# Patient Record
Sex: Female | Born: 1960 | Race: White | Hispanic: No | Marital: Single | Smoking: Former smoker
Health system: Southern US, Community
[De-identification: ages and names within clinical notes are randomized; demographics above are authoritative.]

## PROBLEM LIST (undated history)

## (undated) DIAGNOSIS — M1990 Unspecified osteoarthritis, unspecified site: Secondary | ICD-10-CM

## (undated) DIAGNOSIS — G4700 Insomnia, unspecified: Secondary | ICD-10-CM

## (undated) DIAGNOSIS — Z87898 Personal history of other specified conditions: Secondary | ICD-10-CM

## (undated) DIAGNOSIS — R42 Dizziness and giddiness: Principal | ICD-10-CM

## (undated) DIAGNOSIS — R112 Nausea with vomiting, unspecified: Secondary | ICD-10-CM

## (undated) DIAGNOSIS — M797 Fibromyalgia: Secondary | ICD-10-CM

## (undated) DIAGNOSIS — Z9889 Other specified postprocedural states: Secondary | ICD-10-CM

## (undated) DIAGNOSIS — R0989 Other specified symptoms and signs involving the circulatory and respiratory systems: Secondary | ICD-10-CM

## (undated) DIAGNOSIS — T7840XA Allergy, unspecified, initial encounter: Secondary | ICD-10-CM

## (undated) HISTORY — DX: Personal history of other specified conditions: Z87.898

## (undated) HISTORY — DX: Allergy, unspecified, initial encounter: T78.40XA

## (undated) HISTORY — DX: Insomnia, unspecified: G47.00

## (undated) HISTORY — DX: Dizziness and giddiness: R42

## (undated) HISTORY — DX: Unspecified osteoarthritis, unspecified site: M19.90

## (undated) HISTORY — DX: Fibromyalgia: M79.7

---

## 1988-01-10 HISTORY — PX: TMJ ARTHROPLASTY: SHX1066

## 1997-09-22 ENCOUNTER — Other Ambulatory Visit: Admission: RE | Admit: 1997-09-22 | Discharge: 1997-09-22 | Payer: Self-pay | Admitting: Obstetrics and Gynecology

## 1997-10-17 ENCOUNTER — Emergency Department (HOSPITAL_COMMUNITY): Admission: EM | Admit: 1997-10-17 | Discharge: 1997-10-17 | Payer: Self-pay | Admitting: Internal Medicine

## 1998-02-14 ENCOUNTER — Inpatient Hospital Stay (HOSPITAL_COMMUNITY): Admission: AD | Admit: 1998-02-14 | Discharge: 1998-02-14 | Payer: Self-pay | Admitting: *Deleted

## 1998-02-14 ENCOUNTER — Encounter: Payer: Self-pay | Admitting: *Deleted

## 1998-04-12 ENCOUNTER — Inpatient Hospital Stay (HOSPITAL_COMMUNITY): Admission: AD | Admit: 1998-04-12 | Discharge: 1998-04-15 | Payer: Self-pay | Admitting: Obstetrics and Gynecology

## 1998-04-13 ENCOUNTER — Encounter (HOSPITAL_COMMUNITY): Admission: RE | Admit: 1998-04-13 | Discharge: 1998-07-12 | Payer: Self-pay | Admitting: Obstetrics and Gynecology

## 1998-04-22 ENCOUNTER — Encounter: Payer: Self-pay | Admitting: Obstetrics and Gynecology

## 1998-04-22 ENCOUNTER — Ambulatory Visit (HOSPITAL_COMMUNITY): Admission: RE | Admit: 1998-04-22 | Discharge: 1998-04-22 | Payer: Self-pay | Admitting: Obstetrics and Gynecology

## 1998-05-06 ENCOUNTER — Other Ambulatory Visit: Admission: RE | Admit: 1998-05-06 | Discharge: 1998-05-06 | Payer: Self-pay | Admitting: Obstetrics and Gynecology

## 1998-11-24 ENCOUNTER — Encounter: Payer: Self-pay | Admitting: Internal Medicine

## 1998-11-24 ENCOUNTER — Ambulatory Visit (HOSPITAL_COMMUNITY): Admission: RE | Admit: 1998-11-24 | Discharge: 1998-11-24 | Payer: Self-pay | Admitting: Internal Medicine

## 1999-05-27 ENCOUNTER — Other Ambulatory Visit: Admission: RE | Admit: 1999-05-27 | Discharge: 1999-05-27 | Payer: Self-pay | Admitting: Obstetrics and Gynecology

## 2000-06-05 ENCOUNTER — Other Ambulatory Visit: Admission: RE | Admit: 2000-06-05 | Discharge: 2000-06-05 | Payer: Self-pay | Admitting: *Deleted

## 2001-06-14 ENCOUNTER — Other Ambulatory Visit: Admission: RE | Admit: 2001-06-14 | Discharge: 2001-06-14 | Payer: Self-pay | Admitting: Obstetrics and Gynecology

## 2001-09-03 ENCOUNTER — Encounter: Admission: RE | Admit: 2001-09-03 | Discharge: 2001-09-03 | Payer: Self-pay | Admitting: Internal Medicine

## 2001-09-03 ENCOUNTER — Encounter: Payer: Self-pay | Admitting: Internal Medicine

## 2001-12-10 ENCOUNTER — Ambulatory Visit (HOSPITAL_COMMUNITY): Admission: RE | Admit: 2001-12-10 | Discharge: 2001-12-10 | Payer: Self-pay | Admitting: *Deleted

## 2001-12-10 ENCOUNTER — Encounter: Payer: Self-pay | Admitting: Orthopedic Surgery

## 2002-12-10 ENCOUNTER — Other Ambulatory Visit: Admission: RE | Admit: 2002-12-10 | Discharge: 2002-12-10 | Payer: Self-pay | Admitting: Obstetrics and Gynecology

## 2003-05-12 ENCOUNTER — Other Ambulatory Visit: Admission: RE | Admit: 2003-05-12 | Discharge: 2003-05-12 | Payer: Self-pay | Admitting: Obstetrics and Gynecology

## 2004-06-20 ENCOUNTER — Other Ambulatory Visit: Admission: RE | Admit: 2004-06-20 | Discharge: 2004-06-20 | Payer: Self-pay | Admitting: Obstetrics and Gynecology

## 2007-08-07 ENCOUNTER — Encounter: Admission: RE | Admit: 2007-08-07 | Discharge: 2007-08-07 | Payer: Self-pay | Admitting: Neurosurgery

## 2010-01-30 ENCOUNTER — Encounter: Payer: Self-pay | Admitting: Family Medicine

## 2010-03-28 ENCOUNTER — Other Ambulatory Visit: Payer: Self-pay | Admitting: Orthopedic Surgery

## 2010-03-28 DIAGNOSIS — M542 Cervicalgia: Secondary | ICD-10-CM

## 2010-03-28 DIAGNOSIS — M25512 Pain in left shoulder: Secondary | ICD-10-CM

## 2010-04-02 ENCOUNTER — Ambulatory Visit
Admission: RE | Admit: 2010-04-02 | Discharge: 2010-04-02 | Disposition: A | Discharge status: Home / Self Care | Payer: BC Managed Care – PPO | Source: Ambulatory Visit | Attending: Orthopedic Surgery | Admitting: Orthopedic Surgery

## 2010-04-02 DIAGNOSIS — M542 Cervicalgia: Secondary | ICD-10-CM

## 2010-04-02 DIAGNOSIS — M25512 Pain in left shoulder: Secondary | ICD-10-CM

## 2010-04-28 ENCOUNTER — Encounter (HOSPITAL_BASED_OUTPATIENT_CLINIC_OR_DEPARTMENT_OTHER)
Admission: RE | Admit: 2010-04-28 | Discharge: 2010-04-28 | Disposition: A | Discharge status: Home / Self Care | Payer: BC Managed Care – PPO | Source: Ambulatory Visit | Attending: Orthopaedic Surgery | Admitting: Orthopaedic Surgery

## 2010-05-03 ENCOUNTER — Ambulatory Visit (HOSPITAL_BASED_OUTPATIENT_CLINIC_OR_DEPARTMENT_OTHER)
Admission: RE | Admit: 2010-05-03 | Discharge: 2010-05-04 | Disposition: A | Discharge status: Home / Self Care | Payer: BC Managed Care – PPO | Source: Ambulatory Visit | Attending: Orthopaedic Surgery | Admitting: Orthopaedic Surgery

## 2010-05-03 DIAGNOSIS — M25819 Other specified joint disorders, unspecified shoulder: Secondary | ICD-10-CM | POA: Insufficient documentation

## 2010-05-03 DIAGNOSIS — Z01812 Encounter for preprocedural laboratory examination: Secondary | ICD-10-CM | POA: Insufficient documentation

## 2010-05-03 DIAGNOSIS — M7530 Calcific tendinitis of unspecified shoulder: Secondary | ICD-10-CM | POA: Insufficient documentation

## 2010-05-03 HISTORY — PX: SHOULDER SURGERY: SHX246

## 2010-05-10 NOTE — Op Note (Signed)
Patricia Lloyd, Patricia Lloyd            ACCOUNT NO.:  [id number]  MEDICAL RECORD NO.:  [id number]            PATIENT TYPE:  LOCATION:                                 FACILITY:  PHYSICIAN:  Patricia Lloyd. Patricia Lloyd, M.D.     DATE OF BIRTH:  DATE OF PROCEDURE:  05/03/2010 DATE OF DISCHARGE:                              OPERATIVE REPORT   PREOPERATIVE DIAGNOSES: 1. Left shoulder impingement. 2. Left shoulder calcific tendonitis.  POSTOPERATIVE DIAGNOSES: 1. Left shoulder impingement. 2. Left shoulder calcific tendonitis.  PROCEDURES: 1. Left shoulder acromioplasty. 2. Left shoulder debridement. 3. Left shoulder arthroscopic rotator cuff repair.  ANESTHESIA:  General and block.  ATTENDING SURGEON:  Patricia Lloyd. Patricia Santos, MD  ASSISTANT:  Lindwood Qua, PA   INDICATIONS FOR PROCEDURE:  The patient is a 50 year old woman with about 10 years of left shoulder pain.  This has persisted despite multiple injections and bouts of physical therapy.  She has had ultrasound and MRI evaluation.  She has a calcific deposit in her rotator cuff and pain, which limits her ability to use her arm and rest.  At this point, she is offered arthroscopic intervention.  Informed operative consent was obtained after discussion of possible complications including reaction to anesthesia and infection.  SUMMARY OF FINDINGS AND PROCEDURE:  Under general anesthesia and a block, a left shoulder arthroscopy was performed.  The glenohumeral joint showed no degenerative changes and the biceps tendon and rotator cuff appeared benign from below.  In the subacromial space, she had a great deal of bursitis and an obvious calcific deposit in the rotator cuff at the supraspinatus attachment.  She had some mild impingement related to the shape of her acromion and an acromioplasty was done.  I then removed the large calcific deposit in the supraspinatus and this left her with a small rotator cuff tear, which I  repaired arthroscopically.  [name] assisted throughout and was invaluable to the completion of the case, in that, he helped pass instruments and make this possible in an arthroscopic fashion.  DESCRIPTION OF PROCEDURE:  The patient was taken to the operating suite where general anesthetic was applied without difficulty.  She was also given a block in the preanesthesia area.  She was positioned in beach- chair position and prepped and draped in normal sterile fashion.  After administration of IV Kefzol, an arthroscopy of the left shoulder was performed through total of three portals.  Findings were as noted above and procedure consisted of the acromioplasty done with a bur in the lateral position followed by transfer of the bur to the posterior position.  We then performed resection of the calcific deposit in the supraspinatus and did leave her with a small rotator cuff tear about a centimeter in size.  This was then reapproximated to a bleeding bed of bone with a reverse mattress suture of #2 FiberWire and a single PushLock anchor by [employer].  This seemed to give [place] a nice repair under no significant tension.  The shoulder was thoroughly irrigated followed by reapproximation of portals loosely with nylon.  Adaptic was applied followed by dry gauze and paper tape.  Estimated blood loss and intraoperative fluids can be obtained from anesthesia records.  DISPOSITION:  The patient was extubated in the operating room and taken to recovery room in stable addition.  Plans for her to stay overnight for pain control with probable discharge home in the morning.     Patricia Lloyd Patricia Lloyd, M.D.     PGD/MEDQ  D:  05/03/2010  T:  05/04/2010  Job:  [id number]  Electronically Signed by Marcene Corning M.D. on 05/10/2010 02:27:07 PM

## 2011-01-04 ENCOUNTER — Other Ambulatory Visit: Payer: Self-pay | Admitting: Internal Medicine

## 2011-01-04 ENCOUNTER — Ambulatory Visit
Admission: RE | Admit: 2011-01-04 | Discharge: 2011-01-04 | Disposition: A | Discharge status: Home / Self Care | Payer: BC Managed Care – PPO | Source: Ambulatory Visit | Attending: Internal Medicine | Admitting: Internal Medicine

## 2011-01-04 DIAGNOSIS — R059 Cough, unspecified: Secondary | ICD-10-CM

## 2011-01-04 DIAGNOSIS — R05 Cough: Secondary | ICD-10-CM

## 2011-11-09 LAB — HM MAMMOGRAPHY: HM Mammogram: NORMAL

## 2012-05-14 ENCOUNTER — Ambulatory Visit (INDEPENDENT_AMBULATORY_CARE_PROVIDER_SITE_OTHER): Payer: BC Managed Care – PPO | Admitting: *Deleted

## 2012-05-14 DIAGNOSIS — I781 Nevus, non-neoplastic: Secondary | ICD-10-CM

## 2012-05-14 NOTE — Progress Notes (Signed)
Tried to lase some tiny vessels where other vein centers have tried and failed. Used 3.0 stregth. Will see how they do and will follow her prn. She did appear to have a nice local reaction so hoping for good results.

## 2012-09-27 ENCOUNTER — Other Ambulatory Visit: Payer: Self-pay | Admitting: Internal Medicine

## 2012-09-27 DIAGNOSIS — G4700 Insomnia, unspecified: Secondary | ICD-10-CM

## 2012-09-27 MED ORDER — ZOLPIDEM TARTRATE 5 MG PO TABS: ORAL_TABLET | ORAL | Status: DC

## 2012-10-02 ENCOUNTER — Ambulatory Visit (INDEPENDENT_AMBULATORY_CARE_PROVIDER_SITE_OTHER): Payer: BC Managed Care – PPO | Admitting: Nurse Practitioner

## 2012-10-02 VITALS — BP 126/74 | HR 56 | Temp 98.1°F | Resp 14 | Wt 127.8 lb

## 2012-10-02 DIAGNOSIS — G4700 Insomnia, unspecified: Secondary | ICD-10-CM

## 2012-10-02 DIAGNOSIS — F411 Generalized anxiety disorder: Secondary | ICD-10-CM

## 2012-10-02 DIAGNOSIS — J329 Chronic sinusitis, unspecified: Secondary | ICD-10-CM

## 2012-10-02 MED ORDER — ESCITALOPRAM OXALATE 10 MG PO TABS: 10.0000 mg | ORAL_TABLET | Freq: Every day | ORAL | Status: DC

## 2012-10-02 MED ORDER — AMOXICILLIN 500 MG PO CAPS: 500.0000 mg | ORAL_CAPSULE | Freq: Three times a day (TID) | ORAL | Status: DC

## 2012-10-02 MED ORDER — ALPRAZOLAM 0.25 MG PO TABS: 0.2500 mg | ORAL_TABLET | Freq: Two times a day (BID) | ORAL | Status: DC | PRN

## 2012-10-02 NOTE — Patient Instructions (Addendum)
Amoxicillin faxed into pharmacy- to take three times daily for 1 week Also to take probiotic or eat yogurt daily while on antibiotic   Sinusitis Sinusitis is redness, soreness, and swelling (inflammation) of the paranasal sinuses. Paranasal sinuses are air pockets within the bones of your face (beneath the eyes, the middle of the forehead, or above the eyes). In healthy paranasal sinuses, mucus is able to drain out, and air is able to circulate through them by way of your nose. However, when your paranasal sinuses are inflamed, mucus and air can become trapped. This can allow bacteria and other germs to grow and cause infection. Sinusitis can develop quickly and last only a short time (acute) or continue over a long period (chronic). Sinusitis that lasts for more than 12 weeks is considered chronic.  CAUSES  Causes of sinusitis include:  Allergies.  Structural abnormalities, such as displacement of the cartilage that separates your nostrils (deviated septum), which can decrease the air flow through your nose and sinuses and affect sinus drainage.  Functional abnormalities, such as when the small hairs (cilia) that line your sinuses and help remove mucus do not work properly or are not present. SYMPTOMS  Symptoms of acute and chronic sinusitis are the same. The primary symptoms are pain and pressure around the affected sinuses. Other symptoms include:  Upper toothache.  Earache.  Headache.  Bad breath.  Decreased sense of smell and taste.  A cough, which worsens when you are lying flat.  Fatigue.  Fever.  Thick drainage from your nose, which often is green and may contain pus (purulent).  Swelling and warmth over the affected sinuses. DIAGNOSIS  Your caregiver will perform a physical exam. During the exam, your caregiver may:  Look in your nose for signs of abnormal growths in your nostrils (nasal polyps).  Tap over the affected sinus to check for signs of infection.  View  the inside of your sinuses (endoscopy) with a special imaging device with a light attached (endoscope), which is inserted into your sinuses. If your caregiver suspects that you have chronic sinusitis, one or more of the following tests may be recommended:  Allergy tests.  Nasal culture A sample of mucus is taken from your nose and sent to a lab and screened for bacteria.  Nasal cytology A sample of mucus is taken from your nose and examined by your caregiver to determine if your sinusitis is related to an allergy. TREATMENT  Most cases of acute sinusitis are related to a viral infection and will resolve on their own within 10 days. Sometimes medicines are prescribed to help relieve symptoms (pain medicine, decongestants, nasal steroid sprays, or saline sprays).  However, for sinusitis related to a bacterial infection, your caregiver will prescribe antibiotic medicines. These are medicines that will help kill the bacteria causing the infection.  Rarely, sinusitis is caused by a fungal infection. In theses cases, your caregiver will prescribe antifungal medicine. For some cases of chronic sinusitis, surgery is needed. Generally, these are cases in which sinusitis recurs more than 3 times per year, despite other treatments. HOME CARE INSTRUCTIONS   Drink plenty of water. Water helps thin the mucus so your sinuses can drain more easily.  Use a humidifier.  Inhale steam 3 to 4 times a day (for example, sit in the bathroom with the shower running).  Apply a warm, moist washcloth to your face 3 to 4 times a day, or as directed by your caregiver.  Use saline nasal sprays to help  moisten and clean your sinuses.  Take over-the-counter or prescription medicines for pain, discomfort, or fever only as directed by your caregiver. SEEK IMMEDIATE MEDICAL CARE IF:  You have increasing pain or severe headaches.  You have nausea, vomiting, or drowsiness.  You have swelling around your face.  You have  vision problems.  You have a stiff neck.  You have difficulty breathing. MAKE SURE YOU:   Understand these instructions.  Will watch your condition.  Will get help right away if you are not doing well or get worse. Document Released: 12/26/2004 Document Revised: 03/20/2011 Document Reviewed: 01/10/2011 [facility] Patient Information 2014 [place], [place].    Anxiety and Panic Attacks Your caregiver has informed you that you are having an anxiety or panic attack. There may be many forms of this. Most of the time these attacks come suddenly and without warning. They come at any time of day, including periods of sleep, and at any time of life. They may be strong and unexplained. Although panic attacks are very scary, they are physically harmless. Sometimes the cause of your anxiety is not known. Anxiety is a protective mechanism of the body in its fight or flight mechanism. Most of these perceived danger situations are actually nonphysical situations (such as anxiety over losing a job). CAUSES  The causes of an anxiety or panic attack are many. Panic attacks may occur in otherwise healthy people given a certain set of circumstances. There may be a genetic cause for panic attacks. Some medications may also have anxiety as a side effect. SYMPTOMS  Some of the most common feelings are:  Intense terror.  Dizziness, feeling faint.  Hot and cold flashes.  Fear of going crazy.  Feelings that nothing is real.  Sweating.  Shaking.  Chest pain or a fast heartbeat (palpitations).  Smothering, choking sensations.  Feelings of impending doom and that death is near.  Tingling of extremities, this may be from over-breathing.  Altered reality (derealization).  Being detached from yourself (depersonalization). Several symptoms can be present to make up anxiety or panic attacks. DIAGNOSIS  The evaluation by your caregiver will depend on the type of symptoms you are experiencing. The  diagnosis of anxiety or panic attack is made when no physical illness can be determined to be a cause of the symptoms. TREATMENT  Treatment to prevent anxiety and panic attacks may include:  Avoidance of circumstances that cause anxiety.  Reassurance and relaxation.  Regular exercise.  Relaxation therapies, such as yoga.  Psychotherapy with a psychiatrist or therapist.  Avoidance of caffeine, alcohol and illegal drugs.  Prescribed medication. SEEK IMMEDIATE MEDICAL CARE IF:   You experience panic attack symptoms that are different than your usual symptoms.  You have any worsening or concerning symptoms. Document Released: 12/26/2004 Document Revised: 03/20/2011 Document Reviewed: 04/29/2009 [facility] Patient Information 2014 [place], [place].

## 2012-10-02 NOTE — Progress Notes (Signed)
Patient ID: Patricia Lloyd, female   DOB: 29-Jul-1960, 52 y.o.   MRN: [id number]   No Known Allergies  Chief Complaint  Patient presents with  . Otalgia    Left side earache. Wants anxiety medication due to going through a sepration.    HPI: Patient is a 52 y.o. female seen in the office today for left ear Has been having increased anxiety; leaving husband of 25 years; has been given Ambien for sleep due to not getting any sleep; Ambien works some but has still been waking up in the early morning.  Left sided earache- for 1 week; also has nasal congestion and Pain and feels like she can not get her ear pop; side of throat tender, has cough and nasal drainage No fever or chills no shortness of breath  Review of Systems:  Review of Systems  Constitutional: Positive for malaise/fatigue (fatigue due to lack of sleep.). Negative for fever and chills.  HENT: Positive for ear pain and sore throat. Negative for ear discharge.   Respiratory: Positive for cough. Negative for sputum production and shortness of breath.   Cardiovascular: Negative for chest pain and palpitations.  Skin: Negative.   Psychiatric/Behavioral: Negative for suicidal ideas. The patient is nervous/anxious and has insomnia.     Medications: Patient's Medications  New Prescriptions   No medications on file  Previous Medications   ZOLPIDEM (AMBIEN) 5 MG TABLET    One nightly if needed for sleep  Modified Medications   No medications on file  Discontinued Medications   ESCITALOPRAM (LEXAPRO) 20 MG TABLET       METHYLPREDNISOLONE (MEDROL DOSPACK) 4 MG TABLET       OXYCODONE-ACETAMINOPHEN (PERCOCET) 10-325 MG PER TABLET       TRAMADOL (ULTRAM) 50 MG TABLET         Physical Exam:  Filed Vitals:   10/02/12 1313  BP: 126/74  Pulse: 56  Temp: 98.1 F (36.7 C)  TempSrc: Oral  Resp: 14  Weight: 127 lb 12.8 oz (57.97 kg)   Physical Exam  Constitutional: She is well-developed, well-nourished, and in no distress.  No distress.  HENT:  Head: Normocephalic and atraumatic.  Right Ear: Tympanic membrane, external ear and ear canal normal.  Left Ear: Tympanic membrane and ear canal normal. There is tenderness.  Nose: Nose normal.  Mouth/Throat: Oropharynx is clear and moist. No oropharyngeal exudate.  Neck: Normal range of motion. Neck supple. No thyromegaly present.  Cardiovascular: Normal rate, regular rhythm and normal heart sounds.   Pulmonary/Chest: Effort normal and breath sounds normal. No respiratory distress.  Lymphadenopathy:    She has no cervical adenopathy.  Skin: She is not diaphoretic.     Assessment/Plan 1. Sinusitis Acute sinusitis; will treated with amoxicillin for 7 days; pt to follow up if no improvement after treatment or symptoms get worse while on antibiotic or after completion of antibiotic To take with a probiotic or yogurt daily  - amoxicillin (AMOXIL) 500 MG capsule; Take 1 capsule (500 mg total) by mouth 3 (three) times daily.  Dispense: 21 capsule; Refill: 0  2. Generalized anxiety disorder Exacerbated due to separation; has been on lexapro before when her mother was sick; will start today; advised pt to call if she is having undesirable side effects. Will have her follow up in 4-6 weeks.  May also take xanax 0.25 mg twice daily as needed for worsening anxiety/anxiety attacks  - escitalopram (LEXAPRO) 10 MG tablet; Take 1 tablet (10 mg total) by mouth daily.  Dispense: 30 tablet; Refill: 1 - ALPRAZolam (XANAX) 0.25 MG tablet; Take 1 tablet (0.25 mg total) by mouth 2 (two) times daily as needed for anxiety.  Dispense: 20 tablet; Refill: 0  3. Insomnia Ambien only working part of the time. May try melatonin 3 mg at night before bed. once anxiety gets better controlled sleep will improve.  To cont Ambien as needed may use 1/2 tablet if she feels like she is taking too much with celexa

## 2012-10-14 ENCOUNTER — Telehealth: Payer: Self-pay | Admitting: *Deleted

## 2012-10-14 NOTE — Telephone Encounter (Signed)
Patient called and stated that her dizziness is no better and wants a referral to Dr. Annalee Genta. Please Advise.

## 2012-10-15 NOTE — Telephone Encounter (Signed)
Candelaria Celeste, NP called into the office to indicate that patient never complained of dizziness when seen on 10/02/2012. Message will be forwarded to Dr.Green for further review and recommendations   Dr.Green please advise on patient's request for referral to ENT (message printed and placed on ledge)

## 2012-10-16 ENCOUNTER — Other Ambulatory Visit: Payer: Self-pay | Admitting: *Deleted

## 2012-10-16 DIAGNOSIS — R42 Dizziness and giddiness: Secondary | ICD-10-CM

## 2012-10-16 NOTE — Telephone Encounter (Signed)
It  Is OK to refer her to Dr. Annalee Genta. I tried to call her 10/15/12, but did not receive a return call.

## 2012-10-16 NOTE — Telephone Encounter (Signed)
Spoke with Thayer Ohm at [facility] E[facility] # [phone] and I have to fax office notes before they will schedule an appointment. Faxed information to Fax # [phone]. They will call patient with an appointment. Called patient and informed her.

## 2012-10-30 ENCOUNTER — Ambulatory Visit: Payer: BC Managed Care – PPO | Admitting: Nurse Practitioner

## 2012-11-27 ENCOUNTER — Encounter: Payer: Self-pay | Admitting: Nurse Practitioner

## 2012-11-27 ENCOUNTER — Ambulatory Visit (INDEPENDENT_AMBULATORY_CARE_PROVIDER_SITE_OTHER): Payer: BC Managed Care – PPO | Admitting: Nurse Practitioner

## 2012-11-27 VITALS — BP 110/68 | HR 82 | Temp 95.6°F | Wt 128.2 lb

## 2012-11-27 DIAGNOSIS — R197 Diarrhea, unspecified: Secondary | ICD-10-CM

## 2012-11-27 DIAGNOSIS — G4700 Insomnia, unspecified: Secondary | ICD-10-CM

## 2012-11-27 DIAGNOSIS — R42 Dizziness and giddiness: Secondary | ICD-10-CM

## 2012-11-27 MED ORDER — MECLIZINE HCL 50 MG PO TABS: ORAL_TABLET | ORAL | Status: DC

## 2012-11-27 MED ORDER — ZOLPIDEM TARTRATE 5 MG PO TABS: ORAL_TABLET | ORAL | Status: DC

## 2012-11-27 NOTE — Progress Notes (Signed)
Patient ID: Patricia Lloyd, female   DOB: [birth year], 52 y.o.   MRN: [id number]   No Known Allergies  Chief Complaint  Patient presents with  . Medical Managment of Chronic Issues    4 week follow-up   . Unable to focus    patient is unable to focus on reading and everything looks blurry. Patient seen ENT   . Sleeping Problem    patient with ongoing concerns about staying asleep. Patient is under a lot of stress     HPI: Patient is a 51 y.o. female seen in the office today for follow up;  Reports worsening vertigo- since the first week in October; always dizzy and having to hold on to things Went to ENT and they did not find anything; had vertigo twice before in her life; ENT said it was not BPPV; ENT has her using saline into her nose which is not helping Rooms spinning worse with movement  Was started on lexapro at last visit and xanax - did not start lexapro and has only taken 3 xanax Is on hormone replacement ?that as the cause however she has been on this for over a year Every keeps telling her it is due to her not sleeping; she is very active during the day, uses mediatation  Stress is bad due to separation; husband is making it more difficult than it should be and anticipates this getting worse, tries to exercises but gets dizzy on the machines, has used stationary bike.    Blurred vision.  Review of Systems:  Review of Systems  Constitutional: Positive for malaise/fatigue. Negative for fever and chills.  HENT: Positive for congestion. Negative for ear pain, hearing loss, sore throat and tinnitus.   Eyes: Positive for blurred vision. Negative for double vision, pain and redness.  Respiratory: Positive for cough (with drainage with the weather change). Negative for shortness of breath.   Cardiovascular: Positive for leg swelling (worse). Negative for chest pain and palpitations.       Reports she can hear her heartbeat in her ears  Gastrointestinal: Positive for diarrhea (from  stress). Negative for heartburn, nausea and vomiting.  Genitourinary: Negative for dysuria, urgency and frequency.  Musculoskeletal: Negative for joint pain and myalgias.  Skin: Negative.   Neurological: Positive for dizziness. Negative for tingling, focal weakness, weakness and headaches.       Hands feel numb at times  Psychiatric/Behavioral: The patient is nervous/anxious and has insomnia.      History reviewed. No pertinent past medical history. History reviewed. No pertinent past surgical history. Social History:   reports that she has never smoked. She does not have any smokeless tobacco history on file. She reports that she drinks alcohol. She reports that she does not use illicit drugs.  History reviewed. No pertinent family history.  Medications: Patient's Medications  New Prescriptions   No medications on file  Previous Medications   ALPRAZOLAM (XANAX) 0.25 MG TABLET    Take 1 tablet (0.25 mg total) by mouth 2 (two) times daily as needed for anxiety.   AMBULATORY NON FORMULARY MEDICATION    Medication Name: HRT- compound formula, RX'ed by Dr.Holland   ESCITALOPRAM (LEXAPRO) 10 MG TABLET    Take 1 tablet (10 mg total) by mouth daily.   ZOLPIDEM (AMBIEN) 5 MG TABLET    One nightly if needed for sleep  Modified Medications   No medications on file  Discontinued Medications   AMOXICILLIN (AMOXIL) 500 MG CAPSULE    Take 1  capsule (500 mg total) by mouth 3 (three) times daily.     Physical Exam:  Filed Vitals:   11/27/12 0858  BP: 110/68  Pulse: 82  Temp: 95.6 F (35.3 C)  TempSrc: Oral  Weight: 128 lb 3.2 oz (58.151 kg)  SpO2: 99%    Physical Exam  Constitutional: She is oriented to person, place, and time and well-developed, well-nourished, and in no distress. No distress.  HENT:  Head: Normocephalic and atraumatic.  Right Ear: External ear normal.  Left Ear: External ear normal.  Nose: Nose normal.  Mouth/Throat: Oropharynx is clear and moist. No  oropharyngeal exudate.  Eyes: Conjunctivae and EOM are normal. Pupils are equal, round, and reactive to light.  Neck: Normal range of motion. Neck supple. No JVD present. No thyromegaly present.  Cardiovascular: Normal rate, regular rhythm, normal heart sounds and intact distal pulses.   Pulmonary/Chest: Effort normal and breath sounds normal. No respiratory distress. She has no wheezes.  Abdominal: Soft. Bowel sounds are normal. She exhibits no distension. There is no tenderness.  Musculoskeletal: Normal range of motion. She exhibits no edema and no tenderness.  Lymphadenopathy:    She has no cervical adenopathy.  Neurological: She is alert and oriented to person, place, and time. She has normal strength, normal reflexes and intact cranial nerves. She displays normal reflexes. A sensory deficit (to left upper fingers (decreased)) is present. No cranial nerve deficit. Gait normal. Gait normal.  Skin: Skin is warm and dry. She is not diaphoretic.  Psychiatric: Affect normal.   Assessment/Plan 1. Dizziness -worse; no findings at ENT has follow up -will get blood work today - CBC With differential/Platelet - TSH - Comprehensive metabolic panel - meclizine (ANTIVERT) 50 MG tablet; Take 1/2 - 1 tablet every 8 hours as needed for dizziness  Dispense: 90 tablet; Refill: 0   2. Diarrhea -pt reports she contributes this to stress; however it is ongoing -bland diet advanced as tolerated -increase water intake - Amylase - Lipase  3. Insomnia, unspecified -cont meditation, exercise and routine before bed - zolpidem (AMBIEN) 5 MG tablet; 1-2 nightly if needed for sleep  (however only to use 2 if this significantly helps sleep; due to extreme lack of sleep) pt agrees, she trys not to take medication unless unavoidable

## 2012-11-28 ENCOUNTER — Other Ambulatory Visit: Payer: Self-pay | Admitting: Nurse Practitioner

## 2012-11-28 DIAGNOSIS — R42 Dizziness and giddiness: Secondary | ICD-10-CM

## 2012-11-28 LAB — COMPREHENSIVE METABOLIC PANEL
AST: 21 IU/L (ref 0–40)
Albumin/Globulin Ratio: 1.6 (ref 1.1–2.5)
Albumin: 4.4 g/dL (ref 3.5–5.5)
BUN: 12 mg/dL (ref 6–24)
CO2: 26 mmol/L (ref 18–29)
Calcium: 9.2 mg/dL (ref 8.7–10.2)
Creatinine, Ser: 0.8 mg/dL (ref 0.57–1.00)
GFR calc non Af Amer: 84 mL/min/{1.73_m2} (ref >59)
Globulin, Total: 2.8 g/dL (ref 1.5–4.5)
Glucose: 82 mg/dL (ref 65–99)
Sodium: 143 mmol/L (ref 134–144)
Total Protein: 7.2 g/dL (ref 6.0–8.5)

## 2012-11-28 LAB — CBC WITH DIFFERENTIAL
Basos: 1 %
Eos: 3 %
HCT: 39.6 % (ref 34.0–46.6)
Hemoglobin: 13.4 g/dL (ref 11.1–15.9)
Lymphocytes Absolute: 1.9 10*3/uL (ref 0.7–3.1)
Lymphs: 31 %
MCV: 92 fL (ref 79–97)
Monocytes: 7 %
Neutrophils Absolute: 3.6 10*3/uL (ref 1.4–7.0)
RDW: 13.6 % (ref 12.3–15.4)
WBC: 6.2 10*3/uL (ref 3.4–10.8)

## 2012-11-28 LAB — TSH: TSH: 2.2 u[IU]/mL (ref 0.450–4.500)

## 2012-11-28 LAB — LIPASE: Lipase: 17 U/L (ref 0–59)

## 2012-12-03 ENCOUNTER — Encounter: Payer: Self-pay | Admitting: Internal Medicine

## 2012-12-03 ENCOUNTER — Ambulatory Visit
Admission: RE | Admit: 2012-12-03 | Discharge: 2012-12-03 | Disposition: A | Discharge status: Home / Self Care | Payer: BC Managed Care – PPO | Source: Ambulatory Visit | Attending: Nurse Practitioner | Admitting: Nurse Practitioner

## 2012-12-03 DIAGNOSIS — R42 Dizziness and giddiness: Secondary | ICD-10-CM

## 2012-12-03 MED ORDER — GADOBENATE DIMEGLUMINE 529 MG/ML IV SOLN
10.0000 mL | Freq: Once | INTRAVENOUS | Status: AC | PRN
  Administered 2012-12-03: 10 mL via INTRAVENOUS

## 2012-12-04 ENCOUNTER — Other Ambulatory Visit: Payer: Self-pay | Admitting: Nurse Practitioner

## 2012-12-04 DIAGNOSIS — R42 Dizziness and giddiness: Secondary | ICD-10-CM

## 2012-12-09 ENCOUNTER — Encounter: Payer: Self-pay | Admitting: Internal Medicine

## 2012-12-10 ENCOUNTER — Telehealth: Payer: Self-pay | Admitting: Vascular Surgery

## 2012-12-10 NOTE — Telephone Encounter (Signed)
Patricia Lloyd needs a bilat reflux study and JDL. She can't come 12/9. How soon could she get in? [phone] DOB: 04/09/1960    12/10/12: spoke with pt to schedule. Offered 01/13- pt unavailable- set appt for 01/28/13, dpm

## 2012-12-11 ENCOUNTER — Other Ambulatory Visit: Payer: Self-pay | Admitting: Vascular Surgery

## 2012-12-12 ENCOUNTER — Other Ambulatory Visit: Payer: Self-pay | Admitting: *Deleted

## 2012-12-12 DIAGNOSIS — I83893 Varicose veins of bilateral lower extremities with other complications: Secondary | ICD-10-CM

## 2012-12-25 ENCOUNTER — Ambulatory Visit (INDEPENDENT_AMBULATORY_CARE_PROVIDER_SITE_OTHER): Payer: BC Managed Care – PPO | Admitting: Neurology

## 2012-12-25 ENCOUNTER — Encounter: Payer: Self-pay | Admitting: Neurology

## 2012-12-25 ENCOUNTER — Encounter (INDEPENDENT_AMBULATORY_CARE_PROVIDER_SITE_OTHER): Payer: Self-pay

## 2012-12-25 VITALS — BP 116/80 | HR 66 | Ht 64.5 in | Wt 128.0 lb

## 2012-12-25 DIAGNOSIS — R42 Dizziness and giddiness: Secondary | ICD-10-CM

## 2012-12-25 MED ORDER — TOPIRAMATE 25 MG PO TABS: ORAL_TABLET | ORAL | Status: DC

## 2012-12-25 NOTE — Patient Instructions (Signed)

## 2012-12-25 NOTE — Progress Notes (Signed)
Reason for visit: Dizziness  Patricia Lloyd is a 52 y.o. female  History of present illness:  Patricia Lloyd is a 52 year old right-handed white female with a history of benign positional vertigo in the past, with the last episode occurring around 5 years ago. The patient had true vertigo at that time, and she responded to the Epley maneuvers. The patient began having dizziness suddenly while traveling in October 2014. The patient was getting out of cab, and she had sudden onset of a lightheaded sensation that has been persistent since that time. The patient has had dizziness every day, usually worse in the morning and in the evenings, better in the midday. The patient reports a lightheaded, floaty sensation, no true vertigo. The patient may feel somewhat nauseated, but she does not vomit. The patient has a clouding of sensorium, with difficulty with concentration. The patient has a pressure sensation over the left eye, and some scalp tenderness. The patient denies any prior history of migraine headache. The patient reports some occasional tingling sensations in the hands bilaterally since the dizziness started. The patient reports that she is under some stress, as she is going through a separation. The patient has alprazolam to take, but she has not taken this medication recently. The patient indicates that she has not had any change in her medication regimen recently. The patient has had episodes of near-syncope sensation, without loss of consciousness. The patient denies any problems controlling the bowels or the bladder, but she does have some mild gait instability. The patient denies any significant neck discomfort, and she denies any sinus drainage. MRI evaluation of the brain was done, and I have reviewed the report and the images on line. The patient has very minimal periventricular white matter changes. The report indicates that there is some pontine involvement as well, but I do not see this  on the study. The patient has been seen by an ENT physician, and she was not felt to have benign positional vertigo at this time. The patient is sent to this office for an evaluation. The patient denies any double vision or loss of vision, but she does have blurring of vision. This does not improve with covering one eye or the other.  Past Medical History  Diagnosis Date  . Insomnia   . H/O fatigue   . Fibromyalgia   . Dizziness and giddiness 12/25/2012    Past Surgical History  Procedure Laterality Date  . Cesarean section  1995  . Cesarean section  2000  . Shoulder surgery  05/03/2010    Left shoulder surgery, Dr.Doldorf    . Tmj arthroplasty  1990    Family History  Problem Relation Age of Onset  . Cancer Mother     Pancreatic     Social history:  reports that she has never smoked. She does not have any smokeless tobacco history on file. She reports that she drinks alcohol. She reports that she does not use illicit drugs.  Medications:  Current Outpatient Prescriptions on File Prior to Visit  Medication Sig Dispense Refill  . ALPRAZolam (XANAX) 0.25 MG tablet Take 1 tablet (0.25 mg total) by mouth 2 (two) times daily as needed for anxiety.  20 tablet  0  . AMBULATORY NON FORMULARY MEDICATION Medication Name: HRT- compound formula, RX'ed by Dr.Holland      . meclizine (ANTIVERT) 50 MG tablet Take 1/2 - 1 tablet every 8 hours as needed for dizziness  90 tablet  0  . zolpidem (  AMBIEN) 5 MG tablet One nightly if needed for sleep  30 tablet  1   No current facility-administered medications on file prior to visit.     No Known Allergies  ROS:  Out of a complete 14 system review of symptoms, the patient complains only of the following symptoms, and all other reviewed systems are negative.  Fatigue Swelling in the legs Dizziness Blurred vision Joint pain Allergies, runny nose Numbness in the hands Insomnia, decreased energy, change in appetite, racing thoughts  Blood  pressure 116/80, pulse 66, height 5' 4.5" (1.638 m), weight 128 lb (58.06 kg).  Physical Exam  General: The patient is alert and cooperative at the time of the examination.  Head: Pupils are equal, round, and reactive to light. Discs are flat bilaterally. Tympanic membranes are clear bilaterally. The patient has crepitus in the temporomandibular joint on the right.  Neck: The neck is supple, no carotid bruits are noted.  Respiratory: The respiratory examination is clear.  Cardiovascular: The cardiovascular examination reveals a regular rate and rhythm, no obvious murmurs or rubs are noted.  Skin: Extremities are without significant edema.  Neurologic Exam  Mental status: The patient is alert and oriented x 3 at the time of the examination.  Cranial nerves: Facial symmetry is present. There is good sensation of the face to pinprick and soft touch bilaterally. The strength of the facial muscles and the muscles to head turning and shoulder shrug are normal bilaterally. Speech is well enunciated, no aphasia or dysarthria is noted. Extraocular movements are full. Visual fields are full.  Motor: The motor testing reveals 5 over 5 strength of all 4 extremities. Good symmetric motor tone is noted throughout.  Sensory: Sensory testing is intact to pinprick, soft touch, vibration sensation, and position sense on all 4 extremities. No evidence of extinction is noted.  Coordination: Cerebellar testing reveals good finger-nose-finger and heel-to-shin bilaterally. The Nyan-Barrany procedure was negative.  Gait and station: Gait is normal. Tandem gait is normal. Romberg is negative. No drift is seen.  Reflexes: Deep tendon reflexes are symmetric and normal bilaterally. Toes are downgoing bilaterally.   Assessment/Plan:  One. Dizziness  2. History of benign positional vertigo  The patient has nonspecific lightheaded sensations that have been going on daily for a month. This could be a  manifestation of anxiety, muscle tension headache, or even migraine. The patient will be set up for a carotid Doppler study, and a brainstem auditory evoked response test. MRI evaluation of the brain is relatively normal. The patient will be placed on Topamax in low dose. She will followup in 3-4 months.  Marlan Palau MD 12/25/2012 8:16 PM  [facility] [address] [place], [place] [address]  Phone [phone] Fax [phone]

## 2012-12-27 ENCOUNTER — Other Ambulatory Visit: Payer: Self-pay | Admitting: Obstetrics and Gynecology

## 2012-12-27 ENCOUNTER — Encounter (HOSPITAL_COMMUNITY): Payer: Self-pay | Admitting: Pharmacist

## 2012-12-27 NOTE — H&P (Signed)
Patricia Lloyd  DICTATION # [id number] CSN# [id number]   Meriel Pica, MD 12/27/2012 9:13 AM

## 2012-12-30 ENCOUNTER — Encounter (HOSPITAL_COMMUNITY): Payer: Self-pay

## 2012-12-30 NOTE — H&P (Signed)
NAME:  Patricia Lloyd, Patricia Lloyd            ACCOUNT NO.:  [id number]  MEDICAL RECORD NO.:  [id number]  LOCATION:                                FACILITY:  [facility]  PHYSICIAN:  Myrel Rappleye M. Tavis Kring, M.D.DATE OF BIRTH:  08/15/1960  DATE OF ADMISSION:  01/01/2013 DATE OF DISCHARGE:                             HISTORY & PHYSICAL   CHIEF COMPLAINT:  Postmenopausal bleeding, endometrial polyp.  HISTORY OF PRESENT ILLNESS:  A 52-year-old postmenopausal patient G3, P2.  Her husband has had a vasectomy.  She has been on HRT doing fairly well until she began to experience some irregular bleeding on her compounded HRT program.  FHT was done in our office this past week, it demonstrated normal adnexa.  Uterus was otherwise normal on saline infusion, however, there was a well-defined polyp.  She presents now for D and C, hysteroscopy with TRUCLEAR.  This procedure including specific risks related to bleeding, infection, and other complications that may require additional surgery discussed with her, which she understands and accepts.  PAST MEDICAL HISTORY:  Allergies none.  CURRENT MEDICATIONS:  Estradiol, progesterone, CORDICARE DHEA-S testosterone supplements.  PAST SURGICAL HISTORY:  She has had 2 prior cesarean sections.  Shoulder surgery in 2013.  No other surgery.  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  FAMILY HISTORY:  Significant for arthritis, hypertension, pancreatic cancer in her mother.  SOCIAL HISTORY:  Denies drug or tobacco use.  She does drink 2 alcoholic drinks per day.  She is married.  Dr. Arthur Green is her medical doctor.  Last Pap in January 2014 was normal.  PHYSICAL EXAMINATION:  VITAL SIGNS:  Temp 98.2, blood pressure 120/78. HEENT:  Unremarkable. NECK:  Supple without masses. LUNGS:  Clear. CARDIOVASCULAR:  Regular rate and rhythm without murmurs, rubs, or gallops. BREASTS:  Without masses. ABDOMEN:  Soft, flat,  nontender.  Vulva, vagina, cervix normal.  Uterus mid position, normal size.  Adnexa negative. EXTREMITIES:  Unremarkable. NEUROLOGIC:  Unremarkable.  IMPRESSION:  Postmenopausal bleeding with endometrial polyp.  PLAN:  D and C, hysteroscopy with TRUCLEAR.  Procedure and risks discussed as above.     Lavoris Sparling M. Coutney Wildermuth, M.D.     RMH/MEDQ  D:  12/27/2012  T:  12/28/2012  Job:  [id number] 

## 2012-12-30 NOTE — H&P (Deleted)
Patricia Lloyd, Patricia Lloyd            ACCOUNT NO.:  [id number]  MEDICAL RECORD NO.:  [id number]  LOCATION:                                [facility]ILITY:  [facility]  PHYSICIAN:  Duke Salvia. Marcelle Overlie, M.D.DATE OF BIRTH:  1960-06-27  DATE OF ADMISSION:  01/01/2013 DATE OF DISCHARGE:                             HISTORY & PHYSICAL   CHIEF COMPLAINT:  Postmenopausal bleeding, endometrial polyp.  HISTORY OF PRESENT ILLNESS:  A 52 year old postmenopausal patient G3, P2.  Her husband has had a vasectomy.  She has been on HRT doing fairly well until she began to experience some irregular bleeding on her compounded HRT program.  FHT was done in our office this past week, it demonstrated normal adnexa.  Uterus was otherwise normal on saline infusion, however, there was a well-defined polyp.  She presents now for D and C, hysteroscopy with TRUCLEAR.  This procedure including specific risks related to bleeding, infection, and other complications that may require additional surgery discussed with her, which she understands and accepts.  PAST MEDICAL HISTORY:  Allergies none.  CURRENT MEDICATIONS:  Estradiol, progesterone, CORDICARE DHEA-S testosterone supplements.  PAST SURGICAL HISTORY:  She has had 2 prior cesarean sections.  Shoulder surgery in 2013.  No other surgery.  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  FAMILY HISTORY:  Significant for arthritis, hypertension, pancreatic cancer in her mother.  SOCIAL HISTORY:  Denies drug or tobacco use.  She does drink 2 alcoholic drinks per day.  She is married.  Dr. Murray Hodgkins is her medical doctor.  Last Pap in January 2014 was normal.  PHYSICAL EXAMINATION:  VITAL SIGNS:  Temp 98.2, blood pressure 120/78. HEENT:  Unremarkable. NECK:  Supple without masses. LUNGS:  Clear. CARDIOVASCULAR:  Regular rate and rhythm without murmurs, rubs, or gallops. BREASTS:  Without masses. ABDOMEN:  Soft, flat,  nontender.  Vulva, vagina, cervix normal.  Uterus mid position, normal size.  Adnexa negative. EXTREMITIES:  Unremarkable. NEUROLOGIC:  Unremarkable.  IMPRESSION:  Postmenopausal bleeding with endometrial polyp.  PLAN:  D and C, hysteroscopy with TRUCLEAR.  Procedure and risks discussed as above.     Rafe Mackowski M. Marcelle Overlie, M.D.     RMH/MEDQ  D:  12/27/2012  T:  12/28/2012  Job:  [id number]

## 2012-12-31 ENCOUNTER — Encounter (HOSPITAL_COMMUNITY): Payer: Self-pay

## 2012-12-31 MED ORDER — DEXTROSE 5 % IV SOLN
2.0000 g | INTRAVENOUS | Status: AC
  Administered 2013-01-01: 2 g via INTRAVENOUS
  Filled 2012-12-31: qty 2

## 2013-01-01 ENCOUNTER — Ambulatory Visit (HOSPITAL_COMMUNITY)
Admission: RE | Admit: 2013-01-01 | Discharge: 2013-01-01 | Disposition: A | Discharge status: Home / Self Care | Payer: BC Managed Care – PPO | Source: Ambulatory Visit | Attending: Obstetrics and Gynecology | Admitting: Obstetrics and Gynecology

## 2013-01-01 ENCOUNTER — Encounter (HOSPITAL_COMMUNITY)
Admission: RE | Disposition: A | Discharge status: Home / Self Care | Payer: Self-pay | Source: Ambulatory Visit | Attending: Obstetrics and Gynecology

## 2013-01-01 ENCOUNTER — Encounter (HOSPITAL_COMMUNITY): Payer: BC Managed Care – PPO | Admitting: Certified Registered"

## 2013-01-01 ENCOUNTER — Ambulatory Visit (HOSPITAL_COMMUNITY): Payer: BC Managed Care – PPO | Admitting: Certified Registered"

## 2013-01-01 DIAGNOSIS — N840 Polyp of corpus uteri: Secondary | ICD-10-CM | POA: Insufficient documentation

## 2013-01-01 DIAGNOSIS — N950 Postmenopausal bleeding: Secondary | ICD-10-CM | POA: Insufficient documentation

## 2013-01-01 HISTORY — DX: Other specified symptoms and signs involving the circulatory and respiratory systems: R09.89

## 2013-01-01 HISTORY — DX: Other specified postprocedural states: Z98.890

## 2013-01-01 HISTORY — DX: Nausea with vomiting, unspecified: R11.2

## 2013-01-01 HISTORY — PX: DILATATION & CURETTAGE/HYSTEROSCOPY WITH TRUECLEAR: SHX6353

## 2013-01-01 LAB — CBC
Hemoglobin: 13.5 g/dL (ref 12.0–15.0)
MCHC: 34.5 g/dL (ref 30.0–36.0)
Platelets: 208 10*3/uL (ref 150–400)
RBC: 4.38 MIL/uL (ref 3.87–5.11)
WBC: 6.3 10*3/uL (ref 4.0–10.5)

## 2013-01-01 SURGERY — DILATATION & CURETTAGE/HYSTEROSCOPY WITH TRUCLEAR
Anesthesia: General | Site: Cervix

## 2013-01-01 MED ORDER — METOCLOPRAMIDE HCL 5 MG/ML IJ SOLN: 10.0000 mg | Freq: Once | INTRAMUSCULAR | Status: DC | PRN

## 2013-01-01 MED ORDER — LIDOCAINE HCL (CARDIAC) 20 MG/ML IV SOLN
INTRAVENOUS | Status: AC
  Filled 2013-01-01: qty 5

## 2013-01-01 MED ORDER — SCOPOLAMINE 1 MG/3DAYS TD PT72
1.0000 | MEDICATED_PATCH | TRANSDERMAL | Status: DC
  Administered 2013-01-01: 1.5 mg via TRANSDERMAL

## 2013-01-01 MED ORDER — LIDOCAINE HCL (CARDIAC) 20 MG/ML IV SOLN
INTRAVENOUS | Status: DC | PRN
  Administered 2013-01-01: 50 mg via INTRAVENOUS

## 2013-01-01 MED ORDER — FENTANYL CITRATE 0.05 MG/ML IJ SOLN
INTRAMUSCULAR | Status: DC | PRN
  Administered 2013-01-01: 50 ug via INTRAVENOUS

## 2013-01-01 MED ORDER — DEXAMETHASONE SODIUM PHOSPHATE 10 MG/ML IJ SOLN
INTRAMUSCULAR | Status: AC
  Filled 2013-01-01: qty 1

## 2013-01-01 MED ORDER — ONDANSETRON HCL 4 MG/2ML IJ SOLN
INTRAMUSCULAR | Status: DC | PRN
  Administered 2013-01-01: 4 mg via INTRAVENOUS

## 2013-01-01 MED ORDER — HYDROCODONE-IBUPROFEN 7.5-200 MG PO TABS: 1.0000 | ORAL_TABLET | Freq: Three times a day (TID) | ORAL | Status: DC | PRN

## 2013-01-01 MED ORDER — PROPOFOL 10 MG/ML IV BOLUS
INTRAVENOUS | Status: DC | PRN
  Administered 2013-01-01: 150 mg via INTRAVENOUS

## 2013-01-01 MED ORDER — SCOPOLAMINE 1 MG/3DAYS TD PT72
MEDICATED_PATCH | TRANSDERMAL | Status: AC
  Administered 2013-01-01: 1.5 mg via TRANSDERMAL
  Filled 2013-01-01: qty 1

## 2013-01-01 MED ORDER — DEXAMETHASONE SODIUM PHOSPHATE 10 MG/ML IJ SOLN
INTRAMUSCULAR | Status: DC | PRN
  Administered 2013-01-01: 10 mg via INTRAVENOUS

## 2013-01-01 MED ORDER — FENTANYL CITRATE 0.05 MG/ML IJ SOLN
INTRAMUSCULAR | Status: AC
  Filled 2013-01-01: qty 2

## 2013-01-01 MED ORDER — MIDAZOLAM HCL 2 MG/2ML IJ SOLN
INTRAMUSCULAR | Status: AC
  Filled 2013-01-01: qty 2

## 2013-01-01 MED ORDER — SODIUM CHLORIDE 0.9 % IR SOLN
Status: DC | PRN
  Administered 2013-01-01: 1

## 2013-01-01 MED ORDER — PROPOFOL 10 MG/ML IV EMUL
INTRAVENOUS | Status: AC
  Filled 2013-01-01: qty 20

## 2013-01-01 MED ORDER — MIDAZOLAM HCL 5 MG/5ML IJ SOLN
INTRAMUSCULAR | Status: DC | PRN
  Administered 2013-01-01: 2 mg via INTRAVENOUS

## 2013-01-01 MED ORDER — FENTANYL CITRATE 0.05 MG/ML IJ SOLN: 25.0000 ug | INTRAMUSCULAR | Status: DC | PRN

## 2013-01-01 MED ORDER — LIDOCAINE HCL 1 % IJ SOLN
INTRAMUSCULAR | Status: DC | PRN
  Administered 2013-01-01: 10 mL

## 2013-01-01 MED ORDER — LACTATED RINGERS IV SOLN
INTRAVENOUS | Status: DC
  Administered 2013-01-01 (×2): via INTRAVENOUS

## 2013-01-01 MED ORDER — KETOROLAC TROMETHAMINE 30 MG/ML IJ SOLN
INTRAMUSCULAR | Status: DC | PRN
  Administered 2013-01-01: 30 mg via INTRAVENOUS

## 2013-01-01 MED ORDER — KETOROLAC TROMETHAMINE 30 MG/ML IJ SOLN
INTRAMUSCULAR | Status: AC
  Filled 2013-01-01: qty 1

## 2013-01-01 MED ORDER — ONDANSETRON HCL 4 MG/2ML IJ SOLN
INTRAMUSCULAR | Status: AC
  Filled 2013-01-01: qty 2

## 2013-01-01 MED ORDER — KETOROLAC TROMETHAMINE 30 MG/ML IJ SOLN: 15.0000 mg | Freq: Once | INTRAMUSCULAR | Status: DC | PRN

## 2013-01-01 MED ORDER — LIDOCAINE HCL 1 % IJ SOLN
INTRAMUSCULAR | Status: AC
  Filled 2013-01-01: qty 20

## 2013-01-01 SURGICAL SUPPLY — 20 items
BLADE INCISOR TRUC PLUS 2.9 (ABLATOR) IMPLANT
CANISTERS HI-FLOW 3000CC (CANNISTER) IMPLANT
CATH ROBINSON RED A/P 16FR (CATHETERS) ×2 IMPLANT
CLOTH BEACON ORANGE TIMEOUT ST (SAFETY) ×2 IMPLANT
CONTAINER PREFILL 10% NBF 60ML (FORM) ×4 IMPLANT
DRAPE HYSTEROSCOPY (DRAPE) ×2 IMPLANT
DRSG TELFA 3X8 NADH (GAUZE/BANDAGES/DRESSINGS) ×2 IMPLANT
GLOVE BIO SURGEON STRL SZ7 (GLOVE) ×4 IMPLANT
GOWN STRL REIN XL XLG (GOWN DISPOSABLE) ×4 IMPLANT
INCISOR TRUC PLUS BLADE 2.9 (ABLATOR) ×2
KIT HYSTEROSCOPY TRUCLEAR (ABLATOR) ×1 IMPLANT
MORCELLATOR RECIP TRUCLEAR 4.0 (ABLATOR) IMPLANT
NDL SPNL 22GX3.5 QUINCKE BK (NEEDLE) ×1 IMPLANT
NEEDLE SPNL 22GX3.5 QUINCKE BK (NEEDLE) ×2 IMPLANT
PACK VAGINAL MINOR WOMEN LF (CUSTOM PROCEDURE TRAY) ×2 IMPLANT
PAD DRESSING TELFA 3X8 NADH (GAUZE/BANDAGES/DRESSINGS) ×1 IMPLANT
PAD OB MATERNITY 4.3X12.25 (PERSONAL CARE ITEMS) ×2 IMPLANT
SYR CONTROL 10ML LL (SYRINGE) ×2 IMPLANT
TOWEL OR 17X24 6PK STRL BLUE (TOWEL DISPOSABLE) ×4 IMPLANT
WATER STERILE IRR 1000ML POUR (IV SOLUTION) ×2 IMPLANT

## 2013-01-01 NOTE — Anesthesia Postprocedure Evaluation (Signed)
  Anesthesia Post-op Note  Anesthesia Post Note  Patient: Patricia Lloyd  Procedure(s) Performed: Procedure(s) (LRB): DILATATION & CURETTAGE/HYSTEROSCOPY WITH TRUCLEAR (N/A)  Anesthesia type: General  Patient location: PACU  Post pain: Pain level controlled  Post assessment: Post-op Vital signs reviewed  Last Vitals:  Filed Vitals:   01/01/13 1033  BP:   Pulse:   Temp: 36.8 C  Resp: 16    Post vital signs: Reviewed  Level of consciousness: sedated  Complications: No apparent anesthesia complications

## 2013-01-01 NOTE — Preoperative (Signed)
Beta Blockers   Reason not to administer Beta Blockers:Not Applicable 

## 2013-01-01 NOTE — Transfer of Care (Signed)
Immediate Anesthesia Transfer of Care Note  Patient: Patricia Lloyd  Procedure(s) Performed: Procedure(s): DILATATION & CURETTAGE/HYSTEROSCOPY WITH TRUCLEAR (N/A)  Patient Location: PACU  Anesthesia Type:General  Level of Consciousness: awake, alert  and oriented  Airway & Oxygen Therapy: Patient Spontanous Breathing and Patient connected to nasal cannula oxygen  Post-op Assessment: Report given to PACU RN  Post vital signs: Reviewed  Complications: No apparent anesthesia complications

## 2013-01-01 NOTE — Anesthesia Preprocedure Evaluation (Addendum)
Anesthesia Evaluation  Patient identified by MRN, date of birth, ID band Patient awake    Reviewed: Allergy & Precautions, H&P , NPO status , Patient's Chart, lab work & pertinent test results, reviewed documented beta blocker date and time   History of Anesthesia Complications (+) PONV  Airway Mallampati: III TM Distance: >3 FB Neck ROM: full  Mouth opening: Limited Mouth Opening Comment: +TMJ Dental  (+) Teeth Intact   Pulmonary neg pulmonary ROS,  breath sounds clear to auscultation  Pulmonary exam normal       Cardiovascular Exercise Tolerance: Good negative cardio ROS  Rhythm:regular Rate:Normal     Neuro/Psych  Headaches (dizziness and headaches - on topamax), negative psych ROS   GI/Hepatic negative GI ROS, Neg liver ROS,   Endo/Other  negative endocrine ROS  Renal/GU negative Renal ROS  Female GU complaint     Musculoskeletal  (+) Fibromyalgia -  Abdominal   Peds  Hematology negative hematology ROS (+)   Anesthesia Other Findings   Reproductive/Obstetrics negative OB ROS                           Anesthesia Physical Anesthesia Plan  ASA: II  Anesthesia Plan: General LMA   Post-op Pain Management:    Induction:   Airway Management Planned:   Additional Equipment:   Intra-op Plan:   Post-operative Plan:   Informed Consent: I have reviewed the patients History and Physical, chart, labs and discussed the procedure including the risks, benefits and alternatives for the proposed anesthesia with the patient or authorized representative who has indicated his/her understanding and acceptance.   Dental Advisory Given  Plan Discussed with: CRNA and Surgeon  Anesthesia Plan Comments: (Pt would prefer to have MAC.  Does not want spinal.  Will discuss with Dr Marcelle Overlie.)       Anesthesia Quick Evaluation

## 2013-01-01 NOTE — Progress Notes (Signed)
The patient was re-examined with no change in status 

## 2013-01-01 NOTE — Op Note (Signed)
Preoperative diagnosis: Perimenopausal bleeding, endometrial polyp  Postoperative diagnosis: Same  Procedure: D&C hysteroscopy with resection of endometrial polyp with true clear  Surgeon: Marcelle Overlie  Anesthesia: Gen.  Specimens removed: Endometrial polyp, to pathology  EBL: Less than 10 cc  Procedure and findings:  The patient taken the operating room after an adequate level of general anesthesia was obtained pain with the patient's legs in stirrups the perineum and vagina prepped and draped in usual fashion, bladder was drained, EUA carried out uterus mid position normal size adnexa negative. Appropriate timeout taken at that point.  Cervix grasped with tenaculum paracervical block was then created by infiltrating at 3 and 9:00 submucosally 5-7 cc 1% Xylocaine at each site after negative aspiration. Uterus sounded to 8 cm progressively dilated to a 27 Pratt dilator. Continuous flow hysteroscope was inserted a large left posterior/fundal polyp was noted. The smaller morcellator was introduced and this was morcellated easily. Once this was completed the cavity looked completely normal. This was hemostatic she tolerated this well went to recovery room in good condition.  Dictated with [facility]  Cutberto Winfree M. Milana Obey.D.

## 2013-01-03 ENCOUNTER — Encounter (HOSPITAL_COMMUNITY): Payer: Self-pay | Admitting: Obstetrics and Gynecology

## 2013-01-15 ENCOUNTER — Other Ambulatory Visit: Payer: BC Managed Care – PPO

## 2013-01-27 ENCOUNTER — Encounter: Payer: Self-pay | Admitting: Vascular Surgery

## 2013-01-28 ENCOUNTER — Encounter (INDEPENDENT_AMBULATORY_CARE_PROVIDER_SITE_OTHER): Payer: Self-pay

## 2013-01-28 ENCOUNTER — Ambulatory Visit (HOSPITAL_COMMUNITY)
Admission: RE | Admit: 2013-01-28 | Discharge: 2013-01-28 | Disposition: A | Discharge status: Home / Self Care | Payer: BC Managed Care – PPO | Source: Ambulatory Visit | Attending: Vascular Surgery | Admitting: Vascular Surgery

## 2013-01-28 ENCOUNTER — Ambulatory Visit (INDEPENDENT_AMBULATORY_CARE_PROVIDER_SITE_OTHER): Payer: BC Managed Care – PPO | Admitting: Vascular Surgery

## 2013-01-28 ENCOUNTER — Encounter: Payer: Self-pay | Admitting: Vascular Surgery

## 2013-01-28 VITALS — BP 130/80 | HR 65 | Resp 16 | Ht 64.5 in | Wt 128.0 lb

## 2013-01-28 DIAGNOSIS — M79609 Pain in unspecified limb: Secondary | ICD-10-CM | POA: Insufficient documentation

## 2013-01-28 DIAGNOSIS — R609 Edema, unspecified: Secondary | ICD-10-CM | POA: Insufficient documentation

## 2013-01-28 DIAGNOSIS — R600 Localized edema: Secondary | ICD-10-CM | POA: Insufficient documentation

## 2013-01-28 DIAGNOSIS — I83893 Varicose veins of bilateral lower extremities with other complications: Secondary | ICD-10-CM

## 2013-01-28 NOTE — Progress Notes (Signed)
Subjective:     Patient ID: Patricia Lloyd, female   DOB: April 28, 1960, 53 y.o.   MRN: [id number]  HPI this 53 year old female is evaluated for pain and swelling right leg worse than left. She states over the past few months she has noted increased aching throbbing and burning discomfort in the right leg below the knee. She has no history of DVT, thrombophlebitis, stasis ulcers, or bleeding. 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 She does not elevate her legs. He has been evaluated twice in vein centers over the past few years with one recommendation of laser ablation and the other elastic compression  Past Medical History  Diagnosis Date  . Insomnia   . H/O fatigue   . Fibromyalgia   . Dizziness and giddiness 12/25/2012  . PONV (postoperative nausea and vomiting)     severe  . Poor circulation     wears support stockings    History  Substance Use Topics  . Smoking status: Never Smoker   . Smokeless tobacco: Not on file  . Alcohol Use: Yes     Comment: 2 GLASSES WINE DAILY     Family History  Problem Relation Age of Onset  . Cancer Mother     Pancreatic     No Known Allergies  Current outpatient prescriptions:ALPRAZolam (XANAX) 0.25 MG tablet, Take 1 tablet (0.25 mg total) by mouth 2 (two) times daily as needed for anxiety., Disp: 20 tablet, Rfl: 0;  AMBULATORY NON FORMULARY MEDICATION, Medication Name: HRT- compound formula, RX'ed by Dr.Holland, Disp: , Rfl: ;  fluticasone (FLONASE) 50 MCG/ACT nasal spray, Place 50 sprays into both nostrils as needed. , Disp: , Rfl:  ibuprofen (ADVIL,MOTRIN) 200 MG tablet, Take 400 mg by mouth every 6 (six) hours as needed for headache., Disp: , Rfl: ;  HYDROcodone-ibuprofen (VICOPROFEN) 7.5-200 MG per tablet, Take 1 tablet by mouth every 8 (eight) hours as needed for moderate pain., Disp: 30 tablet, Rfl: 0;  Polyethyl Glycol-Propyl Glycol (SYSTANE OP), Apply 1 drop to eye 2 (two) times daily  as needed (For dry  eyes)., Disp: , Rfl:  topiramate (TOPAMAX) 25 MG tablet, One tablet at night for one week, then take two tablets at night, Disp: 60 tablet, Rfl: 3  BP 130/80  Pulse 65  Resp 16  Ht 5' 4.5" (1.638 m)  Wt 128 lb (58.06 kg)  BMI 21.64 kg/m2  Body mass index is 21.64 kg/(m^2).           Review of Systems denies chest pain, dyspnea on exertion, PND, orthopnea, hemoptysis, claudication. All systems negative and complete review of systems     Objective:   Physical Exam BP 130/80  Pulse 65  Resp 16  Ht 5' 4.5" (1.638 m)  Wt 128 lb (58.06 kg)  BMI 21.64 kg/m2  Gen.-alert and oriented x3 in no apparent distress HEENT normal for age Lungs no rhonchi or wheezing Cardiovascular regular rhythm no murmurs carotid pulses 3+ palpable no bruits audible Abdomen soft nontender no palpable masses Musculoskeletal free of  major deformities Skin clear -no rashes Neurologic normal Lower extremities 3+ femoral and dorsalis pedis pulses palpable bilaterally with no edema  Today I ordered a bilateral venous duplex exam which I reviewed and interpreted. Right leg has some deep venous reflux. There is some spotty areas of superficial reflux in the right great saphenous system which is not consistent and the caliber of the vein is small. Left leg is free  of reflux in the deep and superficial systems.       Assessment:     Pain and swelling right lower extremity-likely due to mild reflux and deep vein system on the right-no varicosities    Plan:     #1 short-leg elastic compression stockings 20-30 mm gradient #2 elevate legs 2 inches during night #3 return to see me on when necessary basis there is no indication for any laser ablation or other venous procedures on this lady

## 2013-01-29 ENCOUNTER — Ambulatory Visit (INDEPENDENT_AMBULATORY_CARE_PROVIDER_SITE_OTHER): Payer: BC Managed Care – PPO

## 2013-01-29 DIAGNOSIS — R42 Dizziness and giddiness: Secondary | ICD-10-CM

## 2013-01-30 ENCOUNTER — Telehealth: Payer: Self-pay | Admitting: Neurology

## 2013-01-30 NOTE — Telephone Encounter (Signed)
I called patient. The patient began having headaches on Topamax. The Topamax did help the dizziness sensations. The patient off medication, she does not want to try other medication such as Zonegran or Diamox. The carotid Doppler study has been done, results are pending. The patient will call me if she does want to try another medication.

## 2013-02-05 ENCOUNTER — Telehealth: Payer: Self-pay | Admitting: Neurology

## 2013-02-05 NOTE — Telephone Encounter (Signed)
I called patient. The carotid Doppler study was unremarkable. The patient will call me if she desires to go on another medication for the dizziness.

## 2013-06-20 ENCOUNTER — Telehealth: Payer: Self-pay | Admitting: *Deleted

## 2013-06-20 NOTE — Telephone Encounter (Signed)
Dr. Anne HahnWillis would like to know if patient would be willing to see Aundra MilletMegan instead of him. Left message for patient to call office back if she would be willing to switch.

## 2013-06-23 ENCOUNTER — Ambulatory Visit: Payer: BC Managed Care – PPO | Admitting: Neurology

## 2013-07-09 LAB — HM PAP SMEAR: HM PAP: NORMAL

## 2013-08-14 ENCOUNTER — Encounter: Payer: Self-pay | Admitting: Podiatry

## 2013-08-14 ENCOUNTER — Ambulatory Visit (INDEPENDENT_AMBULATORY_CARE_PROVIDER_SITE_OTHER): Payer: BC Managed Care – PPO | Admitting: Podiatry

## 2013-08-14 ENCOUNTER — Ambulatory Visit (INDEPENDENT_AMBULATORY_CARE_PROVIDER_SITE_OTHER): Payer: BC Managed Care – PPO

## 2013-08-14 VITALS — BP 136/89 | HR 58 | Resp 15 | Ht 64.5 in | Wt 128.0 lb

## 2013-08-14 DIAGNOSIS — M21619 Bunion of unspecified foot: Secondary | ICD-10-CM

## 2013-08-14 DIAGNOSIS — M21611 Bunion of right foot: Secondary | ICD-10-CM

## 2013-08-14 DIAGNOSIS — M779 Enthesopathy, unspecified: Secondary | ICD-10-CM

## 2013-08-14 DIAGNOSIS — M2010 Hallux valgus (acquired), unspecified foot: Secondary | ICD-10-CM

## 2013-08-14 MED ORDER — TRIAMCINOLONE ACETONIDE 10 MG/ML IJ SUSP
10.0000 mg | Freq: Once | INTRAMUSCULAR | Status: AC
  Administered 2013-08-14: 10 mg

## 2013-08-14 NOTE — Progress Notes (Signed)
Subjective:     Patient ID: Patricia Lloyd, female   DOB: 05/16/1960, 53 y.o.   MRN: [id number]  Foot Pain   patient presents stating I'm having a lot of pain around my big toe joint of my right foot for 4 months. Hard to wear certain types shoes and it's inflamed and tender when pressed   Review of Systems  All other systems reviewed and are negative.      Objective:   Physical Exam  Nursing note and vitals reviewed. Constitutional: She is oriented to person, place, and time.  Cardiovascular: Intact distal pulses.   Musculoskeletal: Normal range of motion.  Neurological: She is oriented to person, place, and time.  Skin: Skin is warm.   neurovascular status intact with muscle strength adequate and range of motion subtalar midtarsal joint within normal limits. 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 I carefully injected around the first MPJ 3 mg Kenalog 5 mg Xylocaine Marcaine mixture right and advised that when symptomatic I want to see her back and that ultimately this may require surgical intervention with Austin-type osteotomy procedure

## 2013-08-14 NOTE — Progress Notes (Signed)
   Subjective:    Patient ID: Patricia Lloyd, female    DOB: 06/24/1960, 53 y.o.   MRN: [id number]  HPI Comments: Pt states she has had pain and decreased ROM for 4 months, and was evaluated by orthopedic doctor and was informed the area had arthritis and she needed surgery.  Pt states she is not ready for surgery and feels there must be other alternative treatments.  Foot Pain      Review of Systems  Psychiatric/Behavioral:       Increase stress currently.  All other systems reviewed and are negative.      Objective:   Physical Exam        Assessment & Plan:

## 2013-10-09 LAB — HM MAMMOGRAPHY: HM Mammogram: NORMAL

## 2014-01-15 ENCOUNTER — Other Ambulatory Visit: Payer: Self-pay | Admitting: Gastroenterology

## 2014-01-15 DIAGNOSIS — R109 Unspecified abdominal pain: Secondary | ICD-10-CM

## 2014-01-23 ENCOUNTER — Other Ambulatory Visit: Payer: Self-pay | Admitting: Gastroenterology

## 2014-01-23 ENCOUNTER — Ambulatory Visit
Admission: RE | Admit: 2014-01-23 | Discharge: 2014-01-23 | Disposition: A | Discharge status: Home / Self Care | Payer: Self-pay | Source: Ambulatory Visit | Attending: Gastroenterology | Admitting: Gastroenterology

## 2014-01-23 DIAGNOSIS — R109 Unspecified abdominal pain: Secondary | ICD-10-CM

## 2014-04-29 ENCOUNTER — Ambulatory Visit (HOSPITAL_COMMUNITY)
Admission: RE | Admit: 2014-04-29 | Discharge: 2014-04-29 | Disposition: A | Discharge status: Home / Self Care | Payer: BLUE CROSS/BLUE SHIELD | Source: Ambulatory Visit | Attending: Vascular Surgery | Admitting: Vascular Surgery

## 2014-04-29 ENCOUNTER — Telehealth: Payer: Self-pay | Admitting: *Deleted

## 2014-04-29 ENCOUNTER — Ambulatory Visit (INDEPENDENT_AMBULATORY_CARE_PROVIDER_SITE_OTHER): Payer: BLUE CROSS/BLUE SHIELD | Admitting: Vascular Surgery

## 2014-04-29 ENCOUNTER — Encounter: Payer: Self-pay | Admitting: Vascular Surgery

## 2014-04-29 ENCOUNTER — Other Ambulatory Visit: Payer: Self-pay | Admitting: *Deleted

## 2014-04-29 VITALS — BP 125/85 | HR 59 | Ht 64.5 in | Wt 136.3 lb

## 2014-04-29 DIAGNOSIS — M79604 Pain in right leg: Secondary | ICD-10-CM | POA: Insufficient documentation

## 2014-04-29 DIAGNOSIS — M7989 Other specified soft tissue disorders: Secondary | ICD-10-CM

## 2014-04-29 DIAGNOSIS — I83891 Varicose veins of right lower extremities with other complications: Secondary | ICD-10-CM | POA: Diagnosis not present

## 2014-04-29 NOTE — Telephone Encounter (Signed)
Pt is having significant swelling from the knee down on her right leg which involves foot, ankle and calf. Also having worsening pain. Has been going on several days. [facility] Has deep relux and some spots of reflux in the GSV but vein diameter was small. She has worn thigh highs over the past 2 1/2 years and wearing them more consistantly over the past 3-4 months. Has also been elevating and taking Ibuprofen. She describes and area near her ankle that sounds like thrombophlebitis off and on. We will perform a study today to r/o DVT and she will see Dr. Edilia Boickson after.

## 2014-04-29 NOTE — Progress Notes (Signed)
Vascular and Vein Specialist of [facility]  Patient name: Patricia Lloyd MRN: [id number] DOB: May 31, 1960 Sex: female  REASON FOR VISIT: right lower extremity swelling.  HPI: Patricia Lloyd is a 54 y.o. female who was seen by Dr. Hart Rochester on 01/28/2013 with pain and swelling of the right lower extremity more so than the left lower extremity. The patient had a venous duplex scan at that time which showed that the right leg had some deep vein reflux. There were some spotty areas of superficial reflux in the right greater saphenous vein and the vein was small. The left leg did not have any reflux in the deep or superficial system. He recommended knee-high compression stockings with a gradient of 20-30 mmHg and leg elevation. She was to come back as needed.   Since she was seen last, she states that her symptoms have gotten worse. She describes significant aching, bursting pressure in her right leg which gets worse during the day and is associated with right leg swelling. The symptoms are aggravated by activity. Her symptoms did not sound like claudication as this was a more bursting pressure sensation. She does have some back issues but does not have significant back pain currently. She has had no previous abdominal surgery except for 2 previous C-sections and has had no inguinal surgery or radiation therapy to suggest secondary lymphedema.  She denies any previous history of DVT or phlebitis.  Past Medical History  Diagnosis Date  . Insomnia   . H/O fatigue   . Fibromyalgia   . Dizziness and giddiness 12/25/2012  . PONV (postoperative nausea and vomiting)     severe  . Poor circulation     wears support stockings   Family History  Problem Relation Age of Onset  . Cancer Mother     Pancreatic    SOCIAL HISTORY: History  Substance Use Topics  . Smoking status: Never Smoker   . Smokeless tobacco: Not on file  . Alcohol Use: 0.0 oz/week    0 Standard drinks or equivalent per week       Comment: 2 GLASSES WINE DAILY    No Known Allergies Current Outpatient Prescriptions  Medication Sig Dispense Refill  . ALPRAZolam (XANAX) 0.25 MG tablet Take 1 tablet (0.25 mg total) by mouth 2 (two) times daily as needed for anxiety. 20 tablet 0  . B Complex Vitamins (B COMPLEX PO) Take by mouth.    Marland Kitchen BIOTIN FORTE PO Take by mouth.    . ESTERIFIED ESTROGENS PO Take by mouth.    . fluticasone (FLONASE) 50 MCG/ACT nasal spray Place 50 sprays into both nostrils as needed.     Marland Kitchen ibuprofen (ADVIL,MOTRIN) 200 MG tablet Take 400 mg by mouth every 6 (six) hours as needed for headache.    . magnesium gluconate (MAGONATE) 500 MG tablet Take 500 mg by mouth 2 (two) times daily.    . Multiple Vitamins-Minerals (MULTI COMPLETE PO) Take by mouth.    Marland Kitchen OVER THE COUNTER MEDICATION Cellulegs    . Polyethyl Glycol-Propyl Glycol (SYSTANE OP) Apply 1 drop to eye 2 (two) times daily as needed (For dry  eyes).    . Progesterone Micronized (PROGESTERONE PO) Take by mouth.     No current facility-administered medications for this visit.   REVIEW OF SYSTEMS: Patricia.Lloyd ] denotes positive finding; [  ] denotes negative finding  CARDIOVASCULAR:   chest pain    chest pressure    palpitations    orthopnea     dyspnea on exertion   [ ]  claudication   [ ]  rest pain   [ ]  DVT   [ ]  phlebitis PULMONARY:   [ ]  productive cough   [ ]  asthma   [ ]  wheezing NEUROLOGIC:   [ ]  weakness  Patricia.Lloyd[X ] paresthesias  [ ]  aphasia  [ ]  amaurosis  [ ]  dizziness HEMATOLOGIC:   [ ]  bleeding problems   [ ]  clotting disorders MUSCULOSKELETAL:  [ ]  joint pain   [ ]  joint swelling Patricia.Lloyd[X ] leg swelling GASTROINTESTINAL: [ ]   blood in stool  [ ]   hematemesis GENITOURINARY:  [ ]   dysuria  [ ]   hematuria PSYCHIATRIC:  [ ]  history of major depression INTEGUMENTARY:  [ ]  rashes  [ ]  ulcers CONSTITUTIONAL:  [ ]  fever   [ ]  chills  PHYSICAL EXAM: Filed Vitals:   04/29/14 1229  BP: 125/85  Pulse: 59  Height: 5' 4.5" (1.638 m)  Weight:  136 lb 4.8 oz (61.825 kg)  SpO2: 99%   GENERAL: The patient is a well-nourished female, in no acute distress. The vital signs are documented above. CARDIOVASCULAR: There is a regular rate and rhythm. I do not detect carotid bruits. She has palpable dorsalis pedis and posterior tibial pulses bilaterally. She has mild right lower extremity swelling. PULMONARY: There is good air exchange bilaterally without wheezing or rales. ABDOMEN: Soft and non-tender with normal pitched bowel sounds.  MUSCULOSKELETAL: There are no major deformities or cyanosis. NEUROLOGIC: No focal weakness or paresthesias are detected. SKIN: There are no ulcers or rashes noted. She has no significant truncal varicosities or telangiectasias. PSYCHIATRIC: The patient has a normal affect.  DATA:  I have independently interpreted her venous duplex scan today which shows no reflux in the deep or superficial veins and no evidence of DVT or superficial thrombophlebitis. The saphenous vein diameters range from 0.24 cm-0.46 cm at the saphenofemoral junction.  MEDICAL ISSUES:  CHRONIC VENOUS INSUFFICIENCY: this is a puzzling situation in that certainly her symptoms sound like symptoms related to venous hypertension. She describes aching, bursting pressure in her legs associated with standing and aggravated by activity suggesting venous claudication. However, her duplex does not show significant deep or superficial reflux. He has no evidence of arterial insufficiency and has normal pulses and no claudication. Likewise she has no evidence on exam of significant lymphedema.  For this reason, I have recommended a CT of the abdomen and pelvis with venous phase to look for evidence of venous outflow obstruction or narrowing within the iliac vein. If she had narrowing in the vein sheet considered for venoplasty. If she had an obstructing mass in August and this would have to be worked up. In the meantime, we have discussed the treatment of chronic  venous insufficiency. I've emphasized the importance of elevating her legs daily in the proper position. I've also written her prescription for compression stockings as her stockings are old. I'm encouraged her to avoid prolonged sitting and standing and to exercise as tolerated. I also encouraged her to consider water aerobics which is also helpful for patients with venous disease. She is agreeable to proceed with CT of the abdomen and pelvis with venous phase and we will make further recommendations pending these results.   Eldean Klatt S [facility] [place] Beeper: [phone]

## 2014-05-01 ENCOUNTER — Other Ambulatory Visit: Payer: BLUE CROSS/BLUE SHIELD

## 2014-05-20 ENCOUNTER — Inpatient Hospital Stay: Admission: RE | Admit: 2014-05-20 | Payer: BLUE CROSS/BLUE SHIELD | Source: Ambulatory Visit

## 2014-05-25 ENCOUNTER — Other Ambulatory Visit: Payer: Self-pay

## 2014-05-25 DIAGNOSIS — Z0000 Encounter for general adult medical examination without abnormal findings: Secondary | ICD-10-CM

## 2014-05-29 ENCOUNTER — Other Ambulatory Visit: Payer: BLUE CROSS/BLUE SHIELD

## 2014-05-29 DIAGNOSIS — Z0000 Encounter for general adult medical examination without abnormal findings: Secondary | ICD-10-CM

## 2014-05-30 LAB — LIPID PANEL
CHOLESTEROL TOTAL: 182 mg/dL (ref 100–199)
Chol/HDL Ratio: 2.3 ratio units (ref 0.0–4.4)
HDL: 79 mg/dL (ref >39)
LDL CALC: 93 mg/dL (ref 0–99)
Triglycerides: 51 mg/dL (ref 0–149)
VLDL Cholesterol Cal: 10 mg/dL (ref 5–40)

## 2014-05-30 LAB — COMPREHENSIVE METABOLIC PANEL
ALBUMIN: 4 g/dL (ref 3.5–5.5)
ALK PHOS: 39 IU/L (ref 39–117)
ALT: 16 IU/L (ref 0–32)
AST: 22 IU/L (ref 0–40)
Albumin/Globulin Ratio: 1.4 (ref 1.1–2.5)
BUN/Creatinine Ratio: 17 (ref 9–23)
BUN: 13 mg/dL (ref 6–24)
Bilirubin Total: 0.4 mg/dL (ref 0.0–1.2)
CO2: 22 mmol/L (ref 18–29)
Calcium: 8.9 mg/dL (ref 8.7–10.2)
Chloride: 102 mmol/L (ref 97–108)
Creatinine, Ser: 0.78 mg/dL (ref 0.57–1.00)
GFR calc non Af Amer: 85 mL/min/{1.73_m2} (ref >59)
GFR, EST AFRICAN AMERICAN: 98 mL/min/{1.73_m2} (ref >59)
GLUCOSE: 83 mg/dL (ref 65–99)
Globulin, Total: 2.8 g/dL (ref 1.5–4.5)
POTASSIUM: 4.3 mmol/L (ref 3.5–5.2)
SODIUM: 138 mmol/L (ref 134–144)
TOTAL PROTEIN: 6.8 g/dL (ref 6.0–8.5)

## 2014-05-30 LAB — CBC WITH DIFFERENTIAL/PLATELET
BASOS: 1 %
Basophils Absolute: 0 10*3/uL (ref 0.0–0.2)
EOS (ABSOLUTE): 0.1 10*3/uL (ref 0.0–0.4)
EOS: 2 %
HEMOGLOBIN: 12.2 g/dL (ref 11.1–15.9)
Hematocrit: 37.3 % (ref 34.0–46.6)
Immature Grans (Abs): 0 10*3/uL (ref 0.0–0.1)
Immature Granulocytes: 0 %
Lymphocytes Absolute: 2 10*3/uL (ref 0.7–3.1)
Lymphs: 28 %
MCH: 29.6 pg (ref 26.6–33.0)
MCHC: 32.7 g/dL (ref 31.5–35.7)
MCV: 91 fL (ref 79–97)
Monocytes Absolute: 0.5 10*3/uL (ref 0.1–0.9)
Monocytes: 7 %
Neutrophils Absolute: 4.4 10*3/uL (ref 1.4–7.0)
Neutrophils: 62 %
Platelets: 222 10*3/uL (ref 150–379)
RBC: 4.12 x10E6/uL (ref 3.77–5.28)
RDW: 13.4 % (ref 12.3–15.4)
WBC: 7.1 10*3/uL (ref 3.4–10.8)

## 2014-05-30 LAB — TSH: TSH: 2.28 u[IU]/mL (ref 0.450–4.500)

## 2014-06-03 ENCOUNTER — Encounter: Payer: BLUE CROSS/BLUE SHIELD | Admitting: Internal Medicine

## 2014-06-03 ENCOUNTER — Ambulatory Visit: Payer: BLUE CROSS/BLUE SHIELD | Admitting: Sports Medicine

## 2014-06-10 ENCOUNTER — Encounter: Payer: Self-pay | Admitting: Internal Medicine

## 2014-06-10 ENCOUNTER — Ambulatory Visit (INDEPENDENT_AMBULATORY_CARE_PROVIDER_SITE_OTHER): Payer: BLUE CROSS/BLUE SHIELD | Admitting: Internal Medicine

## 2014-06-10 VITALS — BP 122/64 | HR 66 | Temp 98.2°F | Resp 12 | Ht 65.0 in | Wt 137.6 lb

## 2014-06-10 DIAGNOSIS — F411 Generalized anxiety disorder: Secondary | ICD-10-CM | POA: Diagnosis not present

## 2014-06-10 DIAGNOSIS — Z23 Encounter for immunization: Secondary | ICD-10-CM | POA: Diagnosis not present

## 2014-06-10 DIAGNOSIS — M797 Fibromyalgia: Secondary | ICD-10-CM

## 2014-06-10 MED ORDER — ALPRAZOLAM 0.25 MG PO TABS: 0.2500 mg | ORAL_TABLET | Freq: Two times a day (BID) | ORAL | Status: DC | PRN

## 2014-06-10 MED ORDER — DULOXETINE HCL 30 MG PO CPEP: ORAL_CAPSULE | ORAL | Status: DC
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
## 2014-06-24 ENCOUNTER — Ambulatory Visit (INDEPENDENT_AMBULATORY_CARE_PROVIDER_SITE_OTHER): Payer: BLUE CROSS/BLUE SHIELD | Admitting: Sports Medicine

## 2014-06-24 ENCOUNTER — Encounter: Payer: Self-pay | Admitting: Sports Medicine

## 2014-06-24 VITALS — BP 79/60 | HR 82 | Ht 64.0 in | Wt 132.0 lb

## 2014-06-24 DIAGNOSIS — M7582 Other shoulder lesions, left shoulder: Secondary | ICD-10-CM | POA: Insufficient documentation

## 2014-06-24 DIAGNOSIS — S86811A Strain of other muscle(s) and tendon(s) at lower leg level, right leg, initial encounter: Secondary | ICD-10-CM | POA: Insufficient documentation

## 2014-06-24 MED ORDER — NITROGLYCERIN 0.2 MG/HR TD PT24: MEDICATED_PATCH | TRANSDERMAL | Status: DC

## 2014-06-24 NOTE — Assessment & Plan Note (Signed)
-  You have a thinning and atrophy of your supraspinatus rotator cuff tendon. No frank tear is seen. -Start Nitroglycerin protocol for the left shoulder and right anterior tibialis (shin) strain. -Add the following rehab exercises to try to strengthen your left supraspinatus tendon:  1. Discontinue band exercises  2. Start low weight (3-5 lb) exercises including...   A. Upright rows with weights, 8-10 times x 3 sets once daily   B. Spokes on a wheel exercises, not raising past 90 degrees, 10-15 times x 3 sets once daily   C. Modified wall push-ups, 8-10 times x 3 sets once daily. -Plan on follow-up in 6 weeks or sooner if needed.
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
## 2014-06-24 NOTE — Patient Instructions (Signed)
-  You have a thinning and atrophy of your supraspinatus rotator cuff tendon. No frank tear is seen. -Start Nitroglycerin protocol for the left shoulder and right anterior tibialis (shin) strain. -Wear the right calf sleeve when active for prolonged times, but otherwise don't need to wear it necessarily. -Add the following rehab exercises to try to strengthen your left supraspinatus tendon:  1. Discontinue band exercises  2. Start low weight (3-5 lb) exercises including...   A. Upright rows with weights, 8-10 times x 3 sets once daily   B. Spokes on a wheel exercises, not raising past 90 degrees, 10-15 times x 3 sets once daily   C. Modified wall push-ups, 8-10 times x 3 sets once daily. -Plan on follow-up in 6 weeks or sooner if needed.

## 2014-07-17 ENCOUNTER — Telehealth: Payer: Self-pay | Admitting: *Deleted

## 2014-07-17 NOTE — Telephone Encounter (Signed)
Since pt stated she had only worn the calf sleeve once and it caused tightness and she never wore it again, i told her she could bring it back and exchange for a bigger size. She could not come by today due to going out of town but I suggested she try an ACE wrap over the weekend and if that did not work she could swing by Monday for the Bodyhelix size upgrade.

## 2014-07-29 ENCOUNTER — Ambulatory Visit: Payer: BLUE CROSS/BLUE SHIELD | Admitting: Sports Medicine

## 2014-07-30 ENCOUNTER — Ambulatory Visit (INDEPENDENT_AMBULATORY_CARE_PROVIDER_SITE_OTHER): Payer: BLUE CROSS/BLUE SHIELD | Admitting: Sports Medicine

## 2014-07-30 ENCOUNTER — Encounter: Payer: Self-pay | Admitting: Sports Medicine

## 2014-07-30 VITALS — BP 134/85 | Ht 64.0 in | Wt 132.0 lb

## 2014-07-30 DIAGNOSIS — S86811D Strain of other muscle(s) and tendon(s) at lower leg level, right leg, subsequent encounter: Secondary | ICD-10-CM

## 2014-07-30 DIAGNOSIS — M7582 Other shoulder lesions, left shoulder: Secondary | ICD-10-CM

## 2014-07-30 MED ORDER — AMITRIPTYLINE HCL 25 MG PO TABS: 25.0000 mg | ORAL_TABLET | Freq: Every day | ORAL | Status: DC

## 2014-07-30 NOTE — Assessment & Plan Note (Addendum)
-   continue with shoulder exercises (upright rows, spokes on a wheel, modified wall push ups), as shoulder ROM and strength seem to be improving. - 25 mg amitryptiline at nighttime to improve pain in shoulder and improve sleep quality She had difficulty with barbotage at ortho office/ still has calcific change  May need to try again if still painful - follow up in 6 weeks

## 2014-07-30 NOTE — Assessment & Plan Note (Addendum)
Tear in anterior tibialis muscle appears much improved from original U/S. - Continue to wear body helix compression sleeve with activity. - Continue wearing NTG protocol. - Follow up in 6 weeks.

## 2014-07-30 NOTE — Progress Notes (Signed)
Subjective:     Patient ID: Patricia Lloyd, female   DOB: [birth year], 54 y.o.   MRN: [id number]  HPI Patient is a 54 yo right hand dominant female presenting for follow up of left shoulder pain and right anterior shin pain.  She continues to have left shoulder pain that has shown no improvement since last visit. She has been doing the shoulder exercises every other day. She is unable to tolerate the nitroglycerin patches on her shoulder due to headaches, nausea, dizziness. She describes a constant throb in her shoulder. It is very sensitive to touch and she wakes up every time she rolls over on her left shoulder. She localizes the pain to her posterior shoulder that radiates down to her lateral arm. This RC was repaired in 2012.  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  Unable to sleep well for past couple of years // shoulders will waken her  Patient's right anterior tibialis has improved. She is able to use the nitroglycerin patches on her shin without getting headaches (unlike her shoulder) and has been wearing them every other day. The body helix she got at her last appointment her was too tight, but she got a larger size last week and has been wearing it with activity since then. She still notices swelling at times, but the pain has improved significantly.    Review of Systems Per HPI.    Objective:   Physical Exam  Gen: NAD BP 134/85 mmHg  Ht  (1.626 m)  Wt 132 lb (59.875 kg)  BMI 22.65 kg/m2 MSK:  Left shoulder: No erythema, swelling noted. Atrophy noted over posterior left shoulder compared to right. Pain to palpation over acromioclavicular joint and over posterior shoulder. Full active ROM; pain with flexion and internal rotation. Positive impingement with Hawkins. Positive empty can test.   Right lower extremity:  No swelling, erythema noted over anterior leg. Mild pain to palpation over anterior tibialis muscle belly. No calf tenderness or tenderness over the  anterior tibia. Good strength with dorsiflexion, plantarflexion, eversion, inversion.   Ultrasound: Left shoulder: Calcium deposits in infraspinatus and supraspinatus.  Both MM are smaller than expected. Right Anterior tibialis muscle has no hypoechoic changes; appears much improved from prior U/S.       Assessment:     See problem focused A/P    Plan:     See problem focused A/P

## 2014-08-20 ENCOUNTER — Telehealth: Payer: Self-pay | Admitting: *Deleted

## 2014-08-20 NOTE — Telephone Encounter (Signed)
Pt called saying her hand and fingers have been numb and tingling more often now.  Said she would wait til her appt on the 24th, but wanted to update [place] that its giving her more problems

## 2014-08-31 LAB — HM MAMMOGRAPHY

## 2014-09-02 ENCOUNTER — Encounter: Payer: Self-pay | Admitting: Sports Medicine

## 2014-09-02 ENCOUNTER — Ambulatory Visit (INDEPENDENT_AMBULATORY_CARE_PROVIDER_SITE_OTHER): Payer: BLUE CROSS/BLUE SHIELD | Admitting: Sports Medicine

## 2014-09-02 VITALS — BP 125/84 | HR 67 | Ht 64.0 in | Wt 132.0 lb

## 2014-09-02 DIAGNOSIS — M79602 Pain in left arm: Secondary | ICD-10-CM

## 2014-09-02 DIAGNOSIS — M5412 Radiculopathy, cervical region: Secondary | ICD-10-CM | POA: Diagnosis not present

## 2014-09-02 MED ORDER — TRAMADOL HCL 50 MG PO TABS: 50.0000 mg | ORAL_TABLET | Freq: Four times a day (QID) | ORAL | Status: DC | PRN

## 2014-09-02 MED ORDER — GABAPENTIN 300 MG PO CAPS: 300.0000 mg | ORAL_CAPSULE | Freq: Every day | ORAL | Status: DC

## 2014-09-02 NOTE — Assessment & Plan Note (Signed)
Consistent with cervical radiculopathy. No motor function loss. Will treat with gabapentin  at night and titrate up to  tid. Will also start tramadol for pain. Discussed neck exercises to help alleviate some nerve irritation. Will follow up in 4 weeks. If pain improved, will consider starting physical therapy.

## 2014-09-02 NOTE — Progress Notes (Signed)
    Subjective:  Patricia Lloyd is a 54 y.o. female who presents to the [facility] today with a chief complaint of left arm pain.   HPI: Left Arm Pain Patient presents with left arm pain that has been present for the last 3 weeks. Of note, patient has a history of bilateral rotator cuff injuries with repairs. Patient also reports a history of bulged discs in her neck about 5 years ago that improved with physical therapy. 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 Also describes numbness that radiates into her fingers. Patient has been doing neck exercised at home which have not significantly helped. Patient has tried ibuprofen which has helped some.   Work in dress shop requires lifting overhead and that may have triggered sxs   Objective:  Physical Exam: BP 125/84 mmHg  Pulse 67  Ht  (1.626 m)  Wt 132 lb (59.875 kg)  BMI 22.65 kg/m2  Gen: NAD, resting comfortably - pain with neck movement though Lungs: NWOB MSK: - Left upper extremity: Strength 5/5 in all directions in shoulder, elbow flexion, and lumbricals. Decreased sensation along ulnar aspect of forearm radiating into hand. Diminished triceps reflex. Brisk biceps reflex.  -Right upper extremity: Strength 5/5 throughout. Normal sensation. Normal triceps and biceps reflex. -Neck: No deformities. FROM. Pain with flexion. Positive left sided Spurling. Negative right sided Spurling.  Skin: warm, dry Neuro: grossly normal, moves all extremities Psych: Normal affect and thought content  Imaging: MRI 2012:  Mild bulging at C5-6 and C4-5. Mild Facet arthropathy at C7-T1   Assessment/Plan:  Left arm pain Consistent with cervical radiculopathy. No motor function loss. Will treat with gabapentin  at night and titrate up to  tid. Will also start tramadol for  pain. Discussed neck exercises to help alleviate some nerve irritation. Will follow up in 4 weeks. If pain improved, will consider starting physical therapy.    Katina Degree. Jimmey Ralph, MD [facility] Resident PGY-2 09/02/2014 12:31 PM   Agree with plan.  Will need to go ahead with repeat MRI if she does nor respond to Tx plan we have started.  Sterling Big, MD

## 2014-09-02 NOTE — Patient Instructions (Signed)
Your arm pain is coming from your neck. We will start two new medications today.  Please take gabapentin  at night for 5 days. If this goes well, please start taking  at night and  in the morning for 5 days. If this goes well, please take  three times a day until you follow up.  Please take tramadol up to 3 times a day as needed for your pain.   Please follow up in 4 weeks.

## 2014-09-03 DIAGNOSIS — M5412 Radiculopathy, cervical region: Secondary | ICD-10-CM | POA: Insufficient documentation

## 2014-09-03 NOTE — Assessment & Plan Note (Signed)
Gabapentin Tramadol Relative rest  Easy home exercise until pain is less and then progress to PT

## 2014-09-07 ENCOUNTER — Encounter: Payer: Self-pay | Admitting: *Deleted

## 2014-09-08 ENCOUNTER — Other Ambulatory Visit: Payer: Self-pay | Admitting: *Deleted

## 2014-09-08 ENCOUNTER — Telehealth: Payer: Self-pay | Admitting: *Deleted

## 2014-09-08 DIAGNOSIS — M5412 Radiculopathy, cervical region: Secondary | ICD-10-CM

## 2014-09-08 NOTE — Telephone Encounter (Signed)
Spoke with patient about concerns and Dr. Darrick Penna recommends that we setup a cervical spine xray and then depending on results we may need to get an MRI. Let pt know to go in for the xray and also to continue with the tramadol and gabapentin.

## 2014-09-08 NOTE — Telephone Encounter (Signed)
-----   Message from Patricia Lloyd sent at 09/07/2014  8:54 AM EDT ----- Regarding: phone message Contact: [phone] Pt called stating her left arm/leg and face is numb. She believes it is from the medication that Dr. Darrick Penna put her on last week. Please call her after 10am.

## 2014-09-11 ENCOUNTER — Ambulatory Visit
Admission: RE | Admit: 2014-09-11 | Discharge: 2014-09-11 | Disposition: A | Discharge status: Home / Self Care | Payer: BLUE CROSS/BLUE SHIELD | Source: Ambulatory Visit | Attending: Sports Medicine | Admitting: Sports Medicine

## 2014-09-11 DIAGNOSIS — M5412 Radiculopathy, cervical region: Secondary | ICD-10-CM

## 2014-09-17 ENCOUNTER — Other Ambulatory Visit: Payer: Self-pay

## 2014-09-18 LAB — CYTOLOGY - PAP

## 2014-09-19 ENCOUNTER — Other Ambulatory Visit: Payer: Self-pay | Admitting: Sports Medicine

## 2014-09-21 ENCOUNTER — Other Ambulatory Visit: Payer: Self-pay | Admitting: *Deleted

## 2014-09-21 MED ORDER — GABAPENTIN 300 MG PO CAPS: 300.0000 mg | ORAL_CAPSULE | Freq: Three times a day (TID) | ORAL | Status: DC

## 2014-09-24 ENCOUNTER — Encounter: Payer: Self-pay | Admitting: Sports Medicine

## 2014-09-24 ENCOUNTER — Ambulatory Visit (INDEPENDENT_AMBULATORY_CARE_PROVIDER_SITE_OTHER): Payer: BLUE CROSS/BLUE SHIELD | Admitting: Sports Medicine

## 2014-09-24 VITALS — BP 114/79 | Ht 64.0 in | Wt 132.0 lb

## 2014-09-24 DIAGNOSIS — M5412 Radiculopathy, cervical region: Secondary | ICD-10-CM

## 2014-09-24 MED ORDER — DIAZEPAM 5 MG PO TABS: 5.0000 mg | ORAL_TABLET | Freq: Every evening | ORAL | Status: DC | PRN

## 2014-09-24 NOTE — Assessment & Plan Note (Signed)
This is key problem Not likely to completely resolve unless she can get more rest Take more gabapentin when possible Try to build steadily to 600 tid  Add diazepam at night to see if she can get sleep and MM relaxation  Reck 4 wks

## 2014-09-24 NOTE — Progress Notes (Signed)
Patient ID: Patricia Lloyd, female   DOB: 1960-03-27, 54 y.o.   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

## 2014-09-29 ENCOUNTER — Encounter: Payer: Self-pay | Admitting: Internal Medicine

## 2014-09-29 ENCOUNTER — Ambulatory Visit: Payer: BLUE CROSS/BLUE SHIELD | Admitting: Internal Medicine

## 2014-10-20 ENCOUNTER — Ambulatory Visit: Payer: BLUE CROSS/BLUE SHIELD | Admitting: Sports Medicine

## 2014-11-04 ENCOUNTER — Ambulatory Visit (INDEPENDENT_AMBULATORY_CARE_PROVIDER_SITE_OTHER): Payer: BLUE CROSS/BLUE SHIELD | Admitting: Sports Medicine

## 2014-11-04 ENCOUNTER — Encounter: Payer: Self-pay | Admitting: Sports Medicine

## 2014-11-04 VITALS — BP 115/80 | HR 64 | Ht 64.0 in | Wt 132.0 lb

## 2014-11-04 DIAGNOSIS — M7582 Other shoulder lesions, left shoulder: Secondary | ICD-10-CM

## 2014-11-04 DIAGNOSIS — M5412 Radiculopathy, cervical region: Secondary | ICD-10-CM | POA: Diagnosis not present

## 2014-11-04 NOTE — Progress Notes (Signed)
Patient ID: Patricia Lloyd, female   DOB: 04/10/1960, 54 y.o.   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 Yisroel RammingalDorf in the past Since my last visit her shoulder started hurting and she could not elevate She saw Dr. Yisroel RammingalDorf and had an injection that is helped her regain normal motion  The numbness and burning pain down her left arm does improve with gabapentin However she has been very irregular in taking this because of drowsiness The overall numbness is improved since now only involves fingers 4 and 5 She still has some hypersensitivity around the left axilla  Review of systems Poor sleep pattern, periodic night pain in the shoulder particularly positional, no weakness in the left hand, neck pain if she sits or lies in the wrong position  Physical examination No acute distress BP 115/80 mmHg  Pulse 64  Ht 5\' 4"  (1.626 m)  Wt 132 lb (59.875 kg)  BMI 22.65 kg/m2  Full range of motion of the neck and better posture Strength testing C5-T1 is normal Some sensory loss along the left fourth and fifth fingers and the medial arm  Shoulder range of motion is full Excellent strength on rotator cuff testing Negative signs of impingement

## 2014-11-04 NOTE — Assessment & Plan Note (Signed)
She still has significant calcium deposits in the left supraspinatous and some in the infraspinatus  Keep her range of motion good  If this gets worse we can try injection or even go ahead with barbotage

## 2014-11-04 NOTE — Assessment & Plan Note (Signed)
This is gradually improving  If I can get her to consistently use gabapentin at night and to start vitamin B6 I believe she will see faster healing  Be careful about neck position  Recheck 3 months

## 2014-11-04 NOTE — Patient Instructions (Signed)
Use Vitamin B 6 100 mgm daily  Stay on gabapentin 300 at night  Keep easy neck motion  Keep easy shoulder motion  Good posture for neck  Careful for lifting overhead  I want to see you in February

## 2014-12-17 ENCOUNTER — Other Ambulatory Visit: Payer: Self-pay | Admitting: Sports Medicine

## 2015-04-20 ENCOUNTER — Telehealth: Payer: Self-pay | Admitting: *Deleted

## 2015-04-20 NOTE — Telephone Encounter (Signed)
Patient called requesting that a antibiotic be called in to the pharmacy for a sinus infection. I informed her that it was against office policy to send in a antibiotic without being seen by a doctor. She stated that she would have to go to a n doc in a box because she could not be seen by someone in the office before she left on her trip. I informed [facility] but she stated that she had to  leave for a flight that morning and could not make any of those appointments.

## 2015-04-21 ENCOUNTER — Encounter: Payer: Self-pay | Admitting: Internal Medicine

## 2015-04-21 ENCOUNTER — Ambulatory Visit (INDEPENDENT_AMBULATORY_CARE_PROVIDER_SITE_OTHER): Payer: BLUE CROSS/BLUE SHIELD | Admitting: Internal Medicine

## 2015-04-21 ENCOUNTER — Ambulatory Visit: Payer: BLUE CROSS/BLUE SHIELD | Admitting: Internal Medicine

## 2015-04-21 VITALS — BP 122/76 | HR 74 | Temp 98.3°F | Ht 64.0 in | Wt 140.0 lb

## 2015-04-21 DIAGNOSIS — J0100 Acute maxillary sinusitis, unspecified: Secondary | ICD-10-CM

## 2015-04-21 DIAGNOSIS — J019 Acute sinusitis, unspecified: Secondary | ICD-10-CM | POA: Insufficient documentation

## 2015-04-21 MED ORDER — AMOXICILLIN 875 MG PO TABS
ORAL_TABLET | ORAL | Status: DC
Start: 2015-04-21 — End: 2015-09-23

## 2015-04-21 MED ORDER — CETIRIZINE-PSEUDOEPHEDRINE ER 5-120 MG PO TB12: ORAL_TABLET | ORAL | Status: AC

## 2015-04-21 NOTE — Progress Notes (Signed)
Patient ID: Patricia Lloyd, female   DOB: 06/13/1960, 55 y.o.   MRN: [id number]    Facility  [facility]    Place of Service:   OFFICE    No Known Allergies  Chief Complaint  Patient presents with  . Acute Visit    right side of face tender, swelling. (R) ear and teeth hurts for 3 days    HPI:  Acute maxillary sinusitis, recurrence not specified -  Onset about 4 days ago. Has been using warm compresses and taking Mucinex without relief. Pain is increased and facial swelling has increased to the point that she feels stiff in her cheeks and on her right upper lip. Denies loss of hearing or tinnitus. Has had no sense of pain in the gums or teeth. No past history of sinusitis. Denies fever.    Medications: Patient's Medications  New Prescriptions   No medications on file  Previous Medications   ALPRAZOLAM (XANAX) 0.25 MG TABLET    Take 1 tablet (0.25 mg total) by mouth 2 (two) times daily as needed for anxiety.   B COMPLEX VITAMINS (B COMPLEX PO)    Take by mouth.   BIOTIN FORTE PO    Take by mouth.   ESTERIFIED ESTROGENS PO    Take by mouth.   IBUPROFEN (ADVIL,MOTRIN) 200 MG TABLET    Take 400 mg by mouth every 6 (six) hours as needed for headache.   MAGNESIUM GLUCONATE (MAGONATE) 500 MG TABLET    Take 500 mg by mouth 2 (two) times daily.   MULTIPLE VITAMINS-MINERALS (MULTI COMPLETE PO)    Take by mouth.   OVER THE COUNTER MEDICATION    Cellulegs   POLYETHYL GLYCOL-PROPYL GLYCOL (SYSTANE OP)    Apply 1 drop to eye 2 (two) times daily as needed (For dry  eyes).   PROGESTERONE MICRONIZED (PROGESTERONE PO)    Take by mouth.  Modified Medications   No medications on file  Discontinued Medications   AMITRIPTYLINE (ELAVIL) 25 MG TABLET    Take 1 tablet (25 mg total) by mouth at bedtime.   DIAZEPAM (VALIUM) 5 MG TABLET    Take 1 tablet (5 mg total) by mouth at bedtime as needed for anxiety.   DULOXETINE (CYMBALTA) 30 MG CAPSULE    One daly to pains from fibromyalgia   GABAPENTIN  (NEURONTIN) 300 MG CAPSULE    Take 1 capsule (300 mg total) by mouth 3 (three) times daily.   NITROGLYCERIN (NITRODUR - DOSED IN MG/24 HR) 0.2 MG/HR PATCH    UNWRAP AND APPLY 1/4 PATCH TO TO THE AFFECTED AREA ONCE DAILY.   TRAMADOL (ULTRAM) 50 MG TABLET    Take 1 tablet (50 mg total) by mouth every 6 (six) hours as needed.    Review of Systems  Constitutional: Negative for fever and chills.  HENT: Positive for congestion, facial swelling (Right maxillary sinus area) and sinus pressure. Negative for dental problem, ear discharge, ear pain, hearing loss, mouth sores, postnasal drip, rhinorrhea, sore throat, tinnitus, trouble swallowing and voice change.   Eyes: Negative for pain and redness.  Respiratory: Negative for cough and shortness of breath.   Cardiovascular: Positive for leg swelling (worse). Negative for chest pain and palpitations.       Reports she can hear her heartbeat in her ears  Gastrointestinal: Positive for diarrhea (from stress). Negative for nausea and vomiting.  Genitourinary: Negative for dysuria, urgency and frequency.  Musculoskeletal: Negative for myalgias.  Skin: Negative.   Neurological: Positive for dizziness.  Negative for weakness and headaches.       Hands feel numb at times  Psychiatric/Behavioral: The patient is nervous/anxious.     Filed Vitals:   04/21/15 1146  BP: 122/76  Pulse: 74  Temp: 98.3 F (36.8 C)  TempSrc: Oral  Height: '5\' 4"'  (1.626 m)  Weight: 140 lb (63.504 kg)  SpO2: 96%   Body mass index is 24.02 kg/(m^2). Filed Weights   04/21/15 1146  Weight: 140 lb (63.504 kg)     Physical Exam  Constitutional: She is oriented to person, place, and time. She appears well-developed and well-nourished. No distress.  HENT:  Right Ear: External ear normal.  Left Ear: External ear normal.  Nose: Nose normal.  Mouth/Throat: Oropharynx is clear and moist. No oropharyngeal exudate.  Pain and swelling in the right maxillary sinus area. Examination  of the mouth is unremarkable. No erythema surrounding teeth and gums.  Eyes: Conjunctivae and EOM are normal. Pupils are equal, round, and reactive to light. No scleral icterus.  Neck: No JVD present. No tracheal deviation present. No thyromegaly present.  Cardiovascular: Normal rate, regular rhythm, normal heart sounds and intact distal pulses.  Exam reveals no gallop and no friction rub.   No murmur heard. Pulmonary/Chest: Effort normal. No respiratory distress. She has no wheezes. She has no rales. She exhibits no tenderness.  Abdominal: She exhibits no distension and no mass. There is no tenderness.  Musculoskeletal: Normal range of motion. She exhibits edema (mild and more on the right side). She exhibits no tenderness.  Lymphadenopathy:    She has no cervical adenopathy.  Neurological: She is alert and oriented to person, place, and time. No cranial nerve deficit. Coordination normal.  Skin: No rash noted. She is not diaphoretic. No erythema. No pallor.  Psychiatric: She has a normal mood and affect. Her behavior is normal. Judgment and thought content normal.    Labs reviewed: Lab Summary Latest Ref Rng 05/29/2014  Hemoglobin 11.1 - 15.9 g/dL 12.2  Hematocrit 34.0 - 46.6 % 37.3  White count 3.4 - 10.8 x10E3/uL 7.1  Platelet count 150 - 379 x10E3/uL 222  Sodium 134 - 144 mmol/L 138  Potassium 3.5 - 5.2 mmol/L 4.3  Calcium 8.7 - 10.2 mg/dL 8.9  Phosphorus - (None)  Creatinine 0.57 - 1.00 mg/dL 0.78  AST 0 - 40 IU/L 22  Alk Phos 39 - 117 IU/L 39  Bilirubin 0.0 - 1.2 mg/dL 0.4  Glucose 65 - 99 mg/dL 83  Cholesterol - (None)  HDL cholesterol >39 mg/dL 79  Triglycerides 0 - 149 mg/dL 51  LDL Direct - (None)  LDL Calc 0 - 99 mg/dL 93  Total protein - (None)  Albumin 3.5 - 5.5 g/dL 4.0   Lab Results  Component Value Date   TSH 2.280 05/29/2014   TSH 2.200 11/27/2012   Lab Results  Component Value Date   BUN 13 05/29/2014   BUN 12 11/27/2012   No results found for:  HGBA1C  Assessment/Plan  1. Acute maxillary sinusitis, recurrence not specified - amoxicillin (AMOXIL) 875 MG tablet; One twice daily for infection  Dispense: 20 tablet; Refill: 1 - cetirizine-pseudoephedrine (ZYRTEC-D) 5-120 MG tablet; One twice daily for congestion  Dispense: 20 tablet; Refill: 1

## 2015-04-22 ENCOUNTER — Ambulatory Visit: Payer: BLUE CROSS/BLUE SHIELD | Admitting: Nurse Practitioner

## 2015-04-22 ENCOUNTER — Other Ambulatory Visit: Payer: Self-pay | Admitting: Otolaryngology

## 2015-04-22 DIAGNOSIS — R220 Localized swelling, mass and lump, head: Secondary | ICD-10-CM

## 2015-04-22 DIAGNOSIS — K047 Periapical abscess without sinus: Secondary | ICD-10-CM

## 2015-04-23 ENCOUNTER — Ambulatory Visit
Admission: RE | Admit: 2015-04-23 | Discharge: 2015-04-23 | Disposition: A | Discharge status: Home / Self Care | Payer: BLUE CROSS/BLUE SHIELD | Source: Ambulatory Visit | Attending: Otolaryngology | Admitting: Otolaryngology

## 2015-04-23 DIAGNOSIS — R220 Localized swelling, mass and lump, head: Secondary | ICD-10-CM

## 2015-04-23 DIAGNOSIS — K047 Periapical abscess without sinus: Secondary | ICD-10-CM

## 2015-09-02 ENCOUNTER — Other Ambulatory Visit: Payer: Self-pay | Admitting: Internal Medicine

## 2015-09-02 DIAGNOSIS — M797 Fibromyalgia: Secondary | ICD-10-CM

## 2015-09-02 NOTE — Progress Notes (Signed)
Patient called stating that she needed an urgent [facility] she states that her hips are hurting so bad that she can barely get up out of a chair or in and out of a car. She is sleeping just on her back because the pain from the hips wakes her if she lays on her side. It seems to be getting worse according to the patient.

## 2015-09-06 ENCOUNTER — Telehealth: Payer: Self-pay | Admitting: Neurology

## 2015-09-06 ENCOUNTER — Telehealth: Payer: Self-pay | Admitting: Internal Medicine

## 2015-09-06 ENCOUNTER — Other Ambulatory Visit: Payer: Self-pay | Admitting: Internal Medicine

## 2015-09-06 DIAGNOSIS — M797 Fibromyalgia: Secondary | ICD-10-CM

## 2015-09-06 DIAGNOSIS — R42 Dizziness and giddiness: Secondary | ICD-10-CM

## 2015-09-06 NOTE — Telephone Encounter (Signed)
Patient called to schedule appointment with Dr. Anne HahnWillis for numbness and back pain in hips and back, has been told this is neurological. Patient last seen 12/25/12, No Show appointment with Karin GoldenLorraine 12/30/12. Patient advised will need new referral from PCP for this issue, once referral is received, appointment can be scheduled.

## 2015-09-06 NOTE — Telephone Encounter (Signed)
Order was placed. She has also asked for rheumatology referral.

## 2015-09-07 ENCOUNTER — Telehealth: Payer: Self-pay | Admitting: *Deleted

## 2015-09-07 DIAGNOSIS — M25552 Pain in left hip: Principal | ICD-10-CM

## 2015-09-07 DIAGNOSIS — M25551 Pain in right hip: Secondary | ICD-10-CM

## 2015-09-07 NOTE — Telephone Encounter (Signed)
Order placed and patient given the number to Dr. Oliva Bustardohmeier's office to call if she has not heard from them in a few days

## 2015-09-09 ENCOUNTER — Telehealth: Payer: Self-pay | Admitting: Internal Medicine

## 2015-09-09 NOTE — Telephone Encounter (Signed)
FYI, Received a fax back from [facility] stating they do not treat Fibromyalgia - Called patient to update her on the information I received. Patient was very upset stating it was ludicrous that no one will see her, She stated no one knows that she has fibromyalgia and that isn't the reason why she can't get out of a chair. I offered to let her speak to someone in clinical to try to further advise her but she stated it wouldn't do any good and she didn't know what else to do. She said maybe she will go to the ER? I again offered to let her speak to clinical but she declined and stated she would let Dr. Chilton SiGreen know and let him know how un happy she is. She thanked me for my efforts and hung up.

## 2015-09-20 NOTE — Telephone Encounter (Signed)
I placed a call to patient on Friday 09/17/15. She sent me a text message that she was too busy to talk. Did not receive a call back. My intent in placing the call was to see if there was anything else I could try to do for her.

## 2015-09-23 ENCOUNTER — Ambulatory Visit (INDEPENDENT_AMBULATORY_CARE_PROVIDER_SITE_OTHER): Payer: BLUE CROSS/BLUE SHIELD | Admitting: Neurology

## 2015-09-23 ENCOUNTER — Encounter: Payer: Self-pay | Admitting: Neurology

## 2015-09-23 VITALS — BP 124/70 | HR 90 | Ht 64.0 in | Wt 140.7 lb

## 2015-09-23 DIAGNOSIS — M797 Fibromyalgia: Secondary | ICD-10-CM

## 2015-09-23 NOTE — Patient Instructions (Signed)
   We will get blood work today and get EMG and NCV evaluation of the legs.

## 2015-09-23 NOTE — Progress Notes (Addendum)
Reason for visit: Arthralgias  Referring physician: Dr. Gustavus Messing Patricia Lloyd is a 55 y.o. female  History of present illness:  Patricia Lloyd is a 55 year old right-handed white female with a history of fibromyalgia was diagnosed by Dr. Corliss Skains 15 years ago. The patient has had neuromuscular discomfort for quite a number of years, but she indicates that she is having a new type of discomfort at this time over the last 6 months. The patient began having right hip pain and underwent an injection through her orthopedic surgeon which did seem to help some. The patient has developed left-sided hip pain more recently, she has some discomfort that goes down the left leg to the ankle area. She may have some numbness in the lateral aspects of the feet and toes. The patient reports discomfort in the back, she has pain in the wrists and elbows, and in the shoulders bilaterally. She has had bilateral shoulder surgery previously. The patient reports that she feels stiff all over, she is unable to stay in a seated position for extended periods of time. The patient denies any definite weakness of the extremities or troubles controlling the bowels, but she does have some stress incontinence of the bladder at times. She denies any balance issues. She comes to this office for an evaluation. The patient has had chronic insomnia for several decades.  Past Medical History:  Diagnosis Date  . Dizziness and giddiness 12/25/2012  . Fibromyalgia   . H/O fatigue   . Insomnia   . PONV (postoperative nausea and vomiting)    severe  . Poor circulation    wears support stockings    Past Surgical History:  Procedure Laterality Date  . CESAREAN SECTION  1995  . CESAREAN SECTION  2000  . DILATATION & CURETTAGE/HYSTEROSCOPY WITH TRUECLEAR N/A 01/01/2013   Procedure: DILATATION & CURETTAGE/HYSTEROSCOPY WITH TRUCLEAR;  Surgeon: Meriel Pica, MD;  Location: [facility];  Service: Gynecology;  Laterality: N/A;  .  SHOULDER SURGERY  05/03/2010   Left shoulder surgery, Dr.Doldorf    . TMJ ARTHROPLASTY  1990    Family History  Problem Relation Age of Onset  . Cancer Mother     Pancreatic   . Cancer Maternal Aunt     Social history:  reports that she has quit smoking. She has never used smokeless tobacco. She reports that she drinks alcohol. She reports that she does not use drugs.  Medications:  Prior to Admission medications   Medication Sig Start Date End Date Taking? Authorizing Provider  ALPRAZolam (XANAX) 0.25 MG tablet Take 1 tablet (0.25 mg total) by mouth 2 (two) times daily as needed for anxiety. 06/10/14  Yes Kimber Relic, MD  B Complex Vitamins (B COMPLEX PO) Take by mouth.   Yes Historical Provider, MD  BIOTIN FORTE PO Take by mouth.   Yes Historical Provider, MD  cetirizine-pseudoephedrine (ZYRTEC-D) 5-120 MG tablet One twice daily for congestion 04/21/15  Yes Kimber Relic, MD  ESTERIFIED ESTROGENS PO Take by mouth.   Yes Historical Provider, MD  magnesium gluconate (MAGONATE) 500 MG tablet Take 500 mg by mouth 2 (two) times daily.   Yes Historical Provider, MD  Multiple Vitamins-Minerals (MULTI COMPLETE PO) Take by mouth.   Yes Historical Provider, MD  naproxen sodium (ANAPROX) 220 MG tablet Take 220 mg by mouth 2 (two) times daily as needed.   Yes Historical Provider, MD  OVER THE COUNTER MEDICATION Cellulegs   Yes Historical Provider, MD  Progesterone Micronized (  PROGESTERONE PO) Take by mouth.   Yes Historical Provider, MD     No Known Allergies  ROS:  Out of a complete 14 system review of symptoms, the patient complains only of the following symptoms, and all other reviewed systems are negative.  Weight gain Swelling in the legs Easy bruising, lymph node swelling Joint pain, achy muscles Skin sensitivity Numbness, weakness Depression, anxiety, not enough sleep, decreased energy, disinterest in activities Insomnia, sleepiness  Blood pressure 124/70, pulse 90, height  5\' 4"  (1.626 m), weight 140 lb 11.8 oz (63.8 kg).  Physical Exam  General: The patient is alert and cooperative at the time of the examination.  Eyes: Pupils are equal, round, and reactive to light. Discs are flat bilaterally.  Neck: The neck is supple, no carotid bruits are noted.  Respiratory: The respiratory examination is clear.  Cardiovascular: The cardiovascular examination reveals a regular rate and rhythm, no obvious murmurs or rubs are noted.  Neuromuscular: The patient has good range of movement the cervical and lumbar spines.  Skin: Extremities are without significant edema.  Neurologic Exam  Mental status: The patient is alert and oriented x 3 at the time of the examination. The patient has apparent normal recent and remote memory, with an apparently normal attention span and concentration ability.  Cranial nerves: Facial symmetry is present. There is a decrease in pinprick sensation on the left face as compared to the right, the patient splits the midline with vibration sensation on the forehead, decreased on the left. The strength of the facial muscles and the muscles to head turning and shoulder shrug are normal bilaterally. Speech is well enunciated, no aphasia or dysarthria is noted. Extraocular movements are full. Visual fields are full. The tongue is midline, and the patient has symmetric elevation of the soft palate. No obvious hearing deficits are noted.  Motor: The motor testing reveals 5 over 5 strength of all 4 extremities. Good symmetric motor tone is noted throughout.  Sensory: Sensory testing is notable for a decrease in pinprick sensation on the left arm and leg as compared to the right, some decrease in vibration sensation is noted on the left foot compared to the right, symmetric in arms. Position sense is intact on all 4 extremities. No evidence of extinction is noted.  Coordination: Cerebellar testing reveals good finger-nose-finger and heel-to-shin  bilaterally.  Gait and station: Gait is normal. Tandem gait is normal. Romberg is negative. No drift is seen.  Reflexes: Deep tendon reflexes are symmetric and normal bilaterally, with exception that the ankle jerk reflexes are decreased bilaterally. Toes are downgoing bilaterally.   Assessment/Plan:  1. History of fibromyalgia  2. Diffuse arthralgias, left hip pain and left leg pain  3. Nonorganic neurologic examination  The patient has a left sided sensory deficit, splits the midline of the forehead with vibration sensation suggesting a nonorganic sensory examination. The patient is reporting diffuse arthralgias. We will check blood work today. The patient will undergo nerve conduction studies on both legs, EMG on the left leg. We may consider MRI of the lumbar spine at some point. I have discussed placing the patient on a medication for fibromyalgia, she does not wish to start any other drugs. She will follow-up for the EMG evaluation.  Patricia Lloyd. Keith Aijalon Kirtz MD 09/23/2015 9:51 AM  [facility] [address] [place], [place] [id number]  Phone [phone] Fax [phone]

## 2015-09-24 LAB — ANA W/REFLEX: ANA: NEGATIVE

## 2015-09-24 LAB — ANGIOTENSIN CONVERTING ENZYME: ANGIO CONVERT ENZYME: 80 U/L (ref 14–82)

## 2015-09-24 LAB — CK: Total CK: 86 U/L (ref 24–173)

## 2015-09-24 LAB — SEDIMENTATION RATE: Sed Rate: 5 mm/hr (ref 0–40)

## 2015-09-24 LAB — RHEUMATOID FACTOR: Rhuematoid fact SerPl-aCnc: <10 IU/mL (ref 0.0–13.9)

## 2015-09-24 LAB — B. BURGDORFI ANTIBODIES

## 2015-09-27 ENCOUNTER — Telehealth: Payer: Self-pay

## 2015-09-27 NOTE — Telephone Encounter (Signed)
-----   Message from York Spanielharles K Willis, MD sent at 09/25/2015  9:28 AM EDT -----  The blood work results are unremarkable. Please call the patient.  ----- Message ----- From: Nell RangeInterface, [facility] Lab Results In Sent: 09/24/2015   7:43 AM To: York Spanielharles K Willis, MD

## 2015-09-27 NOTE — Telephone Encounter (Signed)
Called pt w/ unremarkable lab results. May call back w/ additional questions/concerns. 

## 2015-10-12 ENCOUNTER — Ambulatory Visit (INDEPENDENT_AMBULATORY_CARE_PROVIDER_SITE_OTHER): Payer: Self-pay | Admitting: Neurology

## 2015-10-12 ENCOUNTER — Ambulatory Visit (INDEPENDENT_AMBULATORY_CARE_PROVIDER_SITE_OTHER): Payer: BLUE CROSS/BLUE SHIELD | Admitting: Neurology

## 2015-10-12 ENCOUNTER — Encounter: Payer: Self-pay | Admitting: Neurology

## 2015-10-12 DIAGNOSIS — M797 Fibromyalgia: Secondary | ICD-10-CM | POA: Diagnosis not present

## 2015-10-12 DIAGNOSIS — M79605 Pain in left leg: Secondary | ICD-10-CM

## 2015-10-12 MED ORDER — GABAPENTIN 100 MG PO CAPS: 100.0000 mg | ORAL_CAPSULE | Freq: Three times a day (TID) | ORAL | 1 refills | Status: DC

## 2015-10-12 NOTE — Progress Notes (Signed)
Please refer to EMG and nerve conduction study procedure note. 

## 2015-10-12 NOTE — Procedures (Signed)
     HISTORY:  Patricia SievertMaribeth Denes is a 55 year old patient with the history of diffuse neuromuscular discomfort, and a history of left hip and left leg discomfort. The patient is being evaluated for these symptoms.  NERVE CONDUCTION STUDIES:  Nerve conduction studies were performed on both lower extremities. The distal motor latencies and motor amplitudes for the peroneal and posterior tibial nerves were within normal limits. The nerve conduction velocities for these nerves were also normal. The H reflex latencies were normal. The sensory latencies for the peroneal nerves were within normal limits.   EMG STUDIES:  EMG study was performed on the left lower extremity:  The tibialis anterior muscle reveals 2 to 4K motor units with full recruitment. No fibrillations or positive waves were seen. The peroneus tertius muscle reveals 2 to 4K motor units with full recruitment. No fibrillations or positive waves were seen. The medial gastrocnemius muscle reveals 1 to 3K motor units with full recruitment. No fibrillations or positive waves were seen. The vastus lateralis muscle reveals 2 to 4K motor units with full recruitment. No fibrillations or positive waves were seen. The iliopsoas muscle reveals 2 to 4K motor units with full recruitment. No fibrillations or positive waves were seen. The biceps femoris muscle (long head) reveals 2 to 4K motor units with full recruitment. No fibrillations or positive waves were seen. The lumbosacral paraspinal muscles were tested at 3 levels, and revealed no abnormalities of insertional activity at all 3 levels tested. There was good relaxation.   IMPRESSION:  Nerve conduction studies done on both lower extremities were within normal limits. No evidence of a peripheral neuropathy was seen. EMG evaluation of the left lower extremity was unremarkable, without evidence of an overlying lumbosacral radiculopathy.  Marlan Palau. Keith Lean Fayson MD 10/12/2015 1:50 PM  [facility] [address] [place], [place] [id number]  Phone [phone] Fax [phone]

## 2015-10-12 NOTE — Progress Notes (Signed)
EMG and nerve conduction study evaluation done today was unremarkable. The patient continues to have ongoing discomfort, we will try low-dose gabapentin for the discomfort. The patient likely has fibromyalgia.  She will follow-up in 4 or 5 months.

## 2016-02-08 DIAGNOSIS — F4323 Adjustment disorder with mixed anxiety and depressed mood: Secondary | ICD-10-CM | POA: Diagnosis not present

## 2016-02-09 DIAGNOSIS — Z23 Encounter for immunization: Secondary | ICD-10-CM | POA: Diagnosis not present

## 2016-02-09 DIAGNOSIS — Z0001 Encounter for general adult medical examination with abnormal findings: Secondary | ICD-10-CM | POA: Diagnosis not present

## 2016-02-09 DIAGNOSIS — M2550 Pain in unspecified joint: Secondary | ICD-10-CM | POA: Diagnosis not present

## 2016-02-09 DIAGNOSIS — Z803 Family history of malignant neoplasm of breast: Secondary | ICD-10-CM | POA: Diagnosis not present

## 2016-02-09 DIAGNOSIS — T3110 Burns involving 10-19% of body surface with 0% to 9% third degree burns: Secondary | ICD-10-CM | POA: Diagnosis not present

## 2016-02-14 DIAGNOSIS — F4323 Adjustment disorder with mixed anxiety and depressed mood: Secondary | ICD-10-CM | POA: Diagnosis not present

## 2016-02-15 ENCOUNTER — Telehealth: Payer: Self-pay | Admitting: Genetic Counselor

## 2016-02-15 ENCOUNTER — Encounter: Payer: Self-pay | Admitting: Genetic Counselor

## 2016-02-15 NOTE — Telephone Encounter (Signed)
Appt has been scheduld for the pt to see a genetic counselor on 3/5 @10 :15am. Pt aware to arrive 30 minutes early. Demographics verified. Letter mailed.

## 2016-02-16 ENCOUNTER — Ambulatory Visit: Payer: BLUE CROSS/BLUE SHIELD | Admitting: Nurse Practitioner

## 2016-02-22 DIAGNOSIS — F5104 Psychophysiologic insomnia: Secondary | ICD-10-CM | POA: Diagnosis not present

## 2016-02-22 DIAGNOSIS — F419 Anxiety disorder, unspecified: Secondary | ICD-10-CM | POA: Diagnosis not present

## 2016-02-22 DIAGNOSIS — T300 Burn of unspecified body region, unspecified degree: Secondary | ICD-10-CM | POA: Diagnosis not present

## 2016-02-22 DIAGNOSIS — Z803 Family history of malignant neoplasm of breast: Secondary | ICD-10-CM | POA: Diagnosis not present

## 2016-03-02 DIAGNOSIS — L237 Allergic contact dermatitis due to plants, except food: Secondary | ICD-10-CM | POA: Diagnosis not present

## 2016-03-10 ENCOUNTER — Other Ambulatory Visit: Payer: Self-pay | Admitting: Medical Oncology

## 2016-03-13 ENCOUNTER — Other Ambulatory Visit: Payer: BLUE CROSS/BLUE SHIELD

## 2016-03-13 ENCOUNTER — Telehealth: Payer: Self-pay | Admitting: Genetic Counselor

## 2016-03-13 NOTE — Telephone Encounter (Signed)
Pt has been rescheduled to see Maylon CosKaren Powell to 4/18 at 2pm. She has agreed to the appt date and time.

## 2016-04-05 DIAGNOSIS — N390 Urinary tract infection, site not specified: Secondary | ICD-10-CM | POA: Diagnosis not present

## 2016-04-05 DIAGNOSIS — F4323 Adjustment disorder with mixed anxiety and depressed mood: Secondary | ICD-10-CM | POA: Diagnosis not present

## 2016-04-26 ENCOUNTER — Encounter: Payer: BLUE CROSS/BLUE SHIELD | Admitting: Genetic Counselor

## 2016-04-26 ENCOUNTER — Other Ambulatory Visit: Payer: BLUE CROSS/BLUE SHIELD

## 2016-05-03 ENCOUNTER — Ambulatory Visit: Payer: BLUE CROSS/BLUE SHIELD | Admitting: Neurology

## 2016-05-08 DIAGNOSIS — F4323 Adjustment disorder with mixed anxiety and depressed mood: Secondary | ICD-10-CM | POA: Diagnosis not present

## 2016-05-18 DIAGNOSIS — F4323 Adjustment disorder with mixed anxiety and depressed mood: Secondary | ICD-10-CM | POA: Diagnosis not present

## 2016-05-25 DIAGNOSIS — N950 Postmenopausal bleeding: Secondary | ICD-10-CM | POA: Diagnosis not present

## 2016-05-30 DIAGNOSIS — F4323 Adjustment disorder with mixed anxiety and depressed mood: Secondary | ICD-10-CM | POA: Diagnosis not present

## 2016-06-07 DIAGNOSIS — Z800 Family history of malignant neoplasm of digestive organs: Secondary | ICD-10-CM | POA: Diagnosis not present

## 2016-06-07 DIAGNOSIS — Z1211 Encounter for screening for malignant neoplasm of colon: Secondary | ICD-10-CM | POA: Diagnosis not present

## 2016-06-07 DIAGNOSIS — K5730 Diverticulosis of large intestine without perforation or abscess without bleeding: Secondary | ICD-10-CM | POA: Diagnosis not present

## 2016-06-14 ENCOUNTER — Ambulatory Visit (INDEPENDENT_AMBULATORY_CARE_PROVIDER_SITE_OTHER): Payer: BLUE CROSS/BLUE SHIELD | Admitting: Neurology

## 2016-06-14 ENCOUNTER — Encounter: Payer: Self-pay | Admitting: Neurology

## 2016-06-14 VITALS — BP 122/84 | HR 71 | Ht 64.0 in | Wt 144.0 lb

## 2016-06-14 DIAGNOSIS — M797 Fibromyalgia: Secondary | ICD-10-CM

## 2016-06-14 NOTE — Progress Notes (Signed)
Reason for visit: Fibromyalgia  Patricia LocksMaribeth G Lloyd is an 56 y.o. female  History of present illness:  [name] is a 56 year old right-handed white female with a history of fibromyalgia. The patient is followed by Dr. Nickola MajorHawkes, and she has seen Dr. Corliss Skainseveshwar in the past for this. The patient was seen previously for some problems with right hip pain and some left hip pain with discomfort down the left leg to the ankle. The patient has some numbness in the lateral aspects of the feet and toes bilaterally. She has undergone EMG and nerve conduction study that was completely unremarkable. She eventually underwent MRI of the lumbosacral spine on 12/16/2015 which showed evidence of significant facet joint arthritis at the L4-5 level. This study was reviewed on line. The spinal canal is patent and there is no evidence of neuroforaminal stenosis at any level. No evidence of nerve root impingement was seen. The patient was placed on Mobic and this has helped her back pain significantly. The patient has been able to get back and exercise, she is undergoing Pilates. She returns for an evaluation. She still has some intermittent numbness of the left hand at times.  Past Medical History:  Diagnosis Date  . Dizziness and giddiness 12/25/2012  . Fibromyalgia   . H/O fatigue   . Insomnia   . PONV (postoperative nausea and vomiting)    severe  . Poor circulation    wears support stockings    Past Surgical History:  Procedure Laterality Date  . CESAREAN SECTION  1995  . CESAREAN SECTION  2000  . DILATATION & CURETTAGE/HYSTEROSCOPY WITH TRUECLEAR N/A 01/01/2013   Procedure: DILATATION & CURETTAGE/HYSTEROSCOPY WITH TRUCLEAR;  Surgeon: Meriel Picaichard M Holland, MD;  Location: [facility];  Service: Gynecology;  Laterality: N/A;  . SHOULDER SURGERY  05/03/2010   Left shoulder surgery, Dr.Doldorf    . TMJ ARTHROPLASTY  1990    Family History  Problem Relation Age of Onset  . Cancer Mother        Pancreatic   .  Cancer Maternal Aunt     Social history:  reports that she has quit smoking. She has never used smokeless tobacco. She reports that she drinks alcohol. She reports that she does not use drugs.   No Known Allergies  Medications:  Prior to Admission medications   Medication Sig Start Date End Date Taking? Authorizing Provider  B Complex Vitamins (B COMPLEX PO) Take by mouth.   Yes [provider]  BIOTIN FORTE PO Take by mouth.   Yes [provider]  cetirizine-pseudoephedrine (ZYRTEC-D) 5-120 MG tablet One twice daily for congestion 04/21/15  Yes Kimber RelicGreen, Arthur G, MD  ESTERIFIED ESTROGENS PO Take by mouth.   Yes [provider]  magnesium gluconate (MAGONATE) 500 MG tablet Take 500 mg by mouth 2 (two) times daily.   Yes [provider]  meloxicam (MOBIC) 15 MG tablet Take 15 mg by mouth daily. 06/14/16  Yes [provider]  Multiple Vitamins-Minerals (MULTI COMPLETE PO) Take by mouth.   Yes [provider]  naproxen sodium (ANAPROX) 220 MG tablet Take 220 mg by mouth 2 (two) times daily as needed.   Yes [provider]  OVER THE COUNTER MEDICATION Cellulegs   Yes [provider]  Progesterone Micronized (PROGESTERONE PO) Take by mouth.   Yes [provider]  ALPRAZolam (XANAX) 0.25 MG tablet Take 1 tablet (0.25 mg total) by mouth 2 (two) times daily as needed for anxiety. Patient not taking:  Reported on 06/14/2016 06/10/14   Kimber Relic, MD    ROS:  Out of a complete 14 system review of symptoms, the patient complains only of the following symptoms, and all other reviewed systems are negative.  Fatigue, weight gain Ear pain Eye redness Leg swelling Daytime sleepiness Joint pain, back pain, neck pain Bruising easily Numbness  Blood pressure 122/84, pulse 71, height 5\' 4"  (1.626 m), weight 144 lb (65.3 kg).  Physical Exam  General: The patient is alert and cooperative at the time of the  examination.  Neuromuscular: Range of movement of the lumbosacral spine is full.  Skin: No significant peripheral edema is noted.   Neurologic Exam  Mental status: The patient is alert and oriented x 3 at the time of the examination. The patient has apparent normal recent and remote memory, with an apparently normal attention span and concentration ability.   Cranial nerves: Facial symmetry is present. Speech is normal, no aphasia or dysarthria is noted. Extraocular movements are full. Visual fields are full.  Motor: The patient has good strength in all 4 extremities.  Sensory examination: Soft touch sensation is symmetric on the face, arms, and legs.  Coordination: The patient has good finger-nose-finger and heel-to-shin bilaterally.  Gait and station: The patient has a normal gait. Tandem gait is normal. Romberg is negative. No drift is seen.  Reflexes: Deep tendon reflexes are symmetric.   Assessment/Plan:  1. Fibromyalgia  2. Low back pain  The patient does have facet joint arthritis at L4-5 level, she has responded well to Mobic therapy. There is no evidence of nerve root impingement. At this point, the patient will follow-up through this office on an as-needed basis. If the back pain worsens, facet joint injections may be considered.   Marlan Palau MD 06/14/2016 9:18 AM  [facility] [address] [place], [place] [address]  Phone [phone] Fax [phone]

## 2016-06-20 DIAGNOSIS — F4323 Adjustment disorder with mixed anxiety and depressed mood: Secondary | ICD-10-CM | POA: Diagnosis not present

## 2016-07-05 DIAGNOSIS — F4323 Adjustment disorder with mixed anxiety and depressed mood: Secondary | ICD-10-CM | POA: Diagnosis not present

## 2016-07-18 DIAGNOSIS — N390 Urinary tract infection, site not specified: Secondary | ICD-10-CM | POA: Diagnosis not present

## 2016-07-26 DIAGNOSIS — N390 Urinary tract infection, site not specified: Secondary | ICD-10-CM | POA: Diagnosis not present

## 2016-07-26 DIAGNOSIS — R319 Hematuria, unspecified: Secondary | ICD-10-CM | POA: Diagnosis not present

## 2016-08-09 DIAGNOSIS — N8182 Incompetence or weakening of pubocervical tissue: Secondary | ICD-10-CM | POA: Diagnosis not present

## 2016-08-09 DIAGNOSIS — N393 Stress incontinence (female) (male): Secondary | ICD-10-CM | POA: Diagnosis not present

## 2016-08-15 DIAGNOSIS — F4323 Adjustment disorder with mixed anxiety and depressed mood: Secondary | ICD-10-CM | POA: Diagnosis not present

## 2016-08-24 DIAGNOSIS — F4323 Adjustment disorder with mixed anxiety and depressed mood: Secondary | ICD-10-CM | POA: Diagnosis not present

## 2016-10-16 DIAGNOSIS — F4323 Adjustment disorder with mixed anxiety and depressed mood: Secondary | ICD-10-CM | POA: Diagnosis not present

## 2016-10-18 DIAGNOSIS — Z23 Encounter for immunization: Secondary | ICD-10-CM | POA: Diagnosis not present

## 2016-10-25 DIAGNOSIS — F4323 Adjustment disorder with mixed anxiety and depressed mood: Secondary | ICD-10-CM | POA: Diagnosis not present

## 2016-11-07 ENCOUNTER — Ambulatory Visit (INDEPENDENT_AMBULATORY_CARE_PROVIDER_SITE_OTHER): Payer: BLUE CROSS/BLUE SHIELD | Admitting: Sports Medicine

## 2016-11-07 VITALS — BP 120/84 | Ht 64.5 in | Wt 143.0 lb

## 2016-11-07 DIAGNOSIS — G8929 Other chronic pain: Secondary | ICD-10-CM | POA: Diagnosis not present

## 2016-11-07 DIAGNOSIS — M25511 Pain in right shoulder: Secondary | ICD-10-CM

## 2016-11-07 DIAGNOSIS — M79604 Pain in right leg: Secondary | ICD-10-CM

## 2016-11-07 MED ORDER — AMITRIPTYLINE HCL 25 MG PO TABS: 25.0000 mg | ORAL_TABLET | Freq: Every day | ORAL | 1 refills | Status: DC

## 2016-11-07 NOTE — Progress Notes (Signed)
Chief complaint: Follow-up of left shoulder pain and right lower leg pain 2 years  History of present illness: Deshonda is 56 year old female who presents to the sports medicine office today with chief complaint of continued left shoulder pain in right lower leg pain.  This is been a chronic problem for her for at least 2 years now.  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  Additionally, she does have known history of anterior tibialis muscle strain. Last visit here in the office was approximately 2 years ago.  She reports that she is still having pain in her left shoulder, reports pain specifically in the anterior aspect of her left shoulder near the glenohumeral joint as well as laterally over the deltoid muscle.  She reports any specific activities involving overhead motion, shoulder extension, shoulder abduction, and external range of motion are aggravating factors. She is right-hand dominant. She does not report of any interval injury or trauma. She does not report of any numbness, tingling, or burning paresthesias in her upper extremities.today, she rates the pain as a 6/10.  She reports over the last few months symptoms have progressively worsened.  She does report pain waking her up at nighttime.  In addition, she reports to having right lower leg pain in the region of the anterior tibialis muscle group.  She reports feeling of a tightness sensation.  She reports pain is most bothersome at nighttime.  She reports of a pulsating sensation, also reports of a tightness sensation.  She does not report of any numbness or tingling but reports over the last few weeks she has started to get some of the symptoms specifically affecting the first and second toe. She does not report of any weakness.  She does not report of any interval injury or trauma.  She has been using a body helix sleeve.  Additionally, she reports that she is using a B  multivitamin daily. Similarly to the shoulder she describes the pain as a 6/10 today.  She does not report of anydiffuse lower extremity swelling,does not report of any calf erythema Warmth.  No personal family history of DVT, no  Recent surgeries, prolonged immobilization, or long distance traveling.  Review of systems:  As stated above  Interval past medical history, surgical history, family history, and social history obtained and unchanged. She does have history of fibromyalgia, no history of diabetes.  She has had bilateral shoulder arthroscopy, nno surgery involving her lower extremities, she does not report of any current tobacco use, she does not reported family history of type 2 diabetes  Physical exam: Vital signs are reviewed and are documented in the chart Gen.: Alert, oriented, appears stated age, in no apparent distress HEENT: Moist oral mucosa Respiratory: Normal respirations, able to speak in full sentences Cardiac: Regular rate, distal pulses 2+ Integumentary: No rashes on visible skin:  Neurologic: She does have slight weakness with rotator cuff strength testing, Specifically with the supraspinatus and infraspinatus muscle groups, otherwise strength 5/5, sensation 2+ In bilateral upper extremities and lower extremities Psych: Normal affect, mood is described as good Musculoskeletal: Inspection of her left shoulder reveals no obvious deformity or muscle atrophy, no warmth, erythema, ecchymosis, or effusion, she is slightly tender to deep palpation over the anterior shoulder at the glenohumeral joint, no tenderness over the clavicle, AC joint, acromion, scapular spine, trapezius, deltoid muscles, no tenderness over the cervical spine or paracervical spinous processes, she does have limitations in shoulder range of  motion with abduction and forward flexion, only able to go to 40 deg, external rotation decreased to 30 deg, internal range of motion to 45 deg, rotator cuff impingement  testing positive with Neer's, Hawkins, and cross arm, Speed, Yergason,and Spurling negative, O'Brien positive; inspection of her right lower leg reveals no obvious deformity or muscle atrophy, no warmth, erythema, ecchymosis, or effusion, she is tender to palpation over the lateral aspect of the midportion of the right anterior tibialis muscle, no tenderness over the lateral aspect of the tibia, no tenderness to palpation over the proximal tibia or fibular head no no tenderness to palpation over the posterior aspect of the calf muscles, strength and sensation intact in bilateral lower extremities  Assessment and plan: 1. 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 Discussed patient with the option of cortisone injectioninto the left shoulder, but she declines on this today -Discussed in the future, barbotage could be an option -Discussed cryotherapy  Right lower leg pain -Given no mechanism of injury or any type of specific incidence in, have low suspicion that this is secondary to muscle strain or tear -Given the nerve symptoms that she is having, question whether this is some sort of chronic exertional compartment syndrome -Will start patient on amitriptyline 25 mg nightly, discussed side effects of drowsiness and dry mouth, Also discussed that this can help out with her left shoulder pain -Discussed to ensure she is taking vitamin B6 100 mg daily  Will have patient follow-up in 4 weeks or sooner as needed.    Haynes Kernshristopher Lake, M.D. Primary Care Sports Medicine Fellow [facility]

## 2016-11-07 NOTE — Patient Instructions (Signed)
Take Vitamin B6 100mg  daily. Ice 5 mins on left shoulder every couple of hours. Follow up in 4 weeks.

## 2016-11-23 DIAGNOSIS — F4323 Adjustment disorder with mixed anxiety and depressed mood: Secondary | ICD-10-CM | POA: Diagnosis not present

## 2016-12-12 ENCOUNTER — Ambulatory Visit: Payer: BLUE CROSS/BLUE SHIELD | Admitting: Sports Medicine

## 2016-12-12 ENCOUNTER — Encounter: Payer: Self-pay | Admitting: Sports Medicine

## 2016-12-12 VITALS — BP 120/70 | Ht 64.5 in | Wt 138.0 lb

## 2016-12-12 DIAGNOSIS — M25512 Pain in left shoulder: Secondary | ICD-10-CM | POA: Diagnosis not present

## 2016-12-12 DIAGNOSIS — M79661 Pain in right lower leg: Secondary | ICD-10-CM

## 2016-12-12 DIAGNOSIS — G8929 Other chronic pain: Secondary | ICD-10-CM

## 2016-12-12 NOTE — Progress Notes (Signed)
Chief complaint: Follow-up of chronic left shoulder pain and right lower pain  History of present illness: Patricia Lloyd is 56 year old female who presents to the sports medicine office today for follow-up of left shoulder pain and right lower leg pain. Last office visit here was about 5 weeks ago back on 11/07/16. At that time, she reported her shoulder pain was in the anterior portion near the glenohumeral joint as well as laterally over the deltoid muscle. She reports that symptoms have improved over the last 5 weeks. She reports that she has been doing Pilates, most recently as of last night. She reports still having some pain in the anterior shoulder, specifically with movement such as abduction and external rotation. She is also concerned today about an area over the lateral aspect of the left deltoid that she thinks could be a small cyst. She reports that it is tender to palpation. She reports a certain times of the day is more noticeable. She does not report of any numbness, tingling, or burning paresthesias. She does not report of any obvious weakness in her left arm.  Fortunately, she reports today that her right lower leg pain has markedly improved. She is currently on amitriptyline 25 mg nightly as well as vitamin B6 100 mg daily. She reports that she is not having the pain that she once had. She reports on occasion she'll notice a small twinge in the area of the anterior tibialis muscle group. She does not report feeling a tightness that was there a few weeks ago. She does continue to use the body Helix sleeve  Review of systems:  As stated above  Interval past medical history, surgical history, family history, and social history obtained and unchanged.  Physical exam: Vital signs are reviewed and are documented in the chart Gen.: Alert, oriented, appears stated age, in no apparent distress HEENT: Moist oral mucosa Respiratory: Normal respirations, able to speak in full sentences Cardiac:  Regular rate, distal pulses 2+ Integumentary: No rashes on visible skin:  Neurologic: Strength 5/5, sensation 2+ Psych: Normal affect, mood is described as good Musculoskeletal: Inspection of her left shoulder reveals no obvious deformity or muscle atrophy, no warmth, erythema, ecchymosis, or effusion, she is still slightly tender to deep palpation over the anterior shoulder at the glenohumeral joint, no tenderness over the clavicle, AC joint, acromion, scapular spine, trapezius, no tenderness over the cervical spine or paracervical spinous processes, she does have some tenderness over the lateral deltoid midway down where I can palpate a small, nonfluctuant nodule; her ROM has improved today, is able to go to 160 degrees of forward flexion, external rotation to 45 deg, internal rotation to 60 deg, rotator cuff impingement testing is still positive today with Neer's, Hawkins, and cross arm, Speed, Yergason,and Spurling negative, O'Brien positive; inspection of her right lower leg reveals no obvious deformity or muscle atrophy, no warmth, erythema, ecchymosis, or effusion, no tenderness to palpation over the lateral aspect of the midportion of the right anterior tibialis muscle, no tenderness over the lateral aspect of the tibia, no tenderness to palpation over the proximal tibia or fibular head no no tenderness to palpation over the posterior aspect of the calf muscles, strength and sensation intact in bilateral lower extremities  Limited musculoskeletal ultrasound was performed of her left deltoid muscle today, she does have a very small, superficial fibroma on the lateral aspect of her left deltoid.  Assessment and plan: 1. 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  Left deltoid small fibroma 3. Chronic right lower leg pain, question chronic exertional compartment syndrome, symptoms improving  Plan: In regards to the left shoulder pain, her  symptoms have remarkably improved. Discussed to continue with Pilates and range of motion exercises. Reassured her that the nodule in left deltoid is from a small fibroma. Discussed recommendation to leave it alone, as she risks scar formation if it is excised. She does agree with this. In regards to the chronic right lower leg pain, it seems the amitriptyline has helped out with her symptoms. Question whether she does have neuropathic source of pain, including from chronic exertional compartment syndrome. We'll have her continue with amitriptyline 25 mg nightly as well as vitamin B6 100 mg daily. Discussed potentially leaving her on this for 12 months total. We will have her follow-up in 3 months or sooner as needed.   Haynes Kernshristopher Jaliah Foody, M.D. Primary Care Sports Medicine Fellow [facility]

## 2016-12-13 DIAGNOSIS — F4323 Adjustment disorder with mixed anxiety and depressed mood: Secondary | ICD-10-CM | POA: Diagnosis not present

## 2016-12-22 DIAGNOSIS — F4323 Adjustment disorder with mixed anxiety and depressed mood: Secondary | ICD-10-CM | POA: Diagnosis not present

## 2017-01-03 DIAGNOSIS — Z1231 Encounter for screening mammogram for malignant neoplasm of breast: Secondary | ICD-10-CM | POA: Diagnosis not present

## 2017-01-03 DIAGNOSIS — Z803 Family history of malignant neoplasm of breast: Secondary | ICD-10-CM | POA: Diagnosis not present

## 2017-01-24 DIAGNOSIS — F4323 Adjustment disorder with mixed anxiety and depressed mood: Secondary | ICD-10-CM | POA: Diagnosis not present

## 2017-01-24 DIAGNOSIS — Z800 Family history of malignant neoplasm of digestive organs: Secondary | ICD-10-CM | POA: Diagnosis not present

## 2017-01-24 DIAGNOSIS — Z808 Family history of malignant neoplasm of other organs or systems: Secondary | ICD-10-CM | POA: Diagnosis not present

## 2017-01-24 DIAGNOSIS — Z803 Family history of malignant neoplasm of breast: Secondary | ICD-10-CM | POA: Diagnosis not present

## 2017-01-24 DIAGNOSIS — Z801 Family history of malignant neoplasm of trachea, bronchus and lung: Secondary | ICD-10-CM | POA: Diagnosis not present

## 2017-01-24 DIAGNOSIS — Z01419 Encounter for gynecological examination (general) (routine) without abnormal findings: Secondary | ICD-10-CM | POA: Diagnosis not present

## 2017-01-24 DIAGNOSIS — Z6825 Body mass index (BMI) 25.0-25.9, adult: Secondary | ICD-10-CM | POA: Diagnosis not present

## 2017-02-05 DIAGNOSIS — M7661 Achilles tendinitis, right leg: Secondary | ICD-10-CM | POA: Diagnosis not present

## 2017-02-05 DIAGNOSIS — G5761 Lesion of plantar nerve, right lower limb: Secondary | ICD-10-CM | POA: Diagnosis not present

## 2017-02-05 DIAGNOSIS — M2041 Other hammer toe(s) (acquired), right foot: Secondary | ICD-10-CM | POA: Diagnosis not present

## 2017-02-05 DIAGNOSIS — M21611 Bunion of right foot: Secondary | ICD-10-CM | POA: Diagnosis not present

## 2017-02-05 DIAGNOSIS — R238 Other skin changes: Secondary | ICD-10-CM | POA: Diagnosis not present

## 2017-02-06 DIAGNOSIS — F4323 Adjustment disorder with mixed anxiety and depressed mood: Secondary | ICD-10-CM | POA: Diagnosis not present

## 2017-02-22 DIAGNOSIS — F4323 Adjustment disorder with mixed anxiety and depressed mood: Secondary | ICD-10-CM | POA: Diagnosis not present

## 2017-03-12 DIAGNOSIS — F4323 Adjustment disorder with mixed anxiety and depressed mood: Secondary | ICD-10-CM | POA: Diagnosis not present

## 2017-03-21 DIAGNOSIS — Z809 Family history of malignant neoplasm, unspecified: Secondary | ICD-10-CM | POA: Diagnosis not present

## 2017-03-22 DIAGNOSIS — F4323 Adjustment disorder with mixed anxiety and depressed mood: Secondary | ICD-10-CM | POA: Diagnosis not present

## 2017-03-26 ENCOUNTER — Other Ambulatory Visit: Payer: Self-pay | Admitting: Obstetrics and Gynecology

## 2017-03-26 DIAGNOSIS — Z803 Family history of malignant neoplasm of breast: Secondary | ICD-10-CM

## 2017-03-29 DIAGNOSIS — F4323 Adjustment disorder with mixed anxiety and depressed mood: Secondary | ICD-10-CM | POA: Diagnosis not present

## 2017-04-18 ENCOUNTER — Other Ambulatory Visit: Payer: BLUE CROSS/BLUE SHIELD

## 2017-04-18 DIAGNOSIS — F4323 Adjustment disorder with mixed anxiety and depressed mood: Secondary | ICD-10-CM | POA: Diagnosis not present

## 2017-04-23 DIAGNOSIS — M79672 Pain in left foot: Secondary | ICD-10-CM | POA: Diagnosis not present

## 2017-04-23 DIAGNOSIS — M2011 Hallux valgus (acquired), right foot: Secondary | ICD-10-CM | POA: Diagnosis not present

## 2017-04-23 DIAGNOSIS — M79671 Pain in right foot: Secondary | ICD-10-CM | POA: Diagnosis not present

## 2017-04-26 DIAGNOSIS — F4323 Adjustment disorder with mixed anxiety and depressed mood: Secondary | ICD-10-CM | POA: Diagnosis not present

## 2017-05-01 DIAGNOSIS — F4323 Adjustment disorder with mixed anxiety and depressed mood: Secondary | ICD-10-CM | POA: Diagnosis not present

## 2017-05-02 DIAGNOSIS — M79661 Pain in right lower leg: Secondary | ICD-10-CM | POA: Diagnosis not present

## 2017-05-02 DIAGNOSIS — Z01818 Encounter for other preprocedural examination: Secondary | ICD-10-CM | POA: Diagnosis not present

## 2017-05-02 DIAGNOSIS — F5104 Psychophysiologic insomnia: Secondary | ICD-10-CM | POA: Diagnosis not present

## 2017-05-02 DIAGNOSIS — F419 Anxiety disorder, unspecified: Secondary | ICD-10-CM | POA: Diagnosis not present

## 2017-05-07 DIAGNOSIS — M2041 Other hammer toe(s) (acquired), right foot: Secondary | ICD-10-CM | POA: Diagnosis not present

## 2017-05-07 DIAGNOSIS — M79671 Pain in right foot: Secondary | ICD-10-CM | POA: Diagnosis not present

## 2017-05-07 DIAGNOSIS — M79672 Pain in left foot: Secondary | ICD-10-CM | POA: Diagnosis not present

## 2017-05-07 DIAGNOSIS — M21612 Bunion of left foot: Secondary | ICD-10-CM | POA: Diagnosis not present

## 2017-05-11 DIAGNOSIS — H1045 Other chronic allergic conjunctivitis: Secondary | ICD-10-CM | POA: Diagnosis not present

## 2017-06-05 DIAGNOSIS — M1990 Unspecified osteoarthritis, unspecified site: Secondary | ICD-10-CM | POA: Diagnosis not present

## 2017-06-05 DIAGNOSIS — M2041 Other hammer toe(s) (acquired), right foot: Secondary | ICD-10-CM | POA: Diagnosis not present

## 2017-06-05 DIAGNOSIS — M2011 Hallux valgus (acquired), right foot: Secondary | ICD-10-CM | POA: Diagnosis not present

## 2017-06-08 DIAGNOSIS — M2011 Hallux valgus (acquired), right foot: Secondary | ICD-10-CM | POA: Diagnosis not present

## 2017-07-02 DIAGNOSIS — F4323 Adjustment disorder with mixed anxiety and depressed mood: Secondary | ICD-10-CM | POA: Diagnosis not present

## 2017-07-13 DIAGNOSIS — M2011 Hallux valgus (acquired), right foot: Secondary | ICD-10-CM | POA: Diagnosis not present

## 2017-07-16 DIAGNOSIS — F4323 Adjustment disorder with mixed anxiety and depressed mood: Secondary | ICD-10-CM | POA: Diagnosis not present

## 2017-09-05 DIAGNOSIS — Z85828 Personal history of other malignant neoplasm of skin: Secondary | ICD-10-CM | POA: Diagnosis not present

## 2017-09-05 DIAGNOSIS — L821 Other seborrheic keratosis: Secondary | ICD-10-CM | POA: Diagnosis not present

## 2017-09-24 DIAGNOSIS — M2011 Hallux valgus (acquired), right foot: Secondary | ICD-10-CM | POA: Diagnosis not present

## 2017-10-10 DIAGNOSIS — F4323 Adjustment disorder with mixed anxiety and depressed mood: Secondary | ICD-10-CM | POA: Diagnosis not present

## 2017-10-10 DIAGNOSIS — R0789 Other chest pain: Secondary | ICD-10-CM | POA: Diagnosis not present

## 2017-10-10 DIAGNOSIS — Z23 Encounter for immunization: Secondary | ICD-10-CM | POA: Diagnosis not present

## 2017-10-10 DIAGNOSIS — M1990 Unspecified osteoarthritis, unspecified site: Secondary | ICD-10-CM | POA: Diagnosis not present

## 2017-10-24 DIAGNOSIS — F4323 Adjustment disorder with mixed anxiety and depressed mood: Secondary | ICD-10-CM | POA: Diagnosis not present

## 2017-10-29 DIAGNOSIS — F4323 Adjustment disorder with mixed anxiety and depressed mood: Secondary | ICD-10-CM | POA: Diagnosis not present

## 2017-11-13 DIAGNOSIS — M2550 Pain in unspecified joint: Secondary | ICD-10-CM | POA: Diagnosis not present

## 2017-11-21 DIAGNOSIS — F4323 Adjustment disorder with mixed anxiety and depressed mood: Secondary | ICD-10-CM | POA: Diagnosis not present

## 2017-11-26 DIAGNOSIS — F4323 Adjustment disorder with mixed anxiety and depressed mood: Secondary | ICD-10-CM | POA: Diagnosis not present

## 2017-12-13 DIAGNOSIS — F4323 Adjustment disorder with mixed anxiety and depressed mood: Secondary | ICD-10-CM | POA: Diagnosis not present

## 2017-12-21 DIAGNOSIS — M722 Plantar fascial fibromatosis: Secondary | ICD-10-CM | POA: Diagnosis not present

## 2017-12-21 DIAGNOSIS — M2011 Hallux valgus (acquired), right foot: Secondary | ICD-10-CM | POA: Diagnosis not present

## 2017-12-24 DIAGNOSIS — F4323 Adjustment disorder with mixed anxiety and depressed mood: Secondary | ICD-10-CM | POA: Diagnosis not present

## 2018-01-08 DIAGNOSIS — Z1231 Encounter for screening mammogram for malignant neoplasm of breast: Secondary | ICD-10-CM | POA: Diagnosis not present

## 2018-01-08 DIAGNOSIS — Z803 Family history of malignant neoplasm of breast: Secondary | ICD-10-CM | POA: Diagnosis not present

## 2018-01-10 DIAGNOSIS — F4323 Adjustment disorder with mixed anxiety and depressed mood: Secondary | ICD-10-CM | POA: Diagnosis not present

## 2018-01-17 DIAGNOSIS — F4323 Adjustment disorder with mixed anxiety and depressed mood: Secondary | ICD-10-CM | POA: Diagnosis not present

## 2018-02-06 DIAGNOSIS — M150 Primary generalized (osteo)arthritis: Secondary | ICD-10-CM | POA: Diagnosis not present

## 2018-02-06 DIAGNOSIS — M5410 Radiculopathy, site unspecified: Secondary | ICD-10-CM | POA: Diagnosis not present

## 2018-02-06 DIAGNOSIS — M545 Low back pain: Secondary | ICD-10-CM | POA: Diagnosis not present

## 2018-02-06 DIAGNOSIS — M2550 Pain in unspecified joint: Secondary | ICD-10-CM | POA: Diagnosis not present

## 2018-03-19 DIAGNOSIS — Z13820 Encounter for screening for osteoporosis: Secondary | ICD-10-CM | POA: Diagnosis not present

## 2018-03-19 DIAGNOSIS — Z6824 Body mass index (BMI) 24.0-24.9, adult: Secondary | ICD-10-CM | POA: Diagnosis not present

## 2018-03-19 DIAGNOSIS — Z01419 Encounter for gynecological examination (general) (routine) without abnormal findings: Secondary | ICD-10-CM | POA: Diagnosis not present

## 2018-04-30 DIAGNOSIS — R509 Fever, unspecified: Secondary | ICD-10-CM | POA: Diagnosis not present

## 2018-04-30 DIAGNOSIS — R05 Cough: Secondary | ICD-10-CM | POA: Diagnosis not present

## 2018-05-02 DIAGNOSIS — F4323 Adjustment disorder with mixed anxiety and depressed mood: Secondary | ICD-10-CM | POA: Diagnosis not present

## 2018-06-10 DIAGNOSIS — F4323 Adjustment disorder with mixed anxiety and depressed mood: Secondary | ICD-10-CM | POA: Diagnosis not present

## 2018-06-18 DIAGNOSIS — F4323 Adjustment disorder with mixed anxiety and depressed mood: Secondary | ICD-10-CM | POA: Diagnosis not present

## 2018-06-26 DIAGNOSIS — F4323 Adjustment disorder with mixed anxiety and depressed mood: Secondary | ICD-10-CM | POA: Diagnosis not present

## 2018-07-02 ENCOUNTER — Encounter: Payer: Self-pay | Admitting: Family Medicine

## 2018-07-02 ENCOUNTER — Ambulatory Visit: Payer: Self-pay

## 2018-07-02 ENCOUNTER — Ambulatory Visit (INDEPENDENT_AMBULATORY_CARE_PROVIDER_SITE_OTHER): Payer: BC Managed Care – PPO | Admitting: Family Medicine

## 2018-07-02 ENCOUNTER — Other Ambulatory Visit: Payer: Self-pay

## 2018-07-02 VITALS — BP 102/70 | HR 74 | Ht 64.5 in | Wt 145.0 lb

## 2018-07-02 DIAGNOSIS — M79671 Pain in right foot: Secondary | ICD-10-CM

## 2018-07-02 DIAGNOSIS — M216X1 Other acquired deformities of right foot: Secondary | ICD-10-CM

## 2018-07-02 DIAGNOSIS — M7671 Peroneal tendinitis, right leg: Secondary | ICD-10-CM

## 2018-07-02 DIAGNOSIS — G5761 Lesion of plantar nerve, right lower limb: Secondary | ICD-10-CM | POA: Diagnosis not present

## 2018-07-02 NOTE — Assessment & Plan Note (Signed)
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  We made adjustments to metatarsal pad in her orthotics, and discussed home exercises, icing regimen, discussed and patient is having a reactive peroneal tendinitis is also likely contributing.  Topical anti-inflammatories given.  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  Patient is on the amitriptyline with no significant benefit and so we will can try to treat this mechanically before any injections.  Follow-up again in 4 to 6 weeks

## 2018-07-02 NOTE — Assessment & Plan Note (Signed)
Hopefully when we lift patient's foot we can avoid the mechanical irritation.  Worsening symptoms consider injection

## 2018-07-02 NOTE — Patient Instructions (Signed)
Exercises 3x a week Body helix X fit ankle [email or web address] Pennsaid 2x daily Do no lace last eye of shoes See me again4 weeks

## 2018-07-02 NOTE — Progress Notes (Signed)
[facility] [place],  [address] Phone: [phone] Subjective:   Patricia Lloyd, am serving as a scribe for Dr. Hulan Saas.  I'm seeing this patient by the request  of:    CC: Right foot pain  [id number]  Patricia Lloyd is a 58 y.o. female coming in with complaint of right foot pain. Patient had bunionectomy one year ago as well as spur removal by a renowned Psychologist, sport and exercise in [place]. 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 Patient wears a stiff flat soled shoe that is most relieving of her pain. Pain between the 2nd and 3rd met as well as pain in the arch. Did have an injection late last year. Only able to walk one mile before she has pain. Limited mobility. Does use Mobic. Uses it biweekly and switches to Tylenol for the opposing week.    Past Medical History:  Diagnosis Date   Dizziness and giddiness 12/25/2012   Fibromyalgia    H/O fatigue    Insomnia    PONV (postoperative nausea and vomiting)    severe   Poor circulation    wears support stockings   Past Surgical History:  Procedure Laterality Date   CESAREAN SECTION  1995   CESAREAN SECTION  2000   DILATATION & CURETTAGE/HYSTEROSCOPY WITH TRUECLEAR N/A 01/01/2013   Procedure: DILATATION & CURETTAGE/HYSTEROSCOPY WITH TRUCLEAR;  Surgeon: Margarette Asal, MD;  Location: [facility];  Service: Gynecology;  Laterality: N/A;   SHOULDER SURGERY  05/03/2010   Left shoulder surgery, Dr.Doldorf     TMJ ARTHROPLASTY  1990   Social History   Socioeconomic History   Marital status: Single    Spouse name: Not on file   Number of children: 2   Years of education: COLLEGE   Highest education level: Not on file  Occupational History   Occupation: Surveyor, minerals: [employer]   Financial resource strain: Not on file   Food insecurity    Worry: Not on file    Inability: Not on file   Transportation needs    Medical: Not on  file    Non-medical: Not on file  Tobacco Use   Smoking status: Former Smoker   Smokeless tobacco: Never Used   Tobacco comment: Social smoker quit at age 55  Substance and Sexual Activity   Alcohol use: Yes    Alcohol/week: 0.0 standard drinks    Comment: 2 GLASSES WINE DAILY    Drug use: Lloyd   Sexual activity: Not on file  Lifestyle   Physical activity    Days per week: Not on file    Minutes per session: Not on file   Stress: Not on file  Relationships   Social connections    Talks on phone: Not on file    Gets together: Not on file    Attends religious service: Not on file    Active member of club or organization: Not on file    Attends meetings of clubs or organizations: Not on file    Relationship status: Not on file  Other Topics Concern   Not on file  Social History Narrative   Lives at home w/ her fiance and daughter   Right-handed   Caffeine: 1 cup of coffe per day   Lloyd Known Allergies Family History  Problem Relation Age of Onset   Cancer Mother  Pancreatic    Cancer Maternal Aunt     Current Outpatient Medications (Endocrine & Metabolic):    ESTERIFIED ESTROGENS PO, Take by mouth.   Progesterone Micronized (PROGESTERONE PO), Take by mouth.   Current Outpatient Medications (Respiratory):    cetirizine-pseudoephedrine (ZYRTEC-D) 5-120 MG tablet, One twice daily for congestion  Current Outpatient Medications (Analgesics):    meloxicam (MOBIC) 15 MG tablet, Take 15 mg by mouth daily.   naproxen sodium (ANAPROX) 220 MG tablet, Take 220 mg by mouth 2 (two) times daily as needed.   Current Outpatient Medications (Other):    ALPRAZolam (XANAX) 0.25 MG tablet, Take 1 tablet (0.25 mg total) by mouth 2 (two) times daily as needed for anxiety.   amitriptyline (ELAVIL) 25 MG tablet, Take 1 tablet (25 mg total) by mouth at bedtime.   B Complex Vitamins (B COMPLEX PO), Take by mouth.   BIOTIN FORTE PO, Take by mouth.   magnesium  gluconate (MAGONATE) 500 MG tablet, Take 500 mg by mouth 2 (two) times daily.   Multiple Vitamins-Minerals (MULTI COMPLETE PO), Take by mouth.   OVER THE COUNTER MEDICATION, Cellulegs    Past medical history, social, surgical and family history all reviewed in electronic medical record.  Lloyd pertanent information unless stated regarding to the chief complaint.   Review of Systems:  Lloyd headache, visual changes, nausea, vomiting, diarrhea, constipation, dizziness, abdominal pain, skin rash, fevers, chills, night sweats, weight loss, swollen lymph nodes, body aches, joint swelling,, chest pain, shortness of breath, mood changes.  Positive muscle aches  Objective  Blood pressure 102/70, pulse 74, height 5' 4.5" (1.638 m), weight 145 lb (65.8 kg), SpO2 98 %.    General: Lloyd apparent distress alert and oriented x3 mood and affect normal, dressed appropriately.  HEENT: Pupils equal, extraocular movements intact  Respiratory: Patient's speak in full sentences and does not appear short of breath  Cardiovascular: Lloyd lower extremity edema, non tender, Lloyd erythema  Skin: Warm dry intact with Lloyd signs of infection or rash on extremities or on axial skeleton.  Abdomen: Soft nontender  Neuro: Cranial nerves II through XII are intact, neurovascularly intact in all extremities with 2+ DTRs and 2+ pulses.  Lymph: Lloyd lymphadenopathy of posterior or anterior cervical chain or axillae bilaterally.  Gait mild antalgic MSK:  Non tender with full range of motion and good stability and symmetric strength and tone of shoulders, elbows, wrist, hip, knee and ankles bilaterally.  Right foot exam shows the patient does have postoperative changes of the first MTP.  He does have some mild hallux limitus but does still have good range of motion.  Patient does have fusion of the second toe.  Patient does have severe breakdown of the transverse arch.  Positive squeeze test.  Tender between the third and fourth metatarsal  heads.  Full range of motion of the ankle.  Limited musculoskeletal ultrasound was performed and interpreted by Lyndal Pulley  Ultrasound of patient's foot does not show any type of cortical defect.  Difficult to assess patient's postsurgical changes of the first toe.  Patient noted does have a neuroma between the third and fourth metatarsal heads noted mild hypoechoic changes surrounding the area.  Lloyd cortical defect of any of the bones.  [id number]; 15 additional minutes spent for Therapeutic exercises as stated in above notes.  This included exercises focusing on stretching, strengthening, with significant focus on eccentric aspects.   Long term goals include an improvement in range of motion, strength, endurance as well  as avoiding reinjury. Patient's frequency would include in 1-2 times a day, 3-5 times a week for a duration of 6-12 weeks.  Exercises for the foot include:  Stretches to help lengthen the lower leg and plantar fascia areas Theraband exercises for the lower leg and ankle to help strengthen the surrounding area- dorsiflexion, plantarflexion, inversion, eversion Massage rolling on the plantar surface of the foot with a frozen bottle, tennis ball or golf ball Towel or marble pick-ups to strengthen the plantar surface of the foot Weight bearing exercises to increase balance and overall stability  Proper technique shown and discussed handout in great detail with ATC.  All questions were discussed and answered.     Impression and Recommendations:     This case required medical decision making of moderate complexity. The above documentation has been reviewed and is accurate and complete Lyndal Pulley, DO       Note: This dictation was prepared with Dragon dictation along with smaller phrase technology. Any transcriptional errors that result from this process are unintentional.

## 2018-07-11 DIAGNOSIS — F4323 Adjustment disorder with mixed anxiety and depressed mood: Secondary | ICD-10-CM | POA: Diagnosis not present

## 2018-07-18 DIAGNOSIS — F4323 Adjustment disorder with mixed anxiety and depressed mood: Secondary | ICD-10-CM | POA: Diagnosis not present

## 2018-08-01 ENCOUNTER — Other Ambulatory Visit: Payer: Self-pay

## 2018-08-01 ENCOUNTER — Ambulatory Visit: Payer: BC Managed Care – PPO | Admitting: Family Medicine

## 2018-08-01 ENCOUNTER — Encounter: Payer: Self-pay | Admitting: Family Medicine

## 2018-08-01 VITALS — BP 120/80 | HR 73 | Ht 64.5 in | Wt 146.0 lb

## 2018-08-01 DIAGNOSIS — M7671 Peroneal tendinitis, right leg: Secondary | ICD-10-CM | POA: Diagnosis not present

## 2018-08-01 DIAGNOSIS — M216X1 Other acquired deformities of right foot: Secondary | ICD-10-CM

## 2018-08-01 DIAGNOSIS — G5761 Lesion of plantar nerve, right lower limb: Secondary | ICD-10-CM

## 2018-08-01 NOTE — Assessment & Plan Note (Signed)
Patient has had surgical intervention already on the toe.  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  Patient wanted to avoid any type of steroid injection today.  We will set up for custom orthotics.  Encourage patient continue to wear good shoes.  Continue the compression on the ankle as possible.  Otherwise we will consider injection of the neuroma, peroneal tendon, or the peroneal gastroc impingement.  Follow-up again in 4 weeks

## 2018-08-01 NOTE — Progress Notes (Signed)
[facility] [place], [place] [address] Phone: [phone] Subjective:     Patricia Lloyd, LAT, ATC, am serving as scribe for Dr. Hulan Saas.  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  We made adjustments to metatarsal pad in her orthotics, and discussed home exercises, icing regimen, discussed and patient is having a reactive peroneal tendinitis is also likely contributing.  Topical anti-inflammatories given.  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  Patient is on the amitriptyline with no significant benefit and so we will can try to treat this mechanically before any injections.  Follow-up again in 4 to 6 weeks   08/01/18: Patricia Lloyd is a 58 y.o. female coming in with complaint of R foot pain.  Pt was last seen on 07/02/18.  She reports slight improvement in her symptoms..  She states that it's better but not great.  Pt states that she did purchase the Body Helix brace and states that her R ankle feels worse w/ the brace.  She's not sure if the change in her orthotics has helped.  She states that she continues to alternate Mobic and Tylenol.  Pt states that the Pennsaid did not help and is not using.  She has been doing her HEP.      Past Medical History:  Diagnosis Date  . Dizziness and giddiness 12/25/2012  . Fibromyalgia   . H/O fatigue   . Insomnia   . PONV (postoperative nausea and vomiting)    severe  . Poor circulation    wears support stockings   Past Surgical History:  Procedure Laterality Date  . CESAREAN SECTION  1995  . CESAREAN SECTION  2000  . DILATATION & CURETTAGE/HYSTEROSCOPY WITH TRUECLEAR N/A 01/01/2013   Procedure: DILATATION & CURETTAGE/HYSTEROSCOPY WITH TRUCLEAR;  Surgeon: Margarette Asal, MD;  Location: [facility];  Service: Gynecology;  Laterality: N/A;  . SHOULDER  SURGERY  05/03/2010   Left shoulder surgery, Dr.Doldorf    . TMJ ARTHROPLASTY  1990   Social History   Socioeconomic History  . Marital status: Single    Spouse name: Not on file  . Number of children: 2  . Years of education: COLLEGE  . Highest education level: Not on file  Occupational History  . Occupation: Surveyor, minerals: [employer]  . Financial resource strain: Not on file  . Food insecurity    Worry: Not on file    Inability: Not on file  . Transportation needs    Medical: Not on file    Non-medical: Not on file  Tobacco Use  . Smoking status: Former Research scientist (life sciences)  . Smokeless tobacco: Never Used  . Tobacco comment: Social smoker quit at age 28  Substance and Sexual Activity  . Alcohol use: Yes    Alcohol/week: 0.0 standard drinks    Comment: 2 GLASSES WINE DAILY   . Drug use: No  . Sexual activity: Not on file  Lifestyle  . Physical activity    Days per week: Not on file    Minutes per session: Not on file  . Stress: Not on file  Relationships  . Social Herbalist on phone: Not on file    Gets together: Not on file    Attends religious service: Not on file  Active member of club or organization: Not on file    Attends meetings of clubs or organizations: Not on file    Relationship status: Not on file  Other Topics Concern  . Not on file  Social History Narrative   Lives at home w/ her fiance and daughter   Right-handed   Caffeine: 1 cup of coffe per day   No Known Allergies Family History  Problem Relation Age of Onset  . Cancer Mother        Pancreatic   . Cancer Maternal Aunt     Current Outpatient Medications (Endocrine & Metabolic):  Marland Kitchen.  ESTERIFIED ESTROGENS PO, Take by mouth. .  Progesterone Micronized (PROGESTERONE PO), Take by mouth.   Current Outpatient Medications (Respiratory):  .  cetirizine-pseudoephedrine (ZYRTEC-D) 5-120 MG tablet, One twice daily for congestion  Current Outpatient Medications  (Analgesics):  .  meloxicam (MOBIC) 15 MG tablet, Take 15 mg by mouth daily. .  naproxen sodium (ANAPROX) 220 MG tablet, Take 220 mg by mouth 2 (two) times daily as needed.   Current Outpatient Medications (Other):  Marland Kitchen.  ALPRAZolam (XANAX) 0.25 MG tablet, Take 1 tablet (0.25 mg total) by mouth 2 (two) times daily as needed for anxiety. Marland Kitchen.  amitriptyline (ELAVIL) 25 MG tablet, Take 1 tablet (25 mg total) by mouth at bedtime. .  B Complex Vitamins (B COMPLEX PO), Take by mouth. Marland Kitchen.  BIOTIN FORTE PO, Take by mouth. .  magnesium gluconate (MAGONATE) 500 MG tablet, Take 500 mg by mouth 2 (two) times daily. .  Multiple Vitamins-Minerals (MULTI COMPLETE PO), Take by mouth. Marland Kitchen.  OVER THE COUNTER MEDICATION, Cellulegs    Past medical history, social, surgical and family history all reviewed in electronic medical record.  No pertanent information unless stated regarding to the chief complaint.   Review of Systems:  No headache, visual changes, nausea, vomiting, diarrhea, constipation, dizziness, abdominal pain, skin rash, fevers, chills, night sweats, weight loss, swollen lymph nodes, body aches, joint swelling,chest pain, shortness of breath, mood changes.  Positive muscle aches  Objective  Blood pressure 120/80, pulse 73, height 5' 4.5" (1.638 m), weight 146 lb (66.2 kg), SpO2 97 %.   General: No apparent distress alert and oriented x3 mood and affect normal, dressed appropriately.  HEENT: Pupils equal, extraocular movements intact  Respiratory: Patient's speak in full sentences and does not appear short of breath  Cardiovascular: No lower extremity edema, non tender, no erythema  Skin: Warm dry intact with no signs of infection or rash on extremities or on axial skeleton.  Abdomen: Soft nontender  Neuro: Cranial nerves II through XII are intact, neurovascularly intact in all extremities with 2+ DTRs and 2+ pulses.  Lymph: No lymphadenopathy of posterior or anterior cervical chain or axillae  bilaterally.  Gait normal with good balance and coordination.  MSK:  Non tender with full range of motion and good stability and symmetric strength and tone of shoulders, elbows, wrist, hip, knee bilaterally.  Right foot exam shows the patient does have breakdown of the transverse arch.  Patient does have a positive squeeze test of the foot.  Patient is tender to palpation there as well as over the peroneal tendon.  Patient has mild decrease in range of motion lacking the last 5 degrees of extension of the ankle.  Negative Tinel's test.    Impression and Recommendations:     This case required medical decision making of moderate complexity. The above documentation has been reviewed and is accurate and complete  Lyndal Pulley, DO       Note: This dictation was prepared with Dragon dictation along with smaller phrase technology. Any transcriptional errors that result from this process are unintentional.

## 2018-08-01 NOTE — Patient Instructions (Signed)
Cut Body Helix per Dr. Thompson Caul instructions. Will call when we get her orthotics in and then we'll want to see you 4 weeks after the orthotics arrive. Will consider injections if no relief w/ symptoms.

## 2018-08-08 DIAGNOSIS — F4323 Adjustment disorder with mixed anxiety and depressed mood: Secondary | ICD-10-CM | POA: Diagnosis not present

## 2018-08-29 ENCOUNTER — Other Ambulatory Visit: Payer: Self-pay

## 2018-08-29 ENCOUNTER — Ambulatory Visit: Payer: BC Managed Care – PPO | Admitting: Family Medicine

## 2018-08-29 ENCOUNTER — Ambulatory Visit: Payer: Self-pay

## 2018-08-29 ENCOUNTER — Encounter: Payer: Self-pay | Admitting: Family Medicine

## 2018-08-29 VITALS — BP 146/90 | HR 62 | Ht 64.5 in | Wt 146.0 lb

## 2018-08-29 DIAGNOSIS — M7671 Peroneal tendinitis, right leg: Secondary | ICD-10-CM

## 2018-08-29 DIAGNOSIS — G8929 Other chronic pain: Secondary | ICD-10-CM | POA: Diagnosis not present

## 2018-08-29 DIAGNOSIS — M25571 Pain in right ankle and joints of right foot: Secondary | ICD-10-CM

## 2018-08-29 DIAGNOSIS — M216X1 Other acquired deformities of right foot: Secondary | ICD-10-CM

## 2018-08-29 NOTE — Patient Instructions (Signed)
Tried injection today Will watch neuroma Try the orthotics increase wear 1 hour a day See me again in 6 weeks

## 2018-08-29 NOTE — Assessment & Plan Note (Addendum)
Orthotics noted.  Custom molds made today.  Tolerated the procedure well.  If continued and pain patient does have a neuroma and may need injection.

## 2018-08-29 NOTE — Progress Notes (Signed)
Tawana ScaleZach Inaaya Vellucci D.O. [facility] [address] [place], [place] [address] Phone: [phone] Subjective:   I Patricia NighKana Thompson am serving as a Neurosurgeonscribe for Dr. Antoine PrimasZachary Bernie Ransford.  I'm seeing this patient by the request  of:    CC: Right ankle and foot pain follow-up  BJY:[id number]:Subjective   08/01/2018 Patient has had surgical intervention already on the toe.  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  Patient wanted to avoid any type of steroid injection today.  We will set up for custom orthotics.  Encourage patient continue to wear good shoes.  Continue the compression on the ankle as possible.  Otherwise we will consider injection of the neuroma, peroneal tendon, or the peroneal gastroc impingement.  Follow-up again in 4 weeks  8/202/2020 Patricia Lloyd is a 58 y.o. female coming in with complaint of right ankle pain. States that she is not doing better. Would like to be fitted for orthotics today.  Patient states that the ankle pain seems to be worse than the foot pain.  States that the ankle pain started earlier in the day and gives her more trouble.  Feels like she compensates and unfortunately starts giving her more pain.         Past Medical History:  Diagnosis Date  . Dizziness and giddiness 12/25/2012  . Fibromyalgia   . H/O fatigue   . Insomnia   . PONV (postoperative nausea and vomiting)    severe  . Poor circulation    wears support stockings   Past Surgical History:  Procedure Laterality Date  . CESAREAN SECTION  1995  . CESAREAN SECTION  2000  . DILATATION & CURETTAGE/HYSTEROSCOPY WITH TRUECLEAR N/A 01/01/2013   Procedure: DILATATION & CURETTAGE/HYSTEROSCOPY WITH TRUCLEAR;  Surgeon: Meriel Picaichard M Holland, MD;  Location: [facility];  Service: Gynecology;  Laterality: N/A;  . SHOULDER SURGERY  05/03/2010   Left shoulder surgery, Dr.Doldorf    . TMJ ARTHROPLASTY  1990   Social History   Socioeconomic History  . Marital status: Single     Spouse name: Not on file  . Number of children: 2  . Years of education: COLLEGE  . Highest education level: Not on file  Occupational History  . Occupation: Water quality scientistDress Code    Employer: [employer]  Social Needs  . Financial resource strain: Not on file  . Food insecurity    Worry: Not on file    Inability: Not on file  . Transportation needs    Medical: Not on file    Non-medical: Not on file  Tobacco Use  . Smoking status: Former Games developermoker  . Smokeless tobacco: Never Used  . Tobacco comment: Social smoker quit at age 58  Substance and Sexual Activity  . Alcohol use: Yes    Alcohol/week: 0.0 standard drinks    Comment: 2 GLASSES WINE DAILY   . Drug use: No  . Sexual activity: Not on file  Lifestyle  . Physical activity    Days per week: Not on file    Minutes per session: Not on file  . Stress: Not on file  Relationships  . Social Musicianconnections    Talks on phone: Not on file    Gets together: Not on file    Attends religious service: Not on file    Active member of club or organization: Not on file    Attends meetings of clubs or organizations: Not on file    Relationship status: Not on  file  Other Topics Concern  . Not on file  Social History Narrative   Lives at home w/ her fiance and daughter   Right-handed   Caffeine: 1 cup of coffe per day   No Known Allergies Family History  Problem Relation Age of Onset  . Cancer Mother        Pancreatic   . Cancer Maternal Aunt     Current Outpatient Medications (Endocrine & Metabolic):  Marland Kitchen  ESTERIFIED ESTROGENS PO, Take by mouth. .  Progesterone Micronized (PROGESTERONE PO), Take by mouth.   Current Outpatient Medications (Respiratory):  .  cetirizine-pseudoephedrine (ZYRTEC-D) 5-120 MG tablet, One twice daily for congestion  Current Outpatient Medications (Analgesics):  .  meloxicam (MOBIC) 15 MG tablet, Take 15 mg by mouth daily. .  naproxen sodium (ANAPROX) 220 MG tablet, Take 220 mg by mouth 2 (two) times  daily as needed.   Current Outpatient Medications (Other):  Marland Kitchen  ALPRAZolam (XANAX) 0.25 MG tablet, Take 1 tablet (0.25 mg total) by mouth 2 (two) times daily as needed for anxiety. Marland Kitchen  amitriptyline (ELAVIL) 25 MG tablet, Take 1 tablet (25 mg total) by mouth at bedtime. .  B Complex Vitamins (B COMPLEX PO), Take by mouth. Marland Kitchen  BIOTIN FORTE PO, Take by mouth. .  magnesium gluconate (MAGONATE) 500 MG tablet, Take 500 mg by mouth 2 (two) times daily. .  Multiple Vitamins-Minerals (MULTI COMPLETE PO), Take by mouth. Marland Kitchen  OVER THE COUNTER MEDICATION, Cellulegs    Past medical history, social, surgical and family history all reviewed in electronic medical record.  No pertanent information unless stated regarding to the chief complaint.   Review of Systems:  No headache, visual changes, nausea, vomiting, diarrhea, constipation, dizziness, abdominal pain, skin rash, fevers, chills, night sweats, weight loss, swollen lymph nodes, body aches, joint swelling, muscle aches, chest pain, shortness of breath, mood changes.   Objective  Blood pressure (!) 146/90, pulse 62, height 5' 4.5" (1.638 m), weight 146 lb (66.2 kg), SpO2 98 %.  General: No apparent distress alert and oriented x3 mood and affect normal, dressed appropriately.  HEENT: Pupils equal, extraocular movements intact  Respiratory: Patient's speak in full sentences and does not appear short of breath  Cardiovascular: No lower extremity edema, non tender, no erythema  Skin: Warm dry intact with no signs of infection or rash on extremities or on axial skeleton.  Abdomen: Soft nontender  Neuro: Cranial nerves II through XII are intact, neurovascularly intact in all extremities with 2+ DTRs and 2+ pulses.  Lymph: No lymphadenopathy of posterior or anterior cervical chain or axillae bilaterally.  Gait normal with good balance and coordination.  MSK:  Non tender with full range of motion and good stability and symmetric strength and tone of  shoulders, elbows, wrist, hip, knee bilaterally.  Right ankle exam shows some tenderness to palpation over the peroneal tendons still noted.  Mild swelling compared to the contralateral side.  Foot exam shows severe breakdown of the transverse arch as well as overpronation of the hindfoot.  Patient does have a positive squeeze test.   Procedure: Real-time Ultrasound Guided Injection of right peroneal tendon sheath  Device: GE Logiq Q7 Ultrasound guided injection is preferred based studies that show increased duration, increased effect, greater accuracy, decreased procedural pain, increased response rate, and decreased cost with ultrasound guided versus blind injection.  Verbal informed consent obtained.  Time-out conducted.  Noted no overlying erythema, induration, or other signs of local infection.  Skin prepped in a  sterile fashion.  Local anesthesia: Topical Ethyl chloride.  With sterile technique and under real time ultrasound guidance: With a 25-gauge half inch needle injected with 0.5 cc of 0.5% Marcaine and 0.5 cc of Kenalog 40 mg/mL Completed without difficulty  Pain immediately resolved suggesting accurate placement of the medication.  Advised to call if fevers/chills, erythema, induration, drainage, or persistent bleeding.  Images permanently stored and available for review in the ultrasound unit.  Impression: Technically successful ultrasound guided injection.  Procedure Note   Patient was fitted for a : standard, cushioned, semi-rigid orthotic. The orthotic was heated and afterward the patient patient seated position and molded The patient was positioned in subtalar neutral position and 10 degrees of ankle dorsiflexion in a weight bearing stance. After completion of molding, patient did have orthotic management The blank was ground to a stable position for weight bearing. Size: Base: Carbon fiber Additional Posting and Padding: Orthotics Transverse arch 250/35 Transverse  270/90 Lateral 200/40  The patient ambulated these, and they were very comfortable.    Impression and Recommendations:     This case required medical decision making of moderate complexity. The above documentation has been reviewed and is accurate and complete Judi SaaZachary M Vieva Brummitt, DO       Note: This dictation was prepared with Dragon dictation along with smaller phrase technology. Any transcriptional errors that result from this process are unintentional.

## 2018-08-29 NOTE — Assessment & Plan Note (Signed)
Patient given injection today.  Tolerated the procedure well.  Patient wanted to hold on injection of the neuroma.  Discussed which activities to do which wants to avoid.  Increase activity as tolerated.  Follow-up again in 4 to 8 weeks if continues to have pain I would consider the injection in the neuroma.  We did discuss the possibility of physical therapy for the left foot and ankle.

## 2018-09-10 DIAGNOSIS — D3142 Benign neoplasm of left ciliary body: Secondary | ICD-10-CM | POA: Diagnosis not present

## 2018-09-11 DIAGNOSIS — F4323 Adjustment disorder with mixed anxiety and depressed mood: Secondary | ICD-10-CM | POA: Diagnosis not present

## 2018-09-20 DIAGNOSIS — R0781 Pleurodynia: Secondary | ICD-10-CM | POA: Diagnosis not present

## 2018-10-01 DIAGNOSIS — F4323 Adjustment disorder with mixed anxiety and depressed mood: Secondary | ICD-10-CM | POA: Diagnosis not present

## 2018-10-08 ENCOUNTER — Ambulatory Visit: Payer: BC Managed Care – PPO | Admitting: Family Medicine

## 2018-10-10 ENCOUNTER — Other Ambulatory Visit: Payer: Self-pay | Admitting: Emergency Medicine

## 2018-10-10 DIAGNOSIS — Z20822 Contact with and (suspected) exposure to covid-19: Secondary | ICD-10-CM

## 2018-10-11 DIAGNOSIS — F4323 Adjustment disorder with mixed anxiety and depressed mood: Secondary | ICD-10-CM | POA: Diagnosis not present

## 2018-10-11 LAB — NOVEL CORONAVIRUS, NAA: SARS-CoV-2, NAA: NOT DETECTED

## 2018-10-28 ENCOUNTER — Telehealth: Payer: Self-pay

## 2018-10-28 NOTE — Telephone Encounter (Signed)
Patient called sates she was a patient here years ago and her doctor retired she want to know if you would take her back, she said she needs a physical and a flu shot.

## 2018-10-29 NOTE — Telephone Encounter (Signed)
LVM that Dr Renold Genta is not accepting any more patients at this time.

## 2018-10-29 NOTE — Telephone Encounter (Signed)
Please call her cannot accept at this time. No physical exam slots until Feb.

## 2018-11-13 DIAGNOSIS — F4323 Adjustment disorder with mixed anxiety and depressed mood: Secondary | ICD-10-CM | POA: Diagnosis not present

## 2018-11-27 DIAGNOSIS — F4323 Adjustment disorder with mixed anxiety and depressed mood: Secondary | ICD-10-CM | POA: Diagnosis not present

## 2018-12-18 DIAGNOSIS — Z803 Family history of malignant neoplasm of breast: Secondary | ICD-10-CM | POA: Diagnosis not present

## 2018-12-18 DIAGNOSIS — Z1231 Encounter for screening mammogram for malignant neoplasm of breast: Secondary | ICD-10-CM | POA: Diagnosis not present

## 2018-12-19 DIAGNOSIS — F4323 Adjustment disorder with mixed anxiety and depressed mood: Secondary | ICD-10-CM | POA: Diagnosis not present

## 2019-01-15 ENCOUNTER — Other Ambulatory Visit: Payer: Self-pay

## 2019-01-16 ENCOUNTER — Encounter: Payer: Self-pay | Admitting: Family Medicine

## 2019-01-16 ENCOUNTER — Ambulatory Visit (INDEPENDENT_AMBULATORY_CARE_PROVIDER_SITE_OTHER): Payer: BC Managed Care – PPO | Admitting: Family Medicine

## 2019-01-16 VITALS — Temp 97.8°F | Ht 63.0 in | Wt 146.0 lb

## 2019-01-16 DIAGNOSIS — R42 Dizziness and giddiness: Secondary | ICD-10-CM | POA: Diagnosis not present

## 2019-01-16 DIAGNOSIS — Z23 Encounter for immunization: Secondary | ICD-10-CM

## 2019-01-16 DIAGNOSIS — L989 Disorder of the skin and subcutaneous tissue, unspecified: Secondary | ICD-10-CM | POA: Diagnosis not present

## 2019-01-16 DIAGNOSIS — R32 Unspecified urinary incontinence: Secondary | ICD-10-CM

## 2019-01-16 DIAGNOSIS — R002 Palpitations: Secondary | ICD-10-CM

## 2019-01-16 LAB — BASIC METABOLIC PANEL
BUN: 19 mg/dL (ref 6–23)
CO2: 28 mEq/L (ref 19–32)
Calcium: 8.9 mg/dL (ref 8.4–10.5)
Chloride: 104 mEq/L (ref 96–112)
Creatinine, Ser: 0.73 mg/dL (ref 0.40–1.20)
GFR: 81.11 mL/min (ref >60.00)
Glucose, Bld: 83 mg/dL (ref 70–99)
Potassium: 3.8 mEq/L (ref 3.5–5.1)
Sodium: 139 mEq/L (ref 135–145)

## 2019-01-16 LAB — CBC
HCT: 37.8 % (ref 36.0–46.0)
Hemoglobin: 12.5 g/dL (ref 12.0–15.0)
MCHC: 33 g/dL (ref 30.0–36.0)
MCV: 91 fl (ref 78.0–100.0)
Platelets: 207 10*3/uL (ref 150.0–400.0)
RBC: 4.16 Mil/uL (ref 3.87–5.11)
RDW: 13.4 % (ref 11.5–15.5)
WBC: 6.7 10*3/uL (ref 4.0–10.5)

## 2019-01-16 LAB — T4, FREE: Free T4: 0.95 ng/dL (ref 0.60–1.60)

## 2019-01-16 LAB — TSH: TSH: 2.36 u[IU]/mL (ref 0.35–4.50)

## 2019-01-16 NOTE — Patient Instructions (Signed)
Dizziness Dizziness is a common problem. It is a feeling of unsteadiness or light-headedness. You may feel like you are about to faint. Dizziness can lead to injury if you stumble or fall. Anyone can become dizzy, but dizziness is more common in older adults. This condition can be caused by a number of things, including medicines, dehydration, or illness. Follow these instructions at home: Eating and drinking  Drink enough fluid to keep your urine clear or pale yellow. This helps to keep you from becoming dehydrated. Try to drink more clear fluids, such as water.  Do not drink alcohol.  Limit your caffeine intake if told to do so by your health care provider. Check ingredients and nutrition facts to see if a food or beverage contains caffeine.  Limit your salt (sodium) intake if told to do so by your health care provider. Check ingredients and nutrition facts to see if a food or beverage contains sodium. Activity  Avoid making quick movements. ? Rise slowly from chairs and steady yourself until you feel okay. ? In the morning, first sit up on the side of the bed. When you feel okay, stand slowly while you hold onto something until you know that your balance is fine.  If you need to stand in one place for a long time, move your legs often. Tighten and relax the muscles in your legs while you are standing.  Do not drive or use heavy machinery if you feel dizzy.  Avoid bending down if you feel dizzy. Place items in your home so that they are easy for you to reach without leaning over. Lifestyle  Do not use any products that contain nicotine or tobacco, such as cigarettes and e-cigarettes. If you need help quitting, ask your health care provider.  Try to reduce your stress level by using methods such as yoga or meditation. Talk with your health care provider if you need help to manage your stress. General instructions  Watch your dizziness for any changes.  Take over-the-counter and  prescription medicines only as told by your health care provider. Talk with your health care provider if you think that your dizziness is caused by a medicine that you are taking.  Tell a friend or a family member that you are feeling dizzy. If he or she notices any changes in your behavior, have this person call your health care provider.  Keep all follow-up visits as told by your health care provider. This is important. Contact a health care provider if:  Your dizziness does not go away.  Your dizziness or light-headedness gets worse.  You feel nauseous.  You have reduced hearing.  You have new symptoms.  You are unsteady on your feet or you feel like the room is spinning. Get help right away if:  You vomit or have diarrhea and are unable to eat or drink anything.  You have problems talking, walking, swallowing, or using your arms, hands, or legs.  You feel generally weak.  You are not thinking clearly or you have trouble forming sentences. It may take a friend or family member to notice this.  You have chest pain, abdominal pain, shortness of breath, or sweating.  Your vision changes.  You have any bleeding.  You have a severe headache.  You have neck pain or a stiff neck.  You have a fever. These symptoms may represent a serious problem that is an emergency. Do not wait to see if the symptoms will go away. Get medical help   right away. Call your local emergency services (911 in the [place] Do not drive yourself to the hospital. Summary  Dizziness is a feeling of unsteadiness or light-headedness. This condition can be caused by a number of things, including medicines, dehydration, or illness.  Anyone can become dizzy, but dizziness is more common in older adults.  Drink enough fluid to keep your urine clear or pale yellow. Do not drink alcohol.  Avoid making quick movements if you feel dizzy. Monitor your dizziness for any changes. This information is not intended to  replace advice given to you by your health care provider. Make sure you discuss any questions you have with your health care provider. Document Revised: 12/29/2016 Document Reviewed: 01/29/2016 Elsevier Patient Education  [place].  Palpitations Palpitations are feelings that your heartbeat is irregular or is faster than normal. It may feel like your heart is fluttering or skipping a beat. Palpitations are usually not a serious problem. They may be caused by many things, including smoking, caffeine, alcohol, stress, and certain medicines or drugs. Most causes of palpitations are not serious. However, some palpitations can be a sign of a serious problem. You may need further tests to rule out serious medical problems. Follow these instructions at home:     Pay attention to any changes in your condition. Take these actions to help manage your symptoms: Eating and drinking  Avoid foods and drinks that may cause palpitations. These may include: ? Caffeinated coffee, tea, soft drinks, diet pills, and energy drinks. ? Chocolate. ? Alcohol. Lifestyle  Take steps to reduce your stress and anxiety. Things that can help you relax include: ? Yoga. ? Mind-body activities, such as deep breathing, meditation, or using words and images to create positive thoughts (guided imagery). ? Physical activity, such as swimming, jogging, or walking. Tell your health care provider if your palpitations increase with activity. If you have chest pain or shortness of breath with activity, do not continue the activity until you are seen by your health care provider. ? Biofeedback. This is a method that helps you learn to use your mind to control things in your body, such as your heartbeat.  Do not use drugs, including cocaine or ecstasy. Do not use marijuana.  Get plenty of rest and sleep. Keep a regular bed time. General instructions  Take over-the-counter and prescription medicines only as told by your  health care provider.  Do not use any products that contain nicotine or tobacco, such as cigarettes and e-cigarettes. If you need help quitting, ask your health care provider.  Keep all follow-up visits as told by your health care provider. This is important. These may include visits for further testing if palpitations do not go away or get worse. Contact a health care provider if you:  Continue to have a fast or irregular heartbeat after 24 hours.  Notice that your palpitations occur more often. Get help right away if you:  Have chest pain or shortness of breath.  Have a severe headache.  Feel dizzy or you faint. Summary  Palpitations are feelings that your heartbeat is irregular or is faster than normal. It may feel like your heart is fluttering or skipping a beat.  Palpitations may be caused by many things, including smoking, caffeine, alcohol, stress, certain medicines, and drugs.  Although most causes of palpitations are not serious, some causes can be a sign of a serious medical problem.  Get help right away if you faint or have chest pain, shortness  of breath, a severe headache, or dizziness. This information is not intended to replace advice given to you by your health care provider. Make sure you discuss any questions you have with your health care provider. Document Revised: 02/07/2017 Document Reviewed: 02/07/2017 Elsevier Patient Education  2020 [employer].  Urinary Incontinence  Urinary incontinence refers to a condition in which a person is unable to control where and when to pass urine. A person with this condition will urinate when he or she does not mean to (involuntarily). What are the causes? This condition may be caused by:  Medicines.  Infections.  Constipation.  Overactive bladder muscles.  Weak bladder muscles.  Weak pelvic floor muscles. These muscles provide support for the bladder, intestine, and, in women, the uterus.  Enlarged prostate in  men. The prostate is a gland near the bladder. When it gets too big, it can pinch the urethra. With the urethra blocked, the bladder can weaken and lose the ability to empty properly.  Surgery.  Emotional factors, such as anxiety, stress, or post-traumatic stress disorder (PTSD).  Pelvic organ prolapse. This happens in women when organs shift out of place and into the vagina. This shift can prevent the bladder and urethra from working properly. What increases the risk? The following factors may make you more likely to develop this condition:  Older age.  Obesity and physical inactivity.  Pregnancy and childbirth.  Menopause.  Diseases that affect the nerves or spinal cord (neurological diseases).  Long-term (chronic) coughing. This can increase pressure on the bladder and pelvic floor muscles. What are the signs or symptoms? Symptoms may vary depending on the type of urinary incontinence you have. They include:  A sudden urge to urinate, but passing urine involuntarily before you can get to a bathroom (urge incontinence).  Suddenly passing urine with any activity that forces urine to pass, such as coughing, laughing, exercise, or sneezing (stress incontinence).  Needing to urinate often, but urinating only a small amount, or constantly dribbling urine (overflow incontinence).  Urinating because you cannot get to the bathroom in time due to a physical disability, such as arthritis or injury, or communication and thinking problems, such as Alzheimer disease (functional incontinence). How is this diagnosed? This condition may be diagnosed based on:  Your medical history.  A physical exam.  Tests, such as: ? Urine tests. ? X-rays of your kidney and bladder. ? Ultrasound. ? CT scan. ? Cystoscopy. In this procedure, a health care provider inserts a tube with a light and camera (cystoscope) through the urethra and into the bladder in order to check for problems. ? Urodynamic  testing. These tests assess how well the bladder, urethra, and sphincter can store and release urine. There are different types of urodynamic tests, and they vary depending on what the test is measuring. To help diagnose your condition, your health care provider may recommend that you keep a log of when you urinate and how much you urinate. How is this treated? Treatment for this condition depends on the type of incontinence that you have and its cause. Treatment may include:  Lifestyle changes, such as: ? Quitting smoking. ? Maintaining a healthy weight. ? Staying active. Try to get 150 minutes of moderate-intensity exercise every week. Ask your health care provider which activities are safe for you. ? Eating a healthy diet.  Avoid high-fat foods, like fried foods.  Avoid refined carbohydrates like white bread and white rice.  Limit how much alcohol and caffeine you drink.  Increase your fiber intake. Foods such as fresh fruits, vegetables, beans, and whole grains are healthy sources of fiber.  Pelvic floor muscle exercises.  Bladder training, such as lengthening the amount of time between bathroom breaks, or using the bathroom at regular intervals.  Using techniques to suppress bladder urges. This can include distraction techniques or controlled breathing exercises.  Medicines to relax the bladder muscles and prevent bladder spasms.  Medicines to help slow or prevent the growth of a man's prostate.  Botox injections. These can help relax the bladder muscles.  Using pulses of electricity to help change bladder reflexes (electrical nerve stimulation).  For women, using a medical device to prevent urine leaks. This is a small, tampon-like, disposable device that is inserted into the urethra.  Injecting collagen or carbon beads (bulking agents) into the urinary sphincter. These can help thicken tissue and close the bladder opening.  Surgery. Follow these instructions at  home: Lifestyle  Limit alcohol and caffeine. These can fill your bladder quickly and irritate it.  Keep yourself clean to help prevent odors and skin damage. Ask your doctor about special skin creams and cleansers that can protect the skin from urine.  Consider wearing pads or adult diapers. Make sure to change them regularly, and always change them right after experiencing incontinence. General instructions  Take over-the-counter and prescription medicines only as told by your health care provider.  Use the bathroom about every 3-4 hours, even if you do not feel the need to urinate. Try to empty your bladder completely every time. After urinating, wait a minute. Then try to urinate again.  Make sure you are in a relaxed position while urinating.  If your incontinence is caused by nerve problems, keep a log of the medicines you take and the times you go to the bathroom.  Keep all follow-up visits as told by your health care provider. This is important. Contact a health care provider if:  You have pain that gets worse.  Your incontinence gets worse. Get help right away if:  You have a fever or chills.  You are unable to urinate.  You have redness in your groin area or down your legs. Summary  Urinary incontinence refers to a condition in which a person is unable to control where and when to pass urine.  This condition may be caused by medicines, infection, weak bladder muscles, weak pelvic floor muscles, enlargement of the prostate (in men), or surgery.  The following factors increase your risk for developing this condition: older age, obesity, pregnancy and childbirth, menopause, neurological diseases, and chronic coughing.  There are several types of urinary incontinence. They include urge incontinence, stress incontinence, overflow incontinence, and functional incontinence.  This condition is usually treated first with lifestyle and behavioral changes, such as quitting  smoking, eating a healthier diet, and doing regular pelvic floor exercises. Other treatment options include medicines, bulking agents, medical devices, electrical nerve stimulation, or surgery. This information is not intended to replace advice given to you by your health care provider. Make sure you discuss any questions you have with your health care provider. Document Revised: 01/05/2017 Document Reviewed: 04/06/2016 Elsevier Patient Education  2020 [employer]  Lipoma  A lipoma is a noncancerous (benign) tumor that is made up of fat cells. This is a very common type of soft-tissue growth. Lipomas are usually found under the skin (subcutaneous). They may occur in any tissue of the body that contains fat. Common areas for lipomas to appear include the  back, arms, shoulders, buttocks, and thighs. Lipomas grow slowly, and they are usually painless. Most lipomas do not cause problems and do not require treatment. What are the causes? The cause of this condition is not known. What increases the risk? You are more likely to develop this condition if:  You are 22-46 years old.  You have a family history of lipomas. What are the signs or symptoms? A lipoma usually appears as a small, round bump under the skin. In most cases, the lump will:  Feel soft or rubbery.  Not cause pain or other symptoms. However, if a lipoma is located in an area where it pushes on nerves, it can become painful or cause other symptoms. How is this diagnosed? A lipoma can usually be diagnosed with a physical exam. You may also have tests to confirm the diagnosis and to rule out other conditions. Tests may include:  Imaging tests, such as a CT scan or an MRI.  Removal of a tissue sample to be looked at under a microscope (biopsy). How is this treated? Treatment for this condition depends on the size of the lipoma and whether it is causing any symptoms.  For small lipomas that are not causing problems, no  treatment is needed.  If a lipoma is bigger or it causes problems, surgery may be done to remove the lipoma. Lipomas can also be removed to improve appearance. Most often, the procedure is done after applying a medicine that numbs the area (local anesthetic).  Liposuction may be done to reduce the size of the lipoma before it is removed through surgery, or it may be done to remove the lipoma. Lipomas are removed with this method in order to limit incision size and scarring. A liposuction tube is inserted through a small incision into the lipoma, and the contents of the lipoma are removed through the tube with suction. Follow these instructions at home:  Watch your lipoma for any changes.  Keep all follow-up visits as told by your health care provider. This is important. Contact a health care provider if:  Your lipoma becomes larger or hard.  Your lipoma becomes painful, red, or increasingly swollen. These could be signs of infection or a more serious condition. Get help right away if:  You develop tingling or numbness in an area near the lipoma. This could indicate that the lipoma is causing nerve damage. Summary  A lipoma is a noncancerous tumor that is made up of fat cells.  Most lipomas do not cause problems and do not require treatment.  If a lipoma is bigger or it causes problems, surgery may be done to remove the lipoma.  Contact a health care provider if your lipoma becomes larger or hard, or if it becomes painful, red, or increasingly swollen. Pain, redness, and swelling could be signs of infection or a more serious condition. This information is not intended to replace advice given to you by your health care provider. Make sure you discuss any questions you have with your health care provider. Document Revised: 08/12/2018 Document Reviewed: 08/12/2018 Elsevier Patient Education  2020 [employer].

## 2019-01-16 NOTE — Progress Notes (Addendum)
Patient presents to clinic today for acute concern and to establish care.  SUBJECTIVE: PMH: Pt is a 59 yo female with pmh sig for urinary incontinence, fibromyalgia.  Previously seen by Raechel Chute, MD. Pt wants to lose weight.  Inquires about intermittent fasting.    Dizziness: -pt with dizziness x 6 months -occurs intermittently (while driving, in the kitchen, etc) -pt feels presyncopal -does not notice sensation when moving from laying to sitting or standing -feels different from vertigo -may forget to eat lunch if busy.  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  States there was a lot of dust/debris in the air.  Incontinence -notes ongoing h/o urinary incontinence. -endorses leakage with laughing and other activities -Being followed by specialist.  Has upcoming appt  Skin lesion: -pt notes a bump on her forehead -present x a while -at times tender to the touch -denies injury, erythema, or drainage.  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  She is currently in the process of opening a new store in [place].  Pt has a daughter named Fleet Contras.  Pt endorses social EtOH use.  Pt denies tobacco and drug use.  Family Med Hx: Dad-HTN Brother-aortic aneurysm Sister-heart issues  Past Medical History:  Diagnosis Date  . Dizziness and giddiness 12/25/2012  . Fibromyalgia   . H/O fatigue   . Insomnia   . PONV (postoperative nausea and vomiting)    severe  . Poor circulation    wears support stockings    Past Surgical History:  Procedure Laterality Date  . CESAREAN SECTION  1995  . CESAREAN SECTION  2000  . DILATATION & CURETTAGE/HYSTEROSCOPY WITH TRUECLEAR N/A 01/01/2013   Procedure: DILATATION & CURETTAGE/HYSTEROSCOPY WITH TRUCLEAR;  Surgeon:  Meriel Pica, MD;  Location: [facility];  Service: Gynecology;  Laterality: N/A;  . SHOULDER SURGERY  05/03/2010   Left shoulder surgery, Dr.Doldorf    . TMJ ARTHROPLASTY  1990    Current Outpatient Medications on File Prior to Visit  Medication Sig Dispense Refill  . ALPRAZolam (XANAX) 0.25 MG tablet Take 1 tablet (0.25 mg total) by mouth 2 (two) times daily as needed for anxiety. 30 tablet 0  . amitriptyline (ELAVIL) 25 MG tablet Take 1 tablet (25 mg total) by mouth at bedtime. 30 tablet 1  . B Complex Vitamins (B COMPLEX PO) Take by mouth.    Marland Kitchen BIOTIN FORTE PO Take by mouth.    . cetirizine-pseudoephedrine (ZYRTEC-D) 5-120 MG tablet One twice daily for congestion 20 tablet 1  . ESTERIFIED ESTROGENS PO Take by mouth.    . magnesium gluconate (MAGONATE) 500 MG tablet Take 500 mg by mouth 2 (two) times daily.    . meloxicam (MOBIC) 15 MG tablet Take 15 mg by mouth daily.  0  . Multiple Vitamins-Minerals (MULTI COMPLETE PO) Take by mouth.    . naproxen sodium (ANAPROX) 220 MG tablet Take 220 mg by mouth 2 (two) times daily as needed.    Marland Kitchen OVER THE COUNTER MEDICATION Cellulegs    . Progesterone Micronized (PROGESTERONE PO) Take by mouth.     No current facility-administered medications on file prior to visit.    No Known Allergies  Family History  Problem Relation Age of Onset  . Cancer Mother  Pancreatic   . Cancer Maternal Aunt     Social History   Socioeconomic History  . Marital status: Single    Spouse name: Not on file  . Number of children: 2  . Years of education: COLLEGE  . Highest education level: Not on file  Occupational History  . Occupation: Water quality scientist: [employer]  Tobacco Use  . Smoking status: Former Games developer  . Smokeless tobacco: Never Used  . Tobacco comment: Social smoker quit at age 54  Substance and Sexual Activity  . Alcohol use: Yes    Alcohol/week: 0.0 standard drinks    Comment: 2 GLASSES WINE DAILY   . Drug use: No  .  Sexual activity: Not on file  Other Topics Concern  . Not on file  Social History Narrative   Lives at home w/ her fiance and daughter   Right-handed   Caffeine: 1 cup of coffe per day   Social Determinants of Health   Financial Resource Strain:   . Difficulty of Paying Living Expenses: Not on file  Food Insecurity:   . Worried About Programme researcher, broadcasting/film/video in the Last Year: Not on file  . Ran Out of Food in the Last Year: Not on file  Transportation Needs:   . Lack of Transportation (Medical): Not on file  . Lack of Transportation (Non-Medical): Not on file  Physical Activity:   . Days of Exercise per Week: Not on file  . Minutes of Exercise per Session: Not on file  Stress:   . Feeling of Stress : Not on file  Social Connections:   . Frequency of Communication with Friends and Family: Not on file  . Frequency of Social Gatherings with Friends and Family: Not on file  . Attends Religious Services: Not on file  . Active Member of Clubs or Organizations: Not on file  . Attends Banker Meetings: Not on file  . Marital Status: Not on file  Intimate Partner Violence:   . Fear of Current or Ex-Partner: Not on file  . Emotionally Abused: Not on file  . Physically Abused: Not on file  . Sexually Abused: Not on file    ROS General: Denies fever, chills, night sweats, changes in weight, changes in appetite  +dizziness HEENT: Denies headaches, ear pain, changes in vision, rhinorrhea, sore throat CV: Denies CP, palpitations, orthopnea  +SOB, heart flutter Pulm: Denies SOB, cough, wheezing +SOB GI: Denies abdominal pain, nausea, vomiting, diarrhea, constipation GU: Denies dysuria, hematuria, frequency, vaginal discharge  + urinary incontinence Msk: Denies muscle cramps, joint pains Neuro: Denies weakness, numbness, tingling Skin: Denies rashes, bruising  +bump on forehead Psych: Denies depression, anxiety, hallucinations  Temp 97.8 F (36.6 C) (Temporal)   Ht 5\' 3"   (1.6 m)   Wt 146 lb (66.2 kg)   LMP 12/12/2012   SpO2 97%   BMI 25.86 kg/m  118/78 Orthostatics: laying down: 136/90  P 54.   Sitting: 130/92 P58  Standing: 128/90  P60  Physical Exam Gen. Pleasant, well developed, well-nourished, in NAD HEENT - [place]/AT, PERRL, EOMI, no scleral icterus, no nasal drainage, pharynx without erythema or exudate. Neck: No JVD, no thyromegaly, no carotid bruits Lungs: no use of accessory muscles, CTAB, no wheezes, rales or rhonchi Cardiovascular: RRR, No r/g/m, no peripheral edema Abdomen: BS present, soft, nontender,nondistended Musculoskeletal: No deformities, moves all four extremities, no cyanosis or clubbing, normal tone Neuro:  A&Ox3, CN II-XII intact, normal gait Skin:  Warm,  dry, intact.  Mobile area of soft tissue edema on L forehead.  No TTP, erythema, or induration.    No results found for this or any previous visit (from the past 2160 hour(s)).  Assessment/Plan: Dizziness  -discussed possible causes including dehydration, hypoglycemia, arrhythmia, vertigo, etc -increase po hydration -discussed eating regular meals and or snacks.  Consider meal prep. - Plan: TSH, T4, Free, CBC (no diff), Basic Metabolic Panel, Ambulatory referral to Cardiology  Urinary incontinence, unspecified type -continue schedule f/u with specialist  Palpitations  -currently asymptomatic -Offered EKG, pt declined at this time. -limit caffeine intake - Plan: TSH, T4, Free, CBC (no diff), Ambulatory referral to Cardiology  Need for shingles vaccine  - Plan: Varicella-zoster vaccine IM (Shingrix)  Skin Lesion -forehead -soft tissue edema vs lipoma- though very subtle -discussed observation -consider referral to derm  F/u prn in the next few wks.  Grier Mitts, MD

## 2019-01-20 ENCOUNTER — Encounter: Payer: Self-pay | Admitting: Family Medicine

## 2019-01-20 DIAGNOSIS — R32 Unspecified urinary incontinence: Secondary | ICD-10-CM | POA: Insufficient documentation

## 2019-01-22 ENCOUNTER — Telehealth: Payer: Self-pay

## 2019-01-23 DIAGNOSIS — R32 Unspecified urinary incontinence: Secondary | ICD-10-CM | POA: Diagnosis not present

## 2019-01-28 ENCOUNTER — Other Ambulatory Visit: Payer: Self-pay | Admitting: Family Medicine

## 2019-01-28 MED ORDER — MELOXICAM 15 MG PO TABS: 15.0000 mg | ORAL_TABLET | Freq: Every day | ORAL | 1 refills | Status: DC

## 2019-01-28 NOTE — Telephone Encounter (Signed)
Rx sent however, pt should not take mobic in addition to Naproxen daily.

## 2019-01-30 DIAGNOSIS — R002 Palpitations: Secondary | ICD-10-CM | POA: Insufficient documentation

## 2019-01-30 DIAGNOSIS — Z7189 Other specified counseling: Secondary | ICD-10-CM | POA: Insufficient documentation

## 2019-01-30 NOTE — Progress Notes (Signed)
Cardiology Office Note   Date:  01/31/2019   ID:  Patricia Lloyd, DOB 01-Jul-1960, MRN [id number]  PCP:  Patricia Saint, MD  Cardiologist:   No primary care provider on file. Referring:  Patricia Saint, MD  Chief Complaint  Patient presents with  . Chest Pain  . Dizziness  . Palpitations      History of Present Illness: Patricia Lloyd is a 59 y.o. female who is referred by Patricia Saint, MD for evaluation of chest discomfort, dizziness and palpitations.  The patient reports that she gets episodes of feeling like her heart is skipping.  This has been going on for a year but seems to be worse with increased frequency happening a few times per week.  She notices it mostly at night.  She has other symptoms such as chest discomfort.  She had a severe episode while driving home not long ago.  It was 4 out of 10 in intensity.  It lasted about 40 minutes.  It was substernal.  It eventually went away.  She did not describe radiation to her jaw or to her arms.  She has not had this kind of pain before.  She gets some pinching discomfort in her pain occasionally.  She has sensations like her breath is catching.  She is not able to bring this on.  She does have dizziness and has been seen by neurology.  She actually had syncope a couple of years ago that was unexplained.  She is recently had a brother who was told that he had an enlarged aorta.  She is physically active but has some decreased exercise tolerance.  She is not had any resting shortness of breath, PND or orthopnea.  Has had no cough fevers or chills.   Past Medical History:  Diagnosis Date  . Fibromyalgia   . H/O fatigue   . Insomnia   . PONV (postoperative nausea and vomiting)    severe    Past Surgical History:  Procedure Laterality Date  . CESAREAN SECTION  1995  . CESAREAN SECTION  2000  . DILATATION & CURETTAGE/HYSTEROSCOPY WITH TRUECLEAR N/A 01/01/2013   Procedure: DILATATION & CURETTAGE/HYSTEROSCOPY WITH  TRUCLEAR;  Surgeon: Meriel Pica, MD;  Location: [facility];  Service: Gynecology;  Laterality: N/A;  . SHOULDER SURGERY  05/03/2010   Left shoulder surgery, Dr.Doldorf    . TMJ ARTHROPLASTY  1990     Current Outpatient Medications  Medication Sig Dispense Refill  . B Complex Vitamins (B COMPLEX PO) Take by mouth.    Marland Kitchen BIOTIN FORTE PO Take by mouth.    . cetirizine-pseudoephedrine (ZYRTEC-D) 5-120 MG tablet One twice daily for congestion 20 tablet 1  . ESTERIFIED ESTROGENS PO Take by mouth.    . magnesium gluconate (MAGONATE) 500 MG tablet Take 500 mg by mouth 2 (two) times daily.    . meloxicam (MOBIC) 15 MG tablet Take 1 tablet (15 mg total) by mouth daily. 30 tablet 1  . Multiple Vitamins-Minerals (MULTI COMPLETE PO) Take by mouth.    . naproxen sodium (ANAPROX) 220 MG tablet Take 220 mg by mouth 2 (two) times daily as needed.    Marland Kitchen OVER THE COUNTER MEDICATION Cellulegs    . Progesterone Micronized (PROGESTERONE PO) Take by mouth.     No current facility-administered medications for this visit.    Allergies:   Patient has no known allergies.    Social History:  The patient  reports that she has quit  smoking. She has never used smokeless tobacco. She reports current alcohol use. She reports that she does not use drugs.   Family History:  The patient's family history includes Cancer in her maternal aunt and mother; Hypertension in her father.  Her brother is been told he gotten a large aorta.  Her sister has had some kind of a cardiac evaluation.   ROS:  Please see the history of present illness.   Otherwise, review of systems are positive for none.   All other systems are reviewed and negative.    PHYSICAL EXAM: VS:  BP 130/78   Pulse 68   Temp (!) 97.1 F (36.2 C)   Ht 5\' 3"  (1.6 m)   Wt 145 lb (65.8 kg)   LMP 12/12/2012   BMI 25.69 kg/m  , BMI Body mass index is 25.69 kg/m. GENERAL:  Well appearing HEENT:  Pupils equal round and reactive, fundi not visualized, oral  mucosa unremarkable NECK:  No jugular venous distention, waveform within normal limits, carotid upstroke brisk and symmetric, no bruits, no thyromegaly LYMPHATICS:  No cervical, inguinal adenopathy LUNGS:  Clear to auscultation bilaterally BACK:  No CVA tenderness CHEST:  Unremarkable HEART:  PMI not displaced or sustained,S1 and S2 within normal limits, no S3, no S4, no clicks, no rubs, no murmurs ABD:  Flat, positive bowel sounds normal in frequency in pitch, no bruits, no rebound, no guarding, no midline pulsatile mass, no hepatomegaly, no splenomegaly EXT:  2 plus pulses throughout, no edema, no cyanosis no clubbing SKIN:  No rashes no nodules NEURO:  Cranial nerves II through XII grossly intact, motor grossly intact throughout PSYCH:  Cognitively intact, oriented to person place and time    EKG:  EKG is ordered today. The ekg ordered today demonstrates sinus rhythm, rate 68, axis within normal limits, intervals within normal limits, poor anterior R wave progression, no acute ST-T wave changes.   Recent Labs: 01/16/2019: BUN 19; Creatinine, Ser 0.73; Hemoglobin 12.5; Platelets 207.0; Potassium 3.8; Sodium 139; TSH 2.36    Lipid Panel    Component Value Date/Time   CHOL 182 05/29/2014 1038   TRIG 51 05/29/2014 1038   HDL 79 05/29/2014 1038   CHOLHDL 2.3 05/29/2014 1038   LDLCALC 93 05/29/2014 1038      Wt Readings from Last 3 Encounters:  01/31/19 145 lb (65.8 kg)  01/16/19 146 lb (66.2 kg)  08/29/18 146 lb (66.2 kg)      Other studies Reviewed: Additional studies/ records that were reviewed today include: Labs. Review of the above records demonstrates:  Please see elsewhere in the note.     ASSESSMENT AND PLAN:  CHEST PAIN: The patient has some discomfort that has typical and atypical features. I will bring the patient back for a POET (Plain Old Exercise Test). This will allow me to screen for obstructive coronary disease, risk stratify and very importantly provide a  prescription for exercise..  I would also like for her to get a coronary calcium score.  Further management will be based on these results.  PALPITATIONS: I am going to have her get a 2-week event monitor.  DYSLIPIDEMIA: Her LDL was 122 couple of years ago.  Goals of therapy will be based on the results of the testing above.   DIZZINESS:  This will be evaluated as above.   Current medicines are reviewed at length with the patient today.  The patient does not have concerns regarding medicines.  The following changes have been made:  no change  Labs/ tests ordered today include:   Orders Placed This Encounter  Procedures  . CT CARDIAC SCORING  . Exercise Tolerance Test  . Cardiac event monitor  . EKG 12-Lead     Disposition:   FU with me as needed.      Signed, Rollene Rotunda, MD  01/31/2019 5:58 PM    [facility]

## 2019-01-30 NOTE — Telephone Encounter (Signed)
Spoke with pt states that she is aware not to take both Mobic in addition to Naproxen daily. A copy of record release form was also mailed out to pt as requested

## 2019-01-31 ENCOUNTER — Encounter: Payer: Self-pay | Admitting: Cardiology

## 2019-01-31 ENCOUNTER — Other Ambulatory Visit: Payer: Self-pay

## 2019-01-31 ENCOUNTER — Ambulatory Visit (INDEPENDENT_AMBULATORY_CARE_PROVIDER_SITE_OTHER): Payer: BC Managed Care – PPO | Admitting: Cardiology

## 2019-01-31 ENCOUNTER — Telehealth: Payer: Self-pay | Admitting: Radiology

## 2019-01-31 VITALS — BP 130/78 | HR 68 | Temp 97.1°F | Ht 63.0 in | Wt 145.0 lb

## 2019-01-31 DIAGNOSIS — R002 Palpitations: Secondary | ICD-10-CM | POA: Diagnosis not present

## 2019-01-31 DIAGNOSIS — R072 Precordial pain: Secondary | ICD-10-CM | POA: Diagnosis not present

## 2019-01-31 DIAGNOSIS — R42 Dizziness and giddiness: Secondary | ICD-10-CM | POA: Diagnosis not present

## 2019-01-31 NOTE — Patient Instructions (Addendum)
Medication Instructions:  No Changes *If you need a refill on your cardiac medications before your next appointment, please call your pharmacy*  Lab Work: None  Testing/Procedures: Your physician has requested that you have an exercise tolerance test. For further information please visit [email or web address] Please also follow instruction sheet, as given. [address]  Coronary Calcium Score - [facility] hospital  Your physician has recommended that you wear an event monitor. Event monitors are medical devices that record the heart's electrical activity. Doctors most often [place] these monitors to diagnose arrhythmias. Arrhythmias are problems with the speed or rhythm of the heartbeat. The monitor is a small, portable device. You can wear one while you do your normal daily activities. This is usually used to diagnose what is causing palpitations/syncope (passing out).  Follow-Up: At [facility], you and your health needs are our priority.  As part of our continuing mission to provide you with exceptional heart care, we have created designated Provider Care Teams.  These Care Teams include your primary Cardiologist (physician) and Advanced Practice Providers (APPs -  Physician Assistants and Nurse Practitioners) who all work together to provide you with the care you need, when you need it.  FOLLOW UP AS NEEDED  Other Instructions [employer] Cardiac Event Monitor Instructions Your physician has requested you wear your cardiac event monitor for __14__ days. [employer] may call or text to confirm a shipping address. The monitor will be sent to a land address via UPS. [employer] will not ship a monitor to a PO BOX. It typically takes 3-5 days to receive your monitor after it has been enrolled. [employer] will assist with USPS tracking if your package is delayed. The telephone number for [employer] is [phone]. Once you have received your monitor, please review the  enclosed instructions. Instruction tutorials can also be viewed under help and settings on the enclosed cell phone. Your monitor has already been registered assigning a specific monitor serial # to you.  Applying the monitor Remove cell phone from case and turn it on. The cell phone works as IT consultant and needs to be within [employer] of you at all times. The cell phone will need to be charged on a daily basis. We recommend you plug the cell phone into the enclosed charger at your bedside table every night.  Monitor batteries: You will receive two monitor batteries labelled #1 and #2. These are your recorders. Plug battery #2 onto the second connection on the enclosed charger. Keep one battery on the charger at all times. This will keep the monitor battery deactivated. It will also keep it fully charged for when you need to switch your monitor batteries. A small light will be blinking on the battery emblem when it is charging. The light on the battery emblem will remain on when the battery is fully charged.  Open package of a Monitor strip. Insert battery #1 into black hood on strip and gently squeeze monitor battery onto connection as indicated in instruction booklet. Set aside while preparing skin.  Choose location for your strip, vertical or horizontal, as indicated in the instruction booklet. Shave to remove all hair from location. There cannot be any lotions, oils, powders, or colognes on skin where monitor is to be applied. Wipe skin clean with enclosed Saline wipe. Dry skin completely.  Peel paper labeled #1 off the back of the Monitor strip exposing the adhesive. Place the monitor on the chest in the vertical or horizontal position shown in the instruction  booklet. One arrow on the monitor strip must be pointing upward. Carefully remove paper labeled #2, attaching remainder of strip to your skin. Try not to create any folds or wrinkles in the strip as you apply it.  Firmly  press and release the circle in the center of the monitor battery. You will hear a small beep. This is turning the monitor battery on. The heart emblem on the monitor battery will light up every 5 seconds if the monitor battery in turned on and connected to the patient securely. Do not push and hold the circle down as this turns the monitor battery off. The cell phone will locate the monitor battery. A screen will appear on the cell phone checking the connection of your monitor strip. This may read poor connection initially but change to good connection within the next minute. Once your monitor accepts the connection you will hear a series of 3 beeps followed by a climbing crescendo of beeps. A screen will appear on the cell phone showing the two monitor strip placement options. Touch the picture that demonstrates where you applied the monitor strip.  Your monitor strip and battery are waterproof. You are able to shower, bathe, or swim with the monitor on. They just ask you do not submerge deeper than 3 feet underwater. We recommend removing the monitor if you are swimming in a lake, river, or ocean.  Your monitor battery will need to be switched to a fully charged monitor battery approximately once a week. The cell phone will alert you of an action which needs to be made.  On the cell phone, tap for details to reveal connection status, monitor battery status, and cell phone battery status. The green dots indicates your monitor is in good status. A red dot indicates there is something that needs your attention.  To record a symptom, click the circle on the monitor battery. In 30-60 seconds a list of symptoms will appear on the cell phone. Select your symptom and tap save. Your monitor will record a sustained or significant arrhythmia regardless of you clicking the button. Some patients do not feel the heart rhythm irregularities. [employer] will notify us of any serious or critical  events.  Refer to instruction booklet for instructions on switching batteries, changing strips, the Do not disturb or Pause features, or any additional questions.  Call [employer] at [phone], to confirm your monitor is transmitting and record your baseline. They will answer any questions you may have regarding the monitor instructions at that time.  Returning the monitor to [place] all equipment back into blue box. Peel off strip of paper to expose adhesive and close box securely. There is a prepaid UPS shipping label on this box. Drop in a UPS drop box, or at a UPS facility like [employer]. You may also contact [employer] to arrange UPS to pick up monitor package at your home.

## 2019-01-31 NOTE — Telephone Encounter (Signed)
Enrolled patient for a 14 day [employer] Event monitor to be mailed to patients home.  

## 2019-02-03 ENCOUNTER — Telehealth: Payer: Self-pay | Admitting: Family Medicine

## 2019-02-03 ENCOUNTER — Other Ambulatory Visit: Payer: Self-pay

## 2019-02-03 DIAGNOSIS — F4323 Adjustment disorder with mixed anxiety and depressed mood: Secondary | ICD-10-CM | POA: Diagnosis not present

## 2019-02-03 DIAGNOSIS — R42 Dizziness and giddiness: Secondary | ICD-10-CM

## 2019-02-03 NOTE — Telephone Encounter (Signed)
Patient's husband called in to say she needs a referral to Dr. Pollyann Kennedy at [facility] for vertigo.  Having a severe vertigo issue today.  Patient has had vertigo before.

## 2019-02-03 NOTE — Telephone Encounter (Signed)
Pt states that she has ENT Dr Pollyann Kennedy on [facility] previously (2017) for Vertigo and she would like to go back to the same office. Requesting a referral for ENT due to Vertigo

## 2019-02-03 NOTE — Telephone Encounter (Signed)
Pt's spouse, Lollie Sails, called in regards to the referral to the EMT ([address] [facility]) for his wife's vertigo. He states that the place states they have not received the referral. He was provided with their fax numbers  [phone] or [phone] and to contact the place [phone]. If needed pt can be contacted at [phone]

## 2019-02-03 NOTE — Telephone Encounter (Signed)
Pt request sent to Dr Salomon Fick for approval

## 2019-02-03 NOTE — Telephone Encounter (Signed)
Pt referral was Faxed to [facility], fax confirmation received, pt husband is aware

## 2019-02-03 NOTE — Telephone Encounter (Signed)
Ok

## 2019-02-03 NOTE — Telephone Encounter (Signed)
Pt notified through MyChart portal 

## 2019-02-03 NOTE — Telephone Encounter (Signed)
Spouse call and want dr.Banks to know Patricia Lloyd have vertigo.

## 2019-02-03 NOTE — Telephone Encounter (Signed)
Referral placed as requested.

## 2019-02-04 DIAGNOSIS — H8110 Benign paroxysmal vertigo, unspecified ear: Secondary | ICD-10-CM | POA: Diagnosis not present

## 2019-02-04 DIAGNOSIS — R42 Dizziness and giddiness: Secondary | ICD-10-CM | POA: Diagnosis not present

## 2019-02-06 DIAGNOSIS — R42 Dizziness and giddiness: Secondary | ICD-10-CM | POA: Diagnosis not present

## 2019-02-06 DIAGNOSIS — H8112 Benign paroxysmal vertigo, left ear: Secondary | ICD-10-CM | POA: Diagnosis not present

## 2019-02-11 DIAGNOSIS — F4323 Adjustment disorder with mixed anxiety and depressed mood: Secondary | ICD-10-CM | POA: Diagnosis not present

## 2019-02-13 ENCOUNTER — Ambulatory Visit (INDEPENDENT_AMBULATORY_CARE_PROVIDER_SITE_OTHER)
Admission: RE | Admit: 2019-02-13 | Discharge: 2019-02-13 | Disposition: A | Discharge status: Home / Self Care | Payer: Self-pay | Source: Ambulatory Visit | Attending: Cardiology | Admitting: Cardiology

## 2019-02-13 ENCOUNTER — Telehealth (HOSPITAL_COMMUNITY): Payer: Self-pay

## 2019-02-13 ENCOUNTER — Ambulatory Visit (INDEPENDENT_AMBULATORY_CARE_PROVIDER_SITE_OTHER): Payer: BC Managed Care – PPO

## 2019-02-13 ENCOUNTER — Other Ambulatory Visit: Payer: Self-pay

## 2019-02-13 DIAGNOSIS — R42 Dizziness and giddiness: Secondary | ICD-10-CM | POA: Diagnosis not present

## 2019-02-13 DIAGNOSIS — R002 Palpitations: Secondary | ICD-10-CM

## 2019-02-13 NOTE — Telephone Encounter (Signed)
Encounter complete. 

## 2019-02-14 ENCOUNTER — Other Ambulatory Visit (HOSPITAL_COMMUNITY)
Admission: RE | Admit: 2019-02-14 | Discharge: 2019-02-14 | Disposition: A | Discharge status: Home / Self Care | Payer: BC Managed Care – PPO | Source: Ambulatory Visit | Attending: Cardiology | Admitting: Cardiology

## 2019-02-14 ENCOUNTER — Other Ambulatory Visit (HOSPITAL_COMMUNITY): Payer: BC Managed Care – PPO

## 2019-02-14 DIAGNOSIS — Z01812 Encounter for preprocedural laboratory examination: Secondary | ICD-10-CM | POA: Diagnosis not present

## 2019-02-14 DIAGNOSIS — Z20822 Contact with and (suspected) exposure to covid-19: Secondary | ICD-10-CM | POA: Diagnosis not present

## 2019-02-14 LAB — SARS CORONAVIRUS 2 (TAT 6-24 HRS): SARS Coronavirus 2: NEGATIVE

## 2019-02-18 ENCOUNTER — Ambulatory Visit (HOSPITAL_COMMUNITY)
Admission: RE | Admit: 2019-02-18 | Discharge: 2019-02-18 | Disposition: A | Discharge status: Home / Self Care | Payer: BC Managed Care – PPO | Source: Ambulatory Visit | Attending: Internal Medicine | Admitting: Internal Medicine

## 2019-02-18 ENCOUNTER — Other Ambulatory Visit: Payer: Self-pay

## 2019-02-18 DIAGNOSIS — R42 Dizziness and giddiness: Secondary | ICD-10-CM | POA: Diagnosis not present

## 2019-02-18 DIAGNOSIS — R002 Palpitations: Secondary | ICD-10-CM | POA: Diagnosis not present

## 2019-02-18 LAB — EXERCISE TOLERANCE TEST
Estimated workload: 11 METS
Exercise duration (min): 9 min
Exercise duration (sec): 26 s
MPHR: 160 {beats}/min
Peak HR: 146 {beats}/min
Percent HR: 91 %
Rest HR: 61 {beats}/min

## 2019-02-25 DIAGNOSIS — F4323 Adjustment disorder with mixed anxiety and depressed mood: Secondary | ICD-10-CM | POA: Diagnosis not present

## 2019-03-05 DIAGNOSIS — F4323 Adjustment disorder with mixed anxiety and depressed mood: Secondary | ICD-10-CM | POA: Diagnosis not present

## 2019-03-09 ENCOUNTER — Other Ambulatory Visit: Payer: Self-pay

## 2019-03-09 ENCOUNTER — Ambulatory Visit: Payer: BC Managed Care – PPO | Attending: Internal Medicine

## 2019-03-09 DIAGNOSIS — Z23 Encounter for immunization: Secondary | ICD-10-CM | POA: Insufficient documentation

## 2019-03-25 DIAGNOSIS — F4323 Adjustment disorder with mixed anxiety and depressed mood: Secondary | ICD-10-CM | POA: Diagnosis not present

## 2019-04-01 ENCOUNTER — Ambulatory Visit: Payer: BC Managed Care – PPO | Attending: Internal Medicine

## 2019-04-01 DIAGNOSIS — Z23 Encounter for immunization: Secondary | ICD-10-CM

## 2019-04-01 NOTE — Progress Notes (Signed)
   Covid-19 Vaccination Clinic  Name:  KEILEIGH VAHEY    MRN: [id number] DOB: 31-Jul-1960  04/01/2019  Ms. Bowie was observed post Covid-19 immunization for 15 minutes without incident. She was provided with Vaccine Information Sheet and instruction to access the V-Safe system.   Ms. Noa was instructed to call 911 with any severe reactions post vaccine: Marland Kitchen Difficulty breathing  . Swelling of face and throat  . A fast heartbeat  . A bad rash all over body  . Dizziness and weakness   Immunizations Administered    Name Date Dose VIS Date Route   Pfizer COVID-19 Vaccine 04/01/2019 10:10 AM 0.3 mL 12/20/2018 Intramuscular   Manufacturer: [employer], [employer]   Lot: [id number]   NDC: [address]-5

## 2019-04-08 ENCOUNTER — Ambulatory Visit: Payer: BC Managed Care – PPO

## 2019-04-13 ENCOUNTER — Other Ambulatory Visit: Payer: Self-pay | Admitting: Family Medicine

## 2019-05-12 ENCOUNTER — Telehealth: Payer: Self-pay | Admitting: Family Medicine

## 2019-05-12 NOTE — Telephone Encounter (Signed)
Pt had to cancel her second shingles vaccine and wanted to know how much time she had to reschedule.

## 2019-05-12 NOTE — Telephone Encounter (Signed)
Left pt a detailed message to cal the office and reschedule her 2nd shingrix vaccine appointment before 06/16/2019

## 2019-05-15 ENCOUNTER — Ambulatory Visit: Payer: BC Managed Care – PPO

## 2019-05-19 DIAGNOSIS — M25571 Pain in right ankle and joints of right foot: Secondary | ICD-10-CM | POA: Diagnosis not present

## 2019-05-19 DIAGNOSIS — M6281 Muscle weakness (generalized): Secondary | ICD-10-CM | POA: Diagnosis not present

## 2019-05-19 DIAGNOSIS — M25671 Stiffness of right ankle, not elsewhere classified: Secondary | ICD-10-CM | POA: Diagnosis not present

## 2019-05-19 DIAGNOSIS — R262 Difficulty in walking, not elsewhere classified: Secondary | ICD-10-CM | POA: Diagnosis not present

## 2019-05-21 NOTE — Telephone Encounter (Signed)
Spoke with pt state  that she will call the office to scheduled her 2nd shingrix injection before 06/16/2019

## 2019-05-27 DIAGNOSIS — F4323 Adjustment disorder with mixed anxiety and depressed mood: Secondary | ICD-10-CM | POA: Diagnosis not present

## 2019-06-02 DIAGNOSIS — M25571 Pain in right ankle and joints of right foot: Secondary | ICD-10-CM | POA: Diagnosis not present

## 2019-06-02 DIAGNOSIS — M25671 Stiffness of right ankle, not elsewhere classified: Secondary | ICD-10-CM | POA: Diagnosis not present

## 2019-06-02 DIAGNOSIS — M6281 Muscle weakness (generalized): Secondary | ICD-10-CM | POA: Diagnosis not present

## 2019-06-02 DIAGNOSIS — R262 Difficulty in walking, not elsewhere classified: Secondary | ICD-10-CM | POA: Diagnosis not present

## 2019-06-10 DIAGNOSIS — Z01419 Encounter for gynecological examination (general) (routine) without abnormal findings: Secondary | ICD-10-CM | POA: Diagnosis not present

## 2019-06-10 DIAGNOSIS — Z79890 Hormone replacement therapy (postmenopausal): Secondary | ICD-10-CM | POA: Diagnosis not present

## 2019-06-10 DIAGNOSIS — Z6824 Body mass index (BMI) 24.0-24.9, adult: Secondary | ICD-10-CM | POA: Diagnosis not present

## 2019-06-10 DIAGNOSIS — N951 Menopausal and female climacteric states: Secondary | ICD-10-CM | POA: Diagnosis not present

## 2019-06-26 DIAGNOSIS — F4323 Adjustment disorder with mixed anxiety and depressed mood: Secondary | ICD-10-CM | POA: Diagnosis not present

## 2019-07-08 DIAGNOSIS — F4323 Adjustment disorder with mixed anxiety and depressed mood: Secondary | ICD-10-CM | POA: Diagnosis not present

## 2019-07-15 ENCOUNTER — Ambulatory Visit (INDEPENDENT_AMBULATORY_CARE_PROVIDER_SITE_OTHER): Payer: BC Managed Care – PPO | Admitting: Sports Medicine

## 2019-07-15 ENCOUNTER — Other Ambulatory Visit: Payer: Self-pay

## 2019-07-15 DIAGNOSIS — M25552 Pain in left hip: Secondary | ICD-10-CM

## 2019-07-15 NOTE — Progress Notes (Signed)
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 Palpation is normal with no tenderness over AC joint or bicipital groove. ROM is full in all planes. Rotator cuff strength normal throughout. No signs of impingement with negative Neer and Hawkin's tests, empty can. Speeds and Yergason's tests normal. No labral pathology noted with negative Obrien's, negative clunk and good stability. Normal scapular function observed. No painful arc and no drop arm sign. No apprehension sign  Left lateral hip No weakness on abduction or adduction Mild pain over greater trochanter Lateral oblique is TTP coming into iliac crest  No hip joint shows normal ROM and flexion bilat.

## 2019-07-15 NOTE — Assessment & Plan Note (Signed)
Given Hep to emphasize Lateral oblique rehab  Keep up pilates as that is keeping lateral hip strong  Change her carrying pattern  As she now relies on left arm  Reck if not resolving in 4 weeks

## 2019-07-29 DIAGNOSIS — F4323 Adjustment disorder with mixed anxiety and depressed mood: Secondary | ICD-10-CM | POA: Diagnosis not present

## 2019-08-05 DIAGNOSIS — F4323 Adjustment disorder with mixed anxiety and depressed mood: Secondary | ICD-10-CM | POA: Diagnosis not present

## 2019-08-19 DIAGNOSIS — F4323 Adjustment disorder with mixed anxiety and depressed mood: Secondary | ICD-10-CM | POA: Diagnosis not present

## 2019-08-26 DIAGNOSIS — F4323 Adjustment disorder with mixed anxiety and depressed mood: Secondary | ICD-10-CM | POA: Diagnosis not present

## 2019-09-12 ENCOUNTER — Ambulatory Visit (INDEPENDENT_AMBULATORY_CARE_PROVIDER_SITE_OTHER): Payer: BC Managed Care – PPO | Admitting: Family Medicine

## 2019-09-12 ENCOUNTER — Other Ambulatory Visit: Payer: Self-pay

## 2019-09-12 ENCOUNTER — Encounter: Payer: Self-pay | Admitting: Family Medicine

## 2019-09-12 VITALS — BP 120/80 | HR 63 | Temp 97.7°F | Wt 141.0 lb

## 2019-09-12 DIAGNOSIS — Z7189 Other specified counseling: Secondary | ICD-10-CM

## 2019-09-12 DIAGNOSIS — R0789 Other chest pain: Secondary | ICD-10-CM | POA: Diagnosis not present

## 2019-09-12 DIAGNOSIS — J069 Acute upper respiratory infection, unspecified: Secondary | ICD-10-CM | POA: Diagnosis not present

## 2019-09-12 NOTE — Progress Notes (Signed)
Subjective:    Patient ID: Patricia Lloyd, female    DOB: 1960/03/05, 59 y.o.   MRN: [id number]  No chief complaint on file.   HPI Patient was seen today for acute concern.  Pt with body aches, HA, brain fog, and no appetite off and on x 6 days.  Also endorses ear pain/pressure, facial pain/pressure, cough with occasional clear phlegm,, emesis x 1, and nausea x 3 days.  Pt notes symptoms started after returning from [place].  Tried Tylenol which did not help.  Denies sore throat, loss of taste or smell.  Patient completed both doses of Pfizer COVID-19 vaccine in March.  Past Medical History:  Diagnosis Date  . Fibromyalgia   . H/O fatigue   . Insomnia   . PONV (postoperative nausea and vomiting)    severe    No Known Allergies  ROS General: Denies fever, chills, night sweats, changes in weight +body aches, brain fog, decreased appetite HEENT: Denies changes in vision, rhinorrhea, sore throat  +headaches, ear pain/pressure, facial pain CV: Denies CP, palpitations, SOB, orthopnea Pulm: Denies SOB, wheezing  +cough GI: Denies abdominal pain, diarrhea, constipation  +n/v GU: Denies dysuria, hematuria, frequency, vaginal discharge Msk: Denies muscle cramps, joint pains Neuro: Denies weakness, numbness, tingling Skin: Denies rashes, bruising Psych: Denies depression, anxiety, hallucinations     Objective:    Blood pressure 120/80, pulse 63, temperature 97.7 F (36.5 C), temperature source Oral, weight 141 lb (64 kg), last menstrual period 12/12/2012, SpO2 98 %.  Gen. Pleasant, well-nourished, appears flush, in no distress, normal affect   HEENT: [place]/AT, face symmetric, conjunctiva clear, no scleral icterus, PERRLA, EOMI, nares patent without drainage, pharynx without erythema or exudate.  TMs normal b/l. Lungs: no accessory muscle use, CTAB, no wheezes or rales Cardiovascular: RRR, no m/r/g, no peripheral edema Abdomen: BS present, soft, NT/ND Musculoskeletal: No deformities,  no cyanosis or clubbing, normal tone Neuro:  A&Ox3, CN II-XII intact, normal gait Skin:  Warm, no lesions/ rash   Wt Readings from Last 3 Encounters:  07/15/19 140 lb (63.5 kg)  01/31/19 145 lb (65.8 kg)  01/16/19 146 lb (66.2 kg)    Lab Results  Component Value Date   WBC 6.7 01/16/2019   HGB 12.5 01/16/2019   HCT 37.8 01/16/2019   PLT 207.0 01/16/2019   GLUCOSE 83 01/16/2019   CHOL 182 05/29/2014   TRIG 51 05/29/2014   HDL 79 05/29/2014   LDLCALC 93 05/29/2014   ALT 16 05/29/2014   AST 22 05/29/2014   NA 139 01/16/2019   K 3.8 01/16/2019   CL 104 01/16/2019   CREATININE 0.73 01/16/2019   BUN 19 01/16/2019   CO2 28 01/16/2019   TSH 2.36 01/16/2019    Assessment/Plan:  Viral URI with cough  -discussed possible causes including acute nasopharyngitis, influenza, also consider COVID-19. -Continue supportive care -Patient encouraged to get Covid testing today -Self quarantine encouraged -Given precautions - Plan: CBC with Differential/Platelet, CMP with eGFR([facility]), CMP with eGFR([facility]), CBC with Differential/Platelet  Chest pressure  -discussed possible causes including anxiety, pneumonia-though lungs clear on exam, GERD, consider ischemia -will obtain labs.  -discussed having COVID testing done -given precautions - Plan: CMP with eGFR([facility]), Troponin I, Troponin I, CMP with eGFR([facility])  Educated about COVID-19 virus infection -Gust signs and symptoms of COVID-19 virus infection -Discussed various testing options -Patient advised on local testing sites.  F/u prn  Grier Mitts, MD

## 2019-09-12 NOTE — Patient Instructions (Addendum)
Upper Respiratory Infection, Adult An upper respiratory infection (URI) is a common viral infection of the nose, throat, and upper air passages that lead to the lungs. The most common type of URI is the common cold. URIs usually get better on their own, without medical treatment. What are the causes? A URI is caused by a virus. You may catch a virus by:  Breathing in droplets from an infected person's cough or sneeze.  Touching something that has been exposed to the virus (contaminated) and then touching your mouth, nose, or eyes. What increases the risk? You are more likely to get a URI if:  You are very young or very old.  It is autumn or winter.  You have close contact with others, such as at a daycare, school, or health care facility.  You smoke.  You have long-term (chronic) heart or lung disease.  You have a weakened disease-fighting (immune) system.  You have nasal allergies or asthma.  You are experiencing a lot of stress.  You work in an area that has poor air circulation.  You have poor nutrition. What are the signs or symptoms? A URI usually involves some of the following symptoms:  Runny or stuffy (congested) nose.  Sneezing.  Cough.  Sore throat.  Headache.  Fatigue.  Fever.  Loss of appetite.  Pain in your forehead, behind your eyes, and over your cheekbones (sinus pain).  Muscle aches.  Redness or irritation of the eyes.  Pressure in the ears or face. How is this diagnosed? This condition may be diagnosed based on your medical history and symptoms, and a physical exam. Your health care provider may use a cotton swab to take a mucus sample from your nose (nasal swab). This sample can be tested to determine what virus is causing the illness. How is this treated? URIs usually get better on their own within 7-10 days. You can take steps at home to relieve your symptoms. Medicines cannot cure URIs, but your health care provider may recommend  certain medicines to help relieve symptoms, such as:  Over-the-counter cold medicines.  Cough suppressants. Coughing is a type of defense against infection that helps to clear the respiratory system, so take these medicines only as recommended by your health care provider.  Fever-reducing medicines. Follow these instructions at home: Activity  Rest as needed.  If you have a fever, stay home from work or school until your fever is gone or until your health care provider says you are no longer contagious. Your health care provider may have you wear a face mask to prevent your infection from spreading. Relieving symptoms  Gargle with a salt-water mixture 3-4 times a day or as needed. To make a salt-water mixture, completely dissolve -1 tsp of salt in 1 cup of warm water.  Use a cool-mist humidifier to add moisture to the air. This can help you breathe more easily. Eating and drinking   Drink enough fluid to keep your urine pale yellow.  Eat soups and other clear broths. General instructions   Take over-the-counter and prescription medicines only as told by your health care provider. These include cold medicines, fever reducers, and cough suppressants.  Do not use any products that contain nicotine or tobacco, such as cigarettes and e-cigarettes. If you need help quitting, ask your health care provider.  Stay away from secondhand smoke.  Stay up to date on all immunizations, including the yearly (annual) flu vaccine.  Keep all follow-up visits as told by your health   care provider. This is important. How to prevent the spread of infection to others   URIs can be passed from person to person (are contagious). To prevent the infection from spreading: ? Wash your hands often with soap and water. If soap and water are not available, use hand sanitizer. ? Avoid touching your mouth, face, eyes, or nose. ? Cough or sneeze into a tissue or your sleeve or elbow instead of into your hand  or into the air. Contact a health care provider if:  You are getting worse instead of better.  You have a fever or chills.  Your mucus is brown or red.  You have yellow or brown discharge coming from your nose.  You have pain in your face, especially when you bend forward.  You have swollen neck glands.  You have pain while swallowing.  You have white areas in the back of your throat. Get help right away if:  You have shortness of breath that gets worse.  You have severe or persistent: ? Headache. ? Ear pain. ? Sinus pain. ? Chest pain.  You have chronic lung disease along with any of the following: ? Wheezing. ? Prolonged cough. ? Coughing up blood. ? A change in your usual mucus.  You have a stiff neck.  You have changes in your: ? Vision. ? Hearing. ? Thinking. ? Mood. Summary  An upper respiratory infection (URI) is a common infection of the nose, throat, and upper air passages that lead to the lungs.  A URI is caused by a virus.  URIs usually get better on their own within 7-10 days.  Medicines cannot cure URIs, but your health care provider may recommend certain medicines to help relieve symptoms. This information is not intended to replace advice given to you by your health care provider. Make sure you discuss any questions you have with your health care provider. Document Revised: 01/03/2018 Document Reviewed: 08/11/2016 Elsevier Patient Education  2020 [employer]  Gastroesophageal Reflux Disease, Adult Gastroesophageal reflux (GER) happens when acid from the stomach flows up into the tube that connects the mouth and the stomach (esophagus). Normally, food travels down the esophagus and stays in the stomach to be digested. With GER, food and stomach acid sometimes move back up into the esophagus. You may have a disease called gastroesophageal reflux disease (GERD) if the reflux:  Happens often.  Causes frequent or very bad symptoms.  Causes  problems such as damage to the esophagus. When this happens, the esophagus becomes sore and swollen (inflamed). Over time, GERD can make small holes (ulcers) in the lining of the esophagus. What are the causes? This condition is caused by a problem with the muscle between the esophagus and the stomach. When this muscle is weak or not normal, it does not close properly to keep food and acid from coming back up from the stomach. The muscle can be weak because of:  Tobacco use.  Pregnancy.  Having a certain type of hernia (hiatal hernia).  Alcohol use.  Certain foods and drinks, such as coffee, chocolate, onions, and peppermint. What increases the risk? You are more likely to develop this condition if you:  Are overweight.  Have a disease that affects your connective tissue.  Use NSAID medicines. What are the signs or symptoms? Symptoms of this condition include:  Heartburn.  Difficult or painful swallowing.  The feeling of having a lump in the throat.  A bitter taste in the mouth.  Bad breath.  Having  a lot of saliva.  Having an upset or bloated stomach.  Belching.  Chest pain. Different conditions can cause chest pain. Make sure you see your doctor if you have chest pain.  Shortness of breath or noisy breathing (wheezing).  Ongoing (chronic) cough or a cough at night.  Wearing away of the surface of teeth (tooth enamel).  Weight loss. How is this treated? Treatment will depend on how bad your symptoms are. Your doctor may suggest:  Changes to your diet.  Medicine.  Surgery. Follow these instructions at home: Eating and drinking   Follow a diet as told by your doctor. You may need to avoid foods and drinks such as: ? Coffee and tea (with or without caffeine). ? Drinks that contain alcohol. ? Energy drinks and sports drinks. ? Bubbly (carbonated) drinks or sodas. ? Chocolate and cocoa. ? Peppermint and mint flavorings. ? Garlic and  onions. ? Horseradish. ? Spicy and acidic foods. These include peppers, chili powder, curry powder, vinegar, hot sauces, and BBQ sauce. ? Citrus fruit juices and citrus fruits, such as oranges, lemons, and limes. ? Tomato-based foods. These include red sauce, chili, salsa, and pizza with red sauce. ? Fried and fatty foods. These include donuts, french fries, potato chips, and high-fat dressings. ? High-fat meats. These include hot dogs, rib eye steak, sausage, ham, and bacon. ? High-fat dairy items, such as whole milk, butter, and cream cheese.  Eat small meals often. Avoid eating large meals.  Avoid drinking large amounts of liquid with your meals.  Avoid eating meals during the 2-3 hours before bedtime.  Avoid lying down right after you eat.  Do not exercise right after you eat. Lifestyle   Do not use any products that contain nicotine or tobacco. These include cigarettes, e-cigarettes, and chewing tobacco. If you need help quitting, ask your doctor.  Try to lower your stress. If you need help doing this, ask your doctor.  If you are overweight, lose an amount of weight that is healthy for you. Ask your doctor about a safe weight loss goal. General instructions  Pay attention to any changes in your symptoms.  Take over-the-counter and prescription medicines only as told by your doctor. Do not take aspirin, ibuprofen, or other NSAIDs unless your doctor says it is okay.  Wear loose clothes. Do not wear anything tight around your waist.  Raise (elevate) the head of your bed about 6 inches (15 cm).  Avoid bending over if this makes your symptoms worse.  Keep all follow-up visits as told by your doctor. This is important. Contact a doctor if:  You have new symptoms.  You lose weight and you do not know why.  You have trouble swallowing or it hurts to swallow.  You have wheezing or a cough that keeps happening.  Your symptoms do not get better with treatment.  You have  a hoarse voice. Get help right away if:  You have pain in your arms, neck, jaw, teeth, or back.  You feel sweaty, dizzy, or light-headed.  You have chest pain or shortness of breath.  You throw up (vomit) and your throw-up looks like blood or coffee grounds.  You pass out (faint).  Your poop (stool) is bloody or black.  You cannot swallow, drink, or eat. Summary  If a person has gastroesophageal reflux disease (GERD), food and stomach acid move back up into the esophagus and cause symptoms or problems such as damage to the esophagus.  Treatment will depend on  how bad your symptoms are.  Follow a diet as told by your doctor.  Take all medicines only as told by your doctor. This information is not intended to replace advice given to you by your health care provider. Make sure you discuss any questions you have with your health care provider. Document Revised: 07/04/2017 Document Reviewed: 07/04/2017 Elsevier Patient Education  2020 [employer].  Food Choices for Gastroesophageal Reflux Disease, Adult When you have gastroesophageal reflux disease (GERD), the foods you eat and your eating habits are very important. Choosing the right foods can help ease your discomfort. Think about working with a nutrition specialist (dietitian) to help you make good choices. What are tips for following this plan?  Meals  Choose healthy foods that are low in fat, such as fruits, vegetables, whole grains, low-fat dairy products, and lean meat, fish, and poultry.  Eat small meals often instead of 3 large meals a day. Eat your meals slowly, and in a place where you are relaxed. Avoid bending over or lying down until 2-3 hours after eating.  Avoid eating meals 2-3 hours before bed.  Avoid drinking a lot of liquid with meals.  Cook foods using methods other than frying. Bake, grill, or broil food instead.  Avoid or limit: ? Chocolate. ? Peppermint or spearmint. ? Alcohol. ? Pepper. ? Black  and decaffeinated coffee. ? Black and decaffeinated tea. ? Bubbly (carbonated) soft drinks. ? Caffeinated energy drinks and soft drinks.  Limit high-fat foods such as: ? Fatty meat or fried foods. ? Whole milk, cream, butter, or ice cream. ? Nuts and nut butters. ? Pastries, donuts, and sweets made with butter or shortening.  Avoid foods that cause symptoms. These foods may be different for everyone. Common foods that cause symptoms include: ? Tomatoes. ? Oranges, lemons, and limes. ? Peppers. ? Spicy food. ? Onions and garlic. ? Vinegar. Lifestyle  Maintain a healthy weight. Ask your doctor what weight is healthy for you. If you need to lose weight, work with your doctor to do so safely.  Exercise for at least 30 minutes for 5 or more days each week, or as told by your doctor.  Wear loose-fitting clothes.  Do not smoke. If you need help quitting, ask your doctor.  Sleep with the head of your bed higher than your feet. Use a wedge under the mattress or blocks under the bed frame to raise the head of the bed. Summary  When you have gastroesophageal reflux disease (GERD), food and lifestyle choices are very important in easing your symptoms.  Eat small meals often instead of 3 large meals a day. Eat your meals slowly, and in a place where you are relaxed.  Limit high-fat foods such as fatty meat or fried foods.  Avoid bending over or lying down until 2-3 hours after eating.  Avoid peppermint and spearmint, caffeine, alcohol, and chocolate. This information is not intended to replace advice given to you by your health care provider. Make sure you discuss any questions you have with your health care provider. Document Revised: 04/18/2018 Document Reviewed: 02/01/2016 Elsevier Patient Education  2020 [employer] Inc.  Angina  Angina is very bad discomfort or pain in the chest, neck, arm, jaw, or back. The discomfort is caused by a lack of blood in the middle layer of the heart  wall (myocardium). What are the causes? This condition is caused by a buildup of fat and cholesterol (plaque) in your arteries (atherosclerosis). This buildup narrows the arteries and  makes it hard for blood to flow. What increases the risk? You are more likely to develop this condition if:  You have high levels of cholesterol in your blood.  You have high blood pressure (hypertension).  You have diabetes.  You have a family history of heart disease.  You are not active, or you do not exercise enough.  You feel sad (depressed).  You have been treated with high energy rays (radiation) on the left side of your chest. Other risk factors are:  Using tobacco.  Being very overweight (obese).  Eating a diet high in unhealthy fats (saturated fats).  Having stress, or being exposed to things that cause stress.  Using drugs, such as cocaine. Women have a greater risk for angina if:  They are older than 5555.  They have stopped having their period (are in postmenopause). What are the signs or symptoms? Common symptoms of this condition in both men and women may include:  Chest pain, which may: ? Feel like a crushing or squeezing in the chest. ? Feel like a tightness, pressure, fullness, or heaviness in the chest. ? Last for more than a few minutes at a time. ? Stop and come back (recur) after a few minutes.  Pain in the neck, arm, jaw, or back.  Heartburn or upset stomach (indigestion) for no reason.  Being short of breath.  Feeling sick to your stomach (nauseous).  Sudden cold sweats. Women and people with diabetes may have other symptoms that are not usual, such as feeling:  Tired (fatigue).  Worried or nervous (anxious) for no reason.  Weak for no reason.  Dizzy or passing out (fainting). How is this treated? This condition may be treated with:  Medicines. These are given to: ? Prevent blood clots. ? Prevent heart attack. ? Relax blood vessels and improve  blood flow to the heart (nitrates). ? Reduce blood pressure. ? Improve the pumping action of the heart. ? Reduce fat and cholesterol in the blood.  A procedure to widen a narrowed or blocked artery in the heart (angioplasty).  Surgery to allow blood to go around a blocked artery (coronary artery bypass surgery). Follow these instructions at home: Medicines  Take over-the-counter and prescription medicines only as told by your doctor.  Do not take these medicines unless your doctor says that you can: ? NSAIDs. These include:  Ibuprofen.  Naproxen. ? Vitamin supplements that have vitamin A, vitamin E, or both. ? Hormone therapy that contains estrogen with or without progestin. Eating and drinking   Eat a heart-healthy diet that includes: ? Lots of fresh fruits and vegetables. ? Whole grains. ? Low-fat (lean) protein. ? Low-fat dairy products.  Follow instructions from your doctor about what you cannot eat or drink. Activity  Follow an exercise program that your doctor tells you.  Talk with your doctor about joining a program to help improve the health of your heart (cardiac rehab).  When you feel tired, take a break. Plan breaks if you know you are going to feel tired. Lifestyle   Do not use any products that contain nicotine or tobacco. This includes cigarettes, e-cigarettes, and chewing tobacco. If you need help quitting, ask your doctor.  If your doctor says you can drink alcohol: ? Limit how much you use to:  0-1 drink a day for women who are not pregnant.  0-2 drinks a day for men. ? Be aware of how much alcohol is in your drink. In the [place] one drink equals:  One 12 oz bottle of beer (355 mL).  One 5 oz glass of wine (148 mL).  One 1 oz glass of hard liquor (44 mL). General instructions  Stay at a healthy weight. If your doctor tells you to do so, work with him or her to lose weight.  Learn to deal with stress. If you need help, ask your  doctor.  Keep your vaccines up to date. Get a flu shot every year.  Talk with your doctor if you feel sad. Take a screening test to see if you are at risk for depression.  Work with your doctor to manage any other health problems that you have. These may include diabetes or high blood pressure.  Keep all follow-up visits as told by your doctor. This is important. Get help right away if:  You have pain in your chest, neck, arm, jaw, or back, and the pain: ? Lasts more than a few minutes. ? Comes back. ? Does not get better after you take medicine under your tongue (sublingual nitroglycerin). ? Keeps getting worse. ? Comes more often.  You have any of these problems for no reason: ? Sweating a lot. ? Heartburn or upset stomach. ? Shortness of breath. ? Trouble breathing. ? Feeling sick to your stomach. ? Throwing up (vomiting). ? Feeling more tired than normal. ? Feeling nervous or worrying more than normal. ? Weakness.  You are suddenly dizzy or light-headed.  You pass out. These symptoms may be an emergency. Do not wait to see if the symptoms will go away. Get medical help right away. Call your local emergency services (911 in the [place] Do not drive yourself to the hospital. Summary  Angina is very bad discomfort or pain in the chest, neck, arm, neck, or back.  Symptoms include chest pain, heartburn or upset stomach for no reason, and shortness of breath.  Women or people with diabetes may have symptoms that are not usual, such as feeling nervous or worried for no reason, weak for no reason, or tired.  Take all medicines only as told by your doctor.  You should eat a heart-healthy diet and follow an exercise program. This information is not intended to replace advice given to you by your health care provider. Make sure you discuss any questions you have with your health care provider. Document Revised: 08/13/2017 Document Reviewed: 08/13/2017 Elsevier Patient Education   2020 [employer].

## 2019-09-13 LAB — COMPLETE METABOLIC PANEL WITH GFR
AG Ratio: 1.4 (calc) (ref 1.0–2.5)
ALT: 15 U/L (ref 6–29)
AST: 21 U/L (ref 10–35)
Albumin: 4.1 g/dL (ref 3.6–5.1)
Alkaline phosphatase (APISO): 45 U/L (ref 37–153)
BUN: 21 mg/dL (ref 7–25)
CO2: 26 mmol/L (ref 20–32)
Calcium: 8.9 mg/dL (ref 8.6–10.4)
Chloride: 106 mmol/L (ref 98–110)
Creat: 0.83 mg/dL (ref 0.50–0.99)
GFR, Est African American: 88 mL/min/{1.73_m2} (ref >=60)
GFR, Est Non African American: 76 mL/min/{1.73_m2} (ref >=60)
Globulin: 2.9 g/dL (calc) (ref 1.9–3.7)
Glucose, Bld: 86 mg/dL (ref 65–99)
Potassium: 4.3 mmol/L (ref 3.5–5.3)
Sodium: 139 mmol/L (ref 135–146)
Total Bilirubin: 0.3 mg/dL (ref 0.2–1.2)
Total Protein: 7 g/dL (ref 6.1–8.1)

## 2019-09-13 LAB — CBC WITH DIFFERENTIAL/PLATELET
Absolute Monocytes: 338 cells/uL (ref 200–950)
Basophils Absolute: 52 cells/uL (ref 0–200)
Basophils Relative: 1 %
Eosinophils Absolute: 120 cells/uL (ref 15–500)
Eosinophils Relative: 2.3 %
HCT: 38.9 % (ref 35.0–45.0)
Hemoglobin: 12.7 g/dL (ref 11.7–15.5)
Lymphs Abs: 2049 cells/uL (ref 850–3900)
MCH: 29.7 pg (ref 27.0–33.0)
MCHC: 32.6 g/dL (ref 32.0–36.0)
MCV: 91.1 fL (ref 80.0–100.0)
MPV: 10.2 fL (ref 7.5–12.5)
Monocytes Relative: 6.5 %
Neutro Abs: 2642 cells/uL (ref 1500–7800)
Neutrophils Relative %: 50.8 %
Platelets: 239 10*3/uL (ref 140–400)
RBC: 4.27 10*6/uL (ref 3.80–5.10)
RDW: 12.5 % (ref 11.0–15.0)
Total Lymphocyte: 39.4 %
WBC: 5.2 10*3/uL (ref 3.8–10.8)

## 2019-09-13 LAB — TROPONIN I: Troponin I: <3 ng/L (ref <=47)

## 2019-09-23 ENCOUNTER — Ambulatory Visit: Payer: BC Managed Care – PPO | Admitting: Sports Medicine

## 2019-09-23 ENCOUNTER — Other Ambulatory Visit: Payer: Self-pay

## 2019-09-23 VITALS — BP 108/84 | Ht 64.0 in | Wt 135.0 lb

## 2019-09-23 DIAGNOSIS — M25552 Pain in left hip: Secondary | ICD-10-CM | POA: Diagnosis not present

## 2019-09-23 MED ORDER — CYCLOBENZAPRINE HCL 10 MG PO TABS: 10.0000 mg | ORAL_TABLET | Freq: Every evening | ORAL | 0 refills | Status: DC | PRN

## 2019-09-23 MED ORDER — MELOXICAM 15 MG PO TABS: ORAL_TABLET | ORAL | 1 refills | Status: DC

## 2019-09-23 NOTE — Progress Notes (Signed)
SUBJECTIVE:   CHIEF COMPLAINT / HPI:   Left oblique strain Patricia Lloyd is a very pleasant 59 year old female who presents for follow-up due to her left oblique strain.  She states she is still having pain in the home exercises that she was given do help but only provide very temporary relief.  She says the pain is mainly beginning under the armpit falling down the midaxillary line goes all the way down to the hip.  Certain movements exacerbate it however for the most part she does not feel like she has made much improvement.  He continues to do Pilates and thinks that it does feel good but the pain is like a grinding dull pain that simply has not gotten any better.  She does state that the swelling has improved.  Left hip pain She is also having pain in her left hip which is exacerbated by movement of her leg in internal and external rotation radiates outside lateral hip and moves into the groin area.  She has had this ongoing for several months and also states that she feels like her range of motion of that leg and in the hip joint specifically is reduced.  She has had no falls and states she has had no weakness and continues to do exercises to help strengthen and improve her mobility of the left hip however she does have pain on many days with activity.  PERTINENT  PMH / PSH: History of fibromyalgia  ROS Sleep is inconsistent Awakens with pain at lateral left hip or along her side  OBJECTIVE:   BP 108/84   Ht 5\' 4"  (1.626 m)   Wt 135 lb (61.2 kg)   LMP 12/12/2012   BMI 23.17 kg/m   Chest Wall Tenderness to palpation along the left mid axillary line over the rib cage. Hip, Left: No obvious rash, erythema, ecchymosis, or edema. Limited active ROM full in all directions; Strength 5/5 in IR/ER/Flex/Ext/Abd/Add. Pelvic alignment unremarkable to inspection and palpation. Standing hip rotation and gait without unsteadiness. Greater trochanter without tenderness to palpation. No  tenderness over piriformis. No SI joint tenderness and normal minimal SI movement. Special Test:    - FABER test: Equivical   - Passive Log Roll test:   Note while ROM is normal the left hip rotation is less than RT.  The FABER on left is tight and less flexible on RT   ASSESSMENT/PLAN:   Lateral pain of left hip Given patient has left hip pain exacerbated by internal and external rotation limited range of motion and has that pain radiating to the groin area that is reproducible on exam today but never had x-rays we will start with AP of the pelvis today to assess for presence of primary hip osteoarthritis.  If her x-ray does show significant osteoarthritis we will begin with conservative measures first however if she does not respond to these we may refer her to or so for discussion of total hip replacement.   Left Oblique strain Given she has not made much improvement in her daily exercises we will refill her meloxicam and start her on Flexeril once daily at nighttime.  We will have her follow-up after we have obtained x-rays and at that point see if the medication and continued home exercises helping.  However if they are not we could consider proceeding with formal physical therapy but we wanted to see what the left hip x-rays showed first to see if possibly this left oblique strain  with compensatory from left hip OA. - Proceed with formal physical therapy once we have a more definitive diagnosis as to whether this is in response to the left hip OA and a compensatory mechanism or if she simply had primary strain of her left oblique and will benefit from core rehab   Patricia Harman, DO PGY-4, Sports Medicine Fellow [facility]  I observed and examined the patient with the [facility] resident and agree with assessment and plan.  Note reviewed and modified by me.  I am not sure that she has significant OA but believe we need to check XR.  In any case ther motion is good enough that we  would offer several strategies for conservative care before considering any surgical intervention. Patricia Big, MD  XR pending

## 2019-09-23 NOTE — Assessment & Plan Note (Signed)
Given patient has left hip pain exacerbated by internal and external rotation limited range of motion and has that pain radiating to the groin area that is reproducible on exam today but never had x-rays we will start with AP of the pelvis today to assess for presence of primary hip osteoarthritis.  If her x-ray does show significant osteoarthritis we will begin with conservative measures first however if she does not respond to these we may refer her to or so for discussion of total hip replacement.

## 2019-09-23 NOTE — Patient Instructions (Signed)
It was great to meet you today! Thank you for letting me participate in your care!  Today, we discussed your left sided pain and left hip pain and we will obtain an x-ray to further evaluate your pain. We also have refilled your meloxicam and want you to start taking Flexeril at night time daily.  We will call you after you have the x-rays and discuss how to proceed.  Be well, Jules Schick, DO PGY-4, Sports Medicine Fellow [facility]

## 2019-09-25 DIAGNOSIS — F4323 Adjustment disorder with mixed anxiety and depressed mood: Secondary | ICD-10-CM | POA: Diagnosis not present

## 2019-09-26 ENCOUNTER — Ambulatory Visit
Admission: RE | Admit: 2019-09-26 | Discharge: 2019-09-26 | Disposition: A | Discharge status: Home / Self Care | Payer: BC Managed Care – PPO | Source: Ambulatory Visit | Attending: Sports Medicine | Admitting: Sports Medicine

## 2019-09-26 ENCOUNTER — Other Ambulatory Visit: Payer: Self-pay

## 2019-09-26 DIAGNOSIS — M25552 Pain in left hip: Secondary | ICD-10-CM

## 2019-09-26 DIAGNOSIS — M25751 Osteophyte, right hip: Secondary | ICD-10-CM | POA: Diagnosis not present

## 2019-09-26 DIAGNOSIS — M25752 Osteophyte, left hip: Secondary | ICD-10-CM | POA: Diagnosis not present

## 2019-10-11 ENCOUNTER — Other Ambulatory Visit: Payer: Self-pay | Admitting: Family Medicine

## 2019-10-14 NOTE — Telephone Encounter (Signed)
Pt request for refill on Meloxicam 15 mg, Please advise

## 2019-10-17 DIAGNOSIS — F4323 Adjustment disorder with mixed anxiety and depressed mood: Secondary | ICD-10-CM | POA: Diagnosis not present

## 2019-10-21 ENCOUNTER — Other Ambulatory Visit: Payer: Self-pay

## 2019-10-21 ENCOUNTER — Ambulatory Visit (INDEPENDENT_AMBULATORY_CARE_PROVIDER_SITE_OTHER): Payer: BC Managed Care – PPO | Admitting: Sports Medicine

## 2019-10-21 VITALS — BP 118/86 | Ht 64.0 in | Wt 136.0 lb

## 2019-10-21 DIAGNOSIS — M25552 Pain in left hip: Secondary | ICD-10-CM

## 2019-10-21 NOTE — Progress Notes (Signed)
Patricia Lloyd is a 59 y.o. female who presents to [facility] today for the following:  Left Hip Pain Continues to worsen Will be standing at work and have shooting pain in her left hip Also worsens with driving and has been driving with a tennis ball that helps her pain some Pain is in the lateral portion of her left hip and radiates to her buttocks States that she has a tender spot on her buttocks and can feel a "tight cord" there She has been waking from her sleep with the pain, does not matter what side she is sleeping on Has been taking flexeril for a week which helps her sleep, but doesn't help the pain Mobic hasn't improved the pain Does PT Pilates  Left Oblique Pain Continues to have pain Feels the pain with stretching of left oblique, palpation, wraps around her back Has previously been told a while ago that "cartilage between her ribs came out" and thinks that this feels similar to when this occurred Has been doing PT Pilates and not having improvement in her pain Able to reach overhead without pain  PMH reviewed.  ROS as above. Medications reviewed.  Exam:  BP 118/86   Ht 5\' 4"  (1.626 m)   Wt 136 lb (61.7 kg)   LMP 12/12/2012   BMI 23.34 kg/m   [facility] 09/23/2019  Frequency of aerobic exercise (# of days/week) 1  Average time in minutes 45  Frequency of strengthening activities (# of days/week) 2    Gen: Well NAD MSK:  Left Hip:  - Inspection: No gross deformity, no swelling, erythema, or ecchymosis b/l - Palpation: No TTP over greater trochanter bilaterally, TTP left piriformis region, TTP left IT band. - ROM: Pain with extreme of flexion of left hip, mildly decreased internal rotation on left compared to right, left about 50 degrees, right about 60 degrees.  Otherwise range of motion intact and symmetrical bilaterally. - Strength: Normal strength bilaterally - Neuro/vasc: NV intact distally bilaterally - Special Tests: Equivocal  FABER on left, positive FADIR on left.  Negative FABER and FADIR on right.  Positive Ober's on left.  Positive logroll test on left.  Chest wall: Tenderness palpation along left mid axillary line over rib cage, tenderness palpation over angle of left 10th rib Pain with right side bending felt on left lateral chest wall No pain with flexion of abdominal muscles   DG Pelvis 1-2 Views  Result Date: 09/28/2019 CLINICAL DATA:  Left hip pain EXAM: PELVIS - 1-2 VIEW COMPARISON:  11/22/2015 FINDINGS: There is no evidence of pelvic fracture or diastasis. Minimal, symmetric bilateral superior hip joint space loss and acetabular osteophytosis, unchanged compared to prior examination. No pelvic bone lesions are seen. IMPRESSION: No fracture or dislocation of the pelvis. Minimal, symmetric bilateral superior hip joint space loss and acetabular osteophytosis, unchanged compared to prior examination. Electronically Signed   By: 11/24/2015 M.D.   On: 09/28/2019 10:35     Assessment and Plan: 1) Lateral pain of left hip Patient's pain seems to be worse with hip testing with true positives for impingement and piriformis syndrome.  She does not have pain with flexion of obliques, which is somewhat odd.  She does have pain with stretching of obliques.  Consider referred pain from her hip causing pain into her left side and back.  Given that she has not had improvement with physical therapy and x-rays did not show significant osteoarthritis, we will move forward with  a left hip MRI to further assess for possible tear in musculature.  Will follow up with her after MRI.  She can continue to take Flexeril as needed to help her sleep.   Luis Abed, D.O.  PGY-3 Family Medicine  10/21/2019 4:52 PM   I observed and examined the patient with the resident and agree with assessment and plan.  Note reviewed and modified by me. Sterling Big, MD

## 2019-10-21 NOTE — Patient Instructions (Signed)
We will schedule you an MRI of your left hip to further evaluate your pain.    Continue to take flexeril if it helps you sleep.  We will see you again after your MRI.

## 2019-10-21 NOTE — Assessment & Plan Note (Signed)
Patient's pain seems to be worse with hip testing with true positives for impingement and piriformis syndrome.  She does not have pain with flexion of obliques, which is somewhat odd.  She does have pain with stretching of obliques.  Consider referred pain from her hip causing pain into her left side and back.  Given that she has not had improvement with physical therapy and x-rays did not show significant osteoarthritis, we will move forward with a left hip MRI to further assess for possible tear in musculature.  Will follow up with her after MRI.  She can continue to take Flexeril as needed to help her sleep.

## 2019-11-10 ENCOUNTER — Ambulatory Visit
Admission: RE | Admit: 2019-11-10 | Discharge: 2019-11-10 | Disposition: A | Discharge status: Home / Self Care | Payer: BC Managed Care – PPO | Source: Ambulatory Visit | Attending: Sports Medicine | Admitting: Sports Medicine

## 2019-11-10 ENCOUNTER — Other Ambulatory Visit: Payer: Self-pay

## 2019-11-10 DIAGNOSIS — M25452 Effusion, left hip: Secondary | ICD-10-CM | POA: Diagnosis not present

## 2019-11-10 DIAGNOSIS — M1612 Unilateral primary osteoarthritis, left hip: Secondary | ICD-10-CM | POA: Diagnosis not present

## 2019-11-10 DIAGNOSIS — M25552 Pain in left hip: Secondary | ICD-10-CM

## 2019-11-21 ENCOUNTER — Ambulatory Visit (INDEPENDENT_AMBULATORY_CARE_PROVIDER_SITE_OTHER): Payer: BC Managed Care – PPO | Admitting: Family Medicine

## 2019-11-21 ENCOUNTER — Other Ambulatory Visit: Payer: Self-pay

## 2019-11-21 VITALS — BP 122/78 | Ht 64.0 in | Wt 138.0 lb

## 2019-11-21 DIAGNOSIS — M25552 Pain in left hip: Secondary | ICD-10-CM | POA: Diagnosis not present

## 2019-11-21 NOTE — Patient Instructions (Signed)
Your MRI shows Gluteal Tendinitis as well as a possible hip labral tear on top of mild arthritis.  - Try dedicated physical therapy to strengthen your glutes, hips, and core to see if this offers improvement to your symptoms.  - If your pain does not improve with dedicated PT, we could try a hip injection to help determine if your pain is referred from an intraarticular source.

## 2019-11-21 NOTE — Progress Notes (Signed)
Office Visit Note   Patient: Patricia Lloyd           Date of Birth: 06-21-1960           MRN: [id number] Visit Date: 11/21/2019 Requested by: Deeann Saint, MD [address] [place],  [place] [address] PCP: Deeann Saint, MD  Subjective: CC: Follow up Left hip/Side pain  HPI: 59 year old female presenting to clinic with concerns of ongoing left hip and side pain.  Patient has been seen several times for this discomfort, and recently obtained an MRI of her pelvis to further evaluate.  Patient voices that she is very frustrated with how her pain is continued to worsen, and she wants to know if there is anything that her MRI showed which would lead [place] to better targeting her symptoms.  She continues to be active in Pilates at least twice a week, and feels that this "keeps her going."  She says that there are times that her pain will "grip" her, nearly causing her to fall over.  She also states that she tries to go on walks regularly, but her posterior pain will flare significantly after these attempts.  She says that she prides herself on being active, and is scared that she might lose that ability if nothing is done.  States that pain continues to radiate from her lower left ribs and into her posterior and lateral left thigh.  Pain will also occasionally go deep into her left groin, but she states that this is less troublesome that her discomfort elsewhere.              ROS:   All other systems were reviewed and are negative.  Objective: Vital Signs: BP 122/78   Ht 5\' 4"  (1.626 m)   Wt 138 lb (62.6 kg)   LMP 12/12/2012   BMI 23.69 kg/m   Physical Exam:  General:  Alert and oriented, in no acute distress. Pulm:  Breathing unlabored. Psy:  Normal mood, congruent affect. Skin: Left leg with no bruising or rashes.  Overlying skin intact. Left hip:  Normal gait.  No varus or valgus deformity of the knee, appropriate Q angle of the hip.  No pelvic asymmetry.  Pain with both  internal and external rotation of the left hip.  States that internal rotation causes deep groin pain. Strength: 5 out of 5 strength with hip flexion and extension, as well as knee flexion and extension.  5 out of 5 strength with hip abduction and adduction.   Palpation: Endorses significant tenderness to palpation over the greater trochanteric area.  Additionally has tenderness over the piriformis as well as gluteal musculature.  Pain along left oblique musculature, as well as along left superior pubic bone-this pain is not particularly worsened by activating abdominal musculature.  Supine exam: No hip pain with logroll.  FADIR causes significant deep pain. FABER produces both posterior as well as groin pain.  Trendelenburg's with no obvious pelvic stabilizer weakness.   Imaging: CLINICAL DATA:  Left hip pain for 6 months.  EXAM: MR OF THE LEFT HIP WITHOUT CONTRAST  TECHNIQUE: Multiplanar, multisequence MR imaging was performed. No intravenous contrast was administered.  COMPARISON:  Radiographs 09/26/2019  FINDINGS: Both hips are normally located. Mild bilateral hip joint degenerative changes for age with early joint space narrowing and spurring. Mild bilateral degenerative chondrosis but no full-thickness cartilage defects are identified. No stress fracture or AVN.  Small amount of joint fluid bilaterally but no overt joint effusions.  Findings highly suspicious for a superior anterior labral tear involving the left hip.  Mild bilateral peritendinosis but no trochanteric bursitis. The surrounding hip and pelvic musculature appear normal. No muscle tear, myositis or mass. The hamstring tendons appear intact. Mild tendinopathy.  The pubic symphysis and SI joints are intact. No pelvic fractures or bone lesions.  No significant intrapelvic abnormalities are identified.  IMPRESSION: 1. Mild bilateral hip joint degenerative changes for age. No stress fracture or  AVN. 2. Findings highly suspicious for a superior anterior labral tear involving the left hip. 3. Mild bilateral peritendinosis but no trochanteric bursitis.   Electronically Signed   By: Rudie Meyer M.D.   On: 11/11/2019 11:34  Assessment & Plan: 59 year old female presenting to clinic today with concerns of ongoing left lateral and posterior hip pain, occasionally radiating into her groin.  This is accompanied by left side pain extending to her lower ribs.  MRI was reviewed with patient, which does demonstrate gluteal tendinitis-likely explaining her posterior and lateral pain.  Additionally, patient does have pain deep within her groin, and MRI is suggestive of possible labral tear. -Given her primary concern of posterior lateral hip pain, as well as tenderness to palpation within the gluteal region patient would likely benefit from formal physical therapy. -Patient is already a PT Pilates participant, and is known to physical therapist who works in the studio.  Recommended dedicated gluteal and core exercises to see if this improves her symptoms. -If no improvement after physical therapy, would consider corticosteroid injection into the hip to rule out radicular hip pathology causing the rest of her symptoms. -If no improvement with either of the above, would consider referral [facility]. -Patient expresses understanding, she was given a referral to formal physical therapy today.  She will follow up in 3 to 4 weeks for reevaluation.  It was a preceptor for this visit and available for immediate consultation Marsa Aris, DO     PMFS History: Patient Active Problem List   Diagnosis Date Noted  . Lateral pain of left hip 07/15/2019  . Precordial chest pain 01/31/2019  . Palpitation 01/30/2019  . Educated about COVID-19 virus infection 01/30/2019  . Urinary incontinence 01/20/2019  . Loss of transverse plantar arch of right foot  07/02/2018  . Peroneal tendinitis, right 07/02/2018  . Morton neuroma, right 07/02/2018  . Sinusitis, acute 04/21/2015  . Cervical radiculitis 09/03/2014  . Left arm pain 09/02/2014  . Strain of right tibialis anterior muscle 06/24/2014  . Tendonitis of left rotator cuff 06/24/2014  . Fibromyalgia 06/10/2014  . Varicose veins of lower extremities with other complications 01/28/2013  . Leg edema, right 01/28/2013  . Dizziness and giddiness 12/25/2012   Past Medical History:  Diagnosis Date  . Fibromyalgia   . H/O fatigue   . Insomnia   . PONV (postoperative nausea and vomiting)    severe    Family History  Problem Relation Age of Onset  . Cancer Mother        Pancreatic   . Hypertension Father   . Cancer Maternal Aunt     Past Surgical History:  Procedure Laterality Date  . CESAREAN SECTION  1995  . CESAREAN SECTION  2000  . DILATATION & CURETTAGE/HYSTEROSCOPY WITH TRUECLEAR N/A 01/01/2013   Procedure: DILATATION & CURETTAGE/HYSTEROSCOPY WITH TRUCLEAR;  Surgeon: Meriel Pica, MD;  Location: [facility];  Service: Gynecology;  Laterality: N/A;  . SHOULDER SURGERY  05/03/2010   Left shoulder  surgery, Dr.Doldorf    . TMJ ARTHROPLASTY  1990   Social History   Occupational History  . Occupation: Water quality scientist: [employer]  Tobacco Use  . Smoking status: Former Games developer  . Smokeless tobacco: Never Used  . Tobacco comment: Social smoker quit at age 52  Substance and Sexual Activity  . Alcohol use: Yes    Alcohol/week: 0.0 standard drinks    Comment: 2 GLASSES WINE DAILY   . Drug use: No  . Sexual activity: Not on file

## 2019-11-26 DIAGNOSIS — F4323 Adjustment disorder with mixed anxiety and depressed mood: Secondary | ICD-10-CM | POA: Diagnosis not present

## 2019-12-02 ENCOUNTER — Ambulatory Visit: Payer: BC Managed Care – PPO | Admitting: Sports Medicine

## 2019-12-10 DIAGNOSIS — F4323 Adjustment disorder with mixed anxiety and depressed mood: Secondary | ICD-10-CM | POA: Diagnosis not present

## 2019-12-15 ENCOUNTER — Other Ambulatory Visit: Payer: Self-pay

## 2019-12-15 ENCOUNTER — Ambulatory Visit: Payer: BC Managed Care – PPO | Attending: Sports Medicine | Admitting: Physical Therapy

## 2019-12-15 ENCOUNTER — Encounter: Payer: Self-pay | Admitting: Physical Therapy

## 2019-12-15 DIAGNOSIS — S73192A Other sprain of left hip, initial encounter: Secondary | ICD-10-CM

## 2019-12-15 DIAGNOSIS — M25552 Pain in left hip: Secondary | ICD-10-CM

## 2019-12-15 DIAGNOSIS — M25652 Stiffness of left hip, not elsewhere classified: Secondary | ICD-10-CM | POA: Diagnosis not present

## 2019-12-15 NOTE — Patient Instructions (Signed)
Access Code: [email or web address]: [email or web address]: 12/06/2021Prepared by: Victorino Dike PaaExercises  Supine Lower Trunk Rotation - 2 x daily - 7 x weekly - 2 sets - 10 reps - 10 hold  Supine Piriformis Stretch with Foot on Ground - 1 x daily - 7 x weekly - 1 sets - 3 reps - 30 hold  Supine Bridge with Resistance Band - 1 x daily - 7 x weekly - 2 sets - 10 reps - 5 hold  Standing Hip Flexor Stretch - 1 x daily - 7 x weekly - 2 sets - 3 reps - 30 hold  Standing Quadratus Lumborum Stretch with Doorway - 1 x daily - 7 x weekly - 1 sets - 3-5 reps - 30 hold  Standing Quadratus Lumborum Mobilization with Small Ball on Wall - 1 x daily - 7 x weekly - 2 sets - 10 reps  Sidelying Quadratus Lumborum Stretch on Table - 2 x daily - 7 x weekly - 1 sets - 3-5 reps - 30 hold

## 2019-12-15 NOTE — Therapy (Signed)
[facility] [address] [place], [place], [address] Phone: [phone]   Fax:  [phone]  Physical Therapy Evaluation  Patient Details  Name: Patricia Lloyd MRN: [id number] Date of Birth: [birth year] Referring Provider (PT): Dr. Reino Bellis    Encounter Date: 12/15/2019   PT End of Session - 12/15/19 0935    Visit Number 1    Number of Visits 12    Date for PT Re-Evaluation 02/09/20    Authorization Type BCBS    PT Start Time 0920    PT Stop Time 1015    PT Time Calculation (min) 55 min           Past Medical History:  Diagnosis Date  . Fibromyalgia   . H/O fatigue   . Insomnia   . PONV (postoperative nausea and vomiting)    severe    Past Surgical History:  Procedure Laterality Date  . CESAREAN SECTION  1995  . CESAREAN SECTION  2000  . DILATATION & CURETTAGE/HYSTEROSCOPY WITH TRUECLEAR N/A 01/01/2013   Procedure: DILATATION & CURETTAGE/HYSTEROSCOPY WITH TRUCLEAR;  Surgeon: Meriel Pica, MD;  Location: [facility];  Service: Gynecology;  Laterality: N/A;  . SHOULDER SURGERY  05/03/2010   Left shoulder surgery, Dr.Doldorf    . TMJ ARTHROPLASTY  1990    There were no vitals filed for this visit.    Subjective Assessment - 12/15/19 0925    Subjective I have been in tremendous pain for the last 3 days.  Meds make her really lethargic. She has had this pain for 6-7 mos.  L trunk down to L groin and lateral hip. She does Pilates 1-2 times per week but drives back and forth to [place] and [place]. She has difficulty standing, driving does not help . I worked everyday last week and Saturday, I could barely keep standing.  L Leg feels like it might not hold me.  Pilates makes me hurt only for 1 day and it has really saved me. She is obviously frustrated with her pain and length of time to discern the problem.    Pertinent History fibromyalgia, L shoulder surgery    Limitations Sitting;House hold  activities;Lifting;Standing;Walking;Other (comment)   work   Diagnostic tests MRI: 11/21:suspicious superior anterior labral tear involving the left hip.    Patient Stated Goals Pain relief    Currently in Pain? Yes    Pain Score 6     Pain Location Hip    Pain Orientation Left    Pain Descriptors / Indicators Aching;Sore    Pain Type Chronic pain    Pain Radiating Towards L trunk, hip and post hip/buttocks    Pain Onset More than a month ago    Pain Frequency Constant    Aggravating Factors  standing    Pain Relieving Factors limited , Meloxicam, sitting, stretching at time, heated seats    Effect of Pain on Daily Activities hard to find energy, tired, pain              [facility] PT Assessment - 12/15/19 0001      Assessment   Medical Diagnosis L hip gluteal tendinosis (MRI later showed likely labral tear)     Referring Provider (PT) Dr. Reino Bellis     Onset Date/Surgical Date --   7 mos    Prior Therapy No       Precautions   Precautions None      Restrictions   Weight Bearing Restrictions No  Balance Screen   Has the patient fallen in the past 6 months No    Has the patient had a decrease in activity level because of a fear of falling?  Yes    Is the patient reluctant to leave their home because of a fear of falling?  No      Home Tourist information centre manager residence    Living Arrangements Spouse/significant other      Prior Function   Level of Independence Independent    Vocation Full time employment    Tourist information centre manager, Education administrator in [place]     Leisure limited due to time , Pilates       Cognition   Overall Cognitive Status Within Functional Limits for tasks assessed      Observation/Other Assessments   Focus on Therapeutic Outcomes (FOTO)  NT due to time,not set up      Sensation   Light Touch Appears Intact      Functional Tests   Functional tests Squat;Single leg stance      Squat    Comments min pain L ant hip       Single Leg Stance   Comments good bilateral , Rt pelvis slightly dropped down (not L )       Posture/Postural Control   Posture/Postural Control Postural limitations    Posture Comments mild thoracic kyphosis       AROM   Lumbar Flexion WNL     Lumbar - Right Side Bend pain on L (ribs)    Lumbar - Left Side Bend WNL    Lumbar - Right Rotation WNL    Lumbar - Left Rotation pain on L       PROM   Overall PROM Comments L hip: flexion 90 deg, IR 20 deg, ER 55 deg.  Pain with flexion and IR.  Rt LE WFL, no pain       Strength   Overall Strength Comments WNL other than L hip flex 4/5, hip abd 4+/5      Flexibility   Quadratus Lumborum L QL in spasm    12 the rib painful      Palpation   Palpation comment Pain along L gr trochanter, ITB into glute medius and max, piriformis,.  Especially tight is L QL in spasm and with limited tolerance for release                       Objective measurements completed on examination: See above findings.       [facility] Adult PT Treatment/Exercise - 12/15/19 0001      Self-Care   Other Self-Care Comments  see education       Moist Heat Therapy   Number Minutes Moist Heat 15 Minutes    Moist Heat Location Lumbar Spine;Hip      Electrical Stimulation   Electrical Stimulation Location L trunk in sidelying     Electrical Stimulation Action IFC    Electrical Stimulation Parameters 23.5     Electrical Stimulation Goals Pain                  PT Education - 12/15/19 1317    Education Details PT/POC, financials (with front desk asst-co-pays), HEP (emailed) and resources for massage, eval findings, IFC home TENS    Person(s) Educated Patient    Methods Explanation;Demonstration;Tactile cues;Verbal cues;Handout    Comprehension Verbalized understanding;Returned demonstration;Need further instruction  PT Short Term Goals - 12/15/19 1322      PT SHORT TERM GOAL #1   Title Pt will  be able to report less pain in L trunk, ribs with stretching and manual PT    Time 4    Period Weeks    Status New    Target Date 01/12/20      PT SHORT TERM GOAL #2   Title Pt will be I with HEP for L hip, trunk and be consistent    Time 4    Period Weeks    Status New    Target Date 01/12/20             PT Long Term Goals - 12/15/19 1319      PT LONG TERM GOAL #1   Title Pt will be I with HEP    Time 8    Period Weeks    Status New    Target Date 02/09/20      PT LONG TERM GOAL #2   Title Pt will be able to report no pain with sidebending, changing position from sit, supine.    Time 8    Period Weeks    Status New    Target Date 02/09/20      PT LONG TERM GOAL #3   Title Pt will be able to stand for 4 hours at a time with no more than moderate, relieved by sitting.    Time 8    Period Weeks    Status New    Target Date 02/09/20      PT LONG TERM GOAL #4   Title Pt will be able to make modifcations to car seat, bed, home to reduce pain in hips, back    Time 8    Period Weeks    Status New    Target Date 02/09/20                  Plan - 12/15/19 1323    Clinical Impression Statement Patient with mod complexity eval for L hip which has been identified as a labral tear.  About the same time this pain began she recalls an episode of L trunk pain in Pilates class which was diagnosed a cartilage issue.  the pain has continued and progressed and is quite limiting to her ADLs, IADLs.  She has decent strength but L ROM painful and stiff.  Pos FADIR.  She has some issues with what her insurance will cover and co-pays so she may not be able to attend beyond today.  She will continue Pilates , do HEP and consider massage therapy for pain relief.    Personal Factors and Comorbidities Comorbidity 1;Time since onset of injury/illness/exacerbation;Profession    Comorbidities fibromyalgia    Examination-Activity Limitations Lift;Stand;Bed Mobility;Locomotion  Level;Bend;Squat;Sleep;Sit;Transfers    Examination-Participation Restrictions Cleaning;Community Activity;Driving;Interpersonal Relationship;Occupation    Stability/Clinical Decision Making Evolving/Moderate complexity    Clinical Decision Making Moderate    Rehab Potential Good    PT Frequency 2x / week   depending on financial/co-pay   PT Duration 8 weeks    PT Treatment/Interventions ADLs/Self Care Home Management;Iontophoresis 4mg /ml Dexamethasone;Passive range of motion;Neuromuscular re-education;Moist Heat;Cryotherapy;Electrical Stimulation;Ultrasound;Functional mobility training;Therapeutic activities;Therapeutic exercise;Patient/family education;Manual techniques;Dry needling;Taping    PT Next Visit Plan HEP. Modalities for L Gr Trochanter/glute.  manual to L QL, DN    PT Home Exercise Plan [email or web address]    Consulted and Agree with Plan of Care Patient  Patient will benefit from skilled therapeutic intervention in order to improve the following deficits and impairments:  Increased fascial restricitons, Pain, Decreased mobility, Decreased activity tolerance, Decreased range of motion, Decreased strength, Hypomobility, Impaired flexibility, Difficulty walking  Visit Diagnosis: Pain in left hip  Tear of left acetabular labrum, initial encounter  Stiffness of left hip, not elsewhere classified     Problem List Patient Active Problem List   Diagnosis Date Noted  . Lateral pain of left hip 07/15/2019  . Precordial chest pain 01/31/2019  . Palpitation 01/30/2019  . Educated about COVID-19 virus infection 01/30/2019  . Urinary incontinence 01/20/2019  . Loss of transverse plantar arch of right foot 07/02/2018  . Peroneal tendinitis, right 07/02/2018  . Morton neuroma, right 07/02/2018  . Sinusitis, acute 04/21/2015  . Cervical radiculitis 09/03/2014  . Left arm pain 09/02/2014  . Strain of right tibialis anterior muscle 06/24/2014  . Tendonitis of left rotator cuff  06/24/2014  . Fibromyalgia 06/10/2014  . Varicose veins of lower extremities with other complications 01/28/2013  . Leg edema, right 01/28/2013  . Dizziness and giddiness 12/25/2012    Joron Velis 12/15/2019, 1:35 PM  [facility] [address] [place], [place], [address] Phone: [phone]   Fax:  [phone]  Name: Pixie CasinoMaribeth G Ogden MRN: [id number] Date of Birth: 10/18/1960   Karie MainlandJennifer Smt. Loder, PT 12/15/19 1:35 PM Phone: [phone] Fax: [phone]

## 2019-12-24 DIAGNOSIS — Z1231 Encounter for screening mammogram for malignant neoplasm of breast: Secondary | ICD-10-CM | POA: Diagnosis not present

## 2020-01-06 DIAGNOSIS — M25552 Pain in left hip: Secondary | ICD-10-CM | POA: Diagnosis not present

## 2020-01-08 DIAGNOSIS — F4323 Adjustment disorder with mixed anxiety and depressed mood: Secondary | ICD-10-CM | POA: Diagnosis not present

## 2020-01-08 DIAGNOSIS — M25552 Pain in left hip: Secondary | ICD-10-CM | POA: Diagnosis not present

## 2020-01-13 DIAGNOSIS — M25552 Pain in left hip: Secondary | ICD-10-CM | POA: Diagnosis not present

## 2020-01-15 DIAGNOSIS — M25552 Pain in left hip: Secondary | ICD-10-CM | POA: Diagnosis not present

## 2020-01-19 DIAGNOSIS — M25552 Pain in left hip: Secondary | ICD-10-CM | POA: Diagnosis not present

## 2020-01-22 ENCOUNTER — Other Ambulatory Visit: Payer: Self-pay

## 2020-01-23 ENCOUNTER — Ambulatory Visit: Payer: BC Managed Care – PPO | Admitting: Family Medicine

## 2020-01-23 DIAGNOSIS — M25552 Pain in left hip: Secondary | ICD-10-CM | POA: Diagnosis not present

## 2020-01-27 DIAGNOSIS — F4323 Adjustment disorder with mixed anxiety and depressed mood: Secondary | ICD-10-CM | POA: Diagnosis not present

## 2020-01-29 ENCOUNTER — Other Ambulatory Visit: Payer: Self-pay

## 2020-01-29 ENCOUNTER — Ambulatory Visit (INDEPENDENT_AMBULATORY_CARE_PROVIDER_SITE_OTHER): Payer: BC Managed Care – PPO | Admitting: Family Medicine

## 2020-01-29 ENCOUNTER — Encounter: Payer: Self-pay | Admitting: Family Medicine

## 2020-01-29 ENCOUNTER — Ambulatory Visit (INDEPENDENT_AMBULATORY_CARE_PROVIDER_SITE_OTHER): Payer: BC Managed Care – PPO

## 2020-01-29 VITALS — BP 120/80 | HR 64 | Temp 97.8°F | Wt 141.4 lb

## 2020-01-29 DIAGNOSIS — R0781 Pleurodynia: Secondary | ICD-10-CM

## 2020-01-29 NOTE — Progress Notes (Signed)
Subjective:    Patient ID: Patricia Lloyd, female    DOB: 1960/07/17, 60 y.o.   MRN: [id number]  No chief complaint on file.   HPI Patient was seen today for ongoing concern.  Patient endorses left lower rib cage pain x 6 months.  Pain started after patient had a loud pop while doing Pilates in July.  At the time pt was bending sideways over a hard raised surface down in a "trough".  Pt notes immediate pain and barely being able to move.  Pt went to Ortho in [place] who thought pt had intercostal muscle strain.  Pt was then thought to have the pain 2/2 L hip issues.  Imaging of left hip revealed torn left labrum.  Pain in left side is now a 6/10.  Pt notes her L lower rib cage is lower than the R side of her rib cage.  Pt cannot lay on her L side 2/2 pain.    Patient doing OPTi via diet.  Eating several small meals per day and 1000 cal/day.  Patient has cut down on sugar intake and carbs in an attempt to reduce the inflammation in her body.  Patient notices improvement in joint pain.  Past Medical History:  Diagnosis Date  . Fibromyalgia   . H/O fatigue   . Insomnia   . PONV (postoperative nausea and vomiting)    severe    No Known Allergies  ROS General: Denies fever, chills, night sweats, changes in weight, changes in appetite HEENT: Denies headaches, ear pain, changes in vision, rhinorrhea, sore throat CV: Denies CP, palpitations, SOB, orthopnea Pulm: Denies SOB, cough, wheezing GI: Denies abdominal pain, nausea, vomiting, diarrhea, constipation GU: Denies dysuria, hematuria, frequency, vaginal discharge Msk: Denies muscle cramps, joint pains  + left lower rib cage pain and edema Neuro: Denies weakness, numbness, tingling Skin: Denies rashes, bruising Psych: Denies depression, anxiety, hallucinations    Objective:    Blood pressure 120/80, pulse 64, temperature 97.8 F (36.6 C), temperature source Oral, weight 141 lb 6.4 oz (64.1 kg), last menstrual period 12/12/2012,  SpO2 98 %.  Gen. Pleasant, well-nourished, in no distress, normal affect  HEENT: [place]/AT, face symmetric, conjunctiva clear, no scleral icterus, PERRLA, EOMI, nares patent without drainage Lungs: no accessory muscle use, CTAB, no wheezes or rales Cardiovascular: RRR, no m/r/g, no peripheral edema Abdomen: BS present, soft, NT/ND, no hepatosplenomegaly. Musculoskeletal: Asymmetry of lower rib cage with left side higher than right side.  Fullness of soft tissue at lower edge of L rib cage with TTP along lower costal margin.  No deformities, no cyanosis or clubbing, normal tone Neuro:  A&Ox3, CN II-XII intact, normal gait Skin:  Warm, no lesions/ rash   Wt Readings from Last 3 Encounters:  01/29/20 141 lb 6.4 oz (64.1 kg)  11/21/19 138 lb (62.6 kg)  10/21/19 136 lb (61.7 kg)    Lab Results  Component Value Date   WBC 5.2 09/12/2019   HGB 12.7 09/12/2019   HCT 38.9 09/12/2019   PLT 239 09/12/2019   GLUCOSE 86 09/12/2019   CHOL 182 05/29/2014   TRIG 51 05/29/2014   HDL 79 05/29/2014   LDLCALC 93 05/29/2014   ALT 15 09/12/2019   AST 21 09/12/2019   NA 139 09/12/2019   K 4.3 09/12/2019   CL 106 09/12/2019   CREATININE 0.83 09/12/2019   BUN 21 09/12/2019   CO2 26 09/12/2019   TSH 2.36 01/16/2019    Assessment/Plan:  Rib pain on left side  -  Discussed possible causes including muscle strain versus tear, rib fracture soft tissue mass such as a lipoma -We will obtain imaging -Continue supportive care including Tylenol, rest, heat -Further recommendations based on imaging -Consider PT - Plan: DG Chest 2 View  F/u as needed  Abbe Amsterdam, MD

## 2020-01-29 NOTE — Patient Instructions (Signed)
Chest Wall Pain Chest wall pain is pain in or around the bones and muscles of your chest. Sometimes, an injury causes this pain. Excessive coughing or overuse of arm and chest muscles may also cause chest wall pain. Sometimes, the cause may not be known. This pain may take several weeks or longer to get better. Follow these instructions at home: Managing pain, stiffness, and swelling  If directed, put ice on the painful area: ? Put ice in a plastic bag. ? Place a towel between your skin and the bag. ? Leave the ice on for 20 minutes, 2-3 times per day.   Activity  Rest as told by your health care provider.  Avoid activities that cause pain. These include any activities that use your chest muscles or your abdominal and side muscles to lift heavy items. Ask your health care provider what activities are safe for you. General instructions  Take over-the-counter and prescription medicines only as told by your health care provider.  Do not use any products that contain nicotine or tobacco, such as cigarettes, e-cigarettes, and chewing tobacco. These can delay healing after injury. If you need help quitting, ask your health care provider.  Keep all follow-up visits as told by your health care provider. This is important.   Contact a health care provider if:  You have a fever.  Your chest pain becomes worse.  You have new symptoms. Get help right away if:  You have nausea or vomiting.  You feel sweaty or light-headed.  You have a cough with mucus from your lungs (sputum) or you cough up blood.  You develop shortness of breath. These symptoms may represent a serious problem that is an emergency. Do not wait to see if the symptoms will go away. Get medical help right away. Call your local emergency services (911 in the [place] Do not drive yourself to the hospital. Summary  Chest wall pain is pain in or around the bones and muscles of your chest.  Depending on the cause, it may be  treated with ice, rest, medicines, and avoiding activities that cause pain.  Contact a health care provider if you have a fever, worsening chest pain, or new symptoms.  Get help right away if you feel light-headed or you develop shortness of breath. These symptoms may be an emergency. This information is not intended to replace advice given to you by your health care provider. Make sure you discuss any questions you have with your health care provider. Document Revised: 06/28/2017 Document Reviewed: 06/28/2017 Elsevier Patient Education  2021 Elsevier Inc.  Calorie Counting for [employer] Calories are units of energy. Your body needs a certain number of calories from food to keep going throughout the day. When you eat or drink more calories than your body needs, your body stores the extra calories mostly as fat. When you eat or drink fewer calories than your body needs, your body burns fat to get the energy it needs. Calorie counting means keeping track of how many calories you eat and drink each day. Calorie counting can be helpful if you need to lose weight. If you eat fewer calories than your body needs, you should lose weight. Ask your health care provider what a healthy weight is for you. For calorie counting to work, you will need to eat the right number of calories each day to lose a healthy amount of weight per week. A dietitian can help you figure out how many calories you need in a  day and will suggest ways to reach your calorie goal.  A healthy amount of weight to lose each week is usually 1-2 lb (0.5-0.9 kg). This usually means that your daily calorie intake should be reduced by 500-750 calories.  Eating 1,200-1,500 calories a day can help most women lose weight.  Eating 1,500-1,800 calories a day can help most men lose weight. What do I need to know about calorie counting? Work with your health care provider or dietitian to determine how many calories you should get each day. To  meet your daily calorie goal, you will need to:  Find out how many calories are in each food that you would like to eat. Try to do this before you eat.  Decide how much of the food you plan to eat.  Keep a food log. Do this by writing down what you ate and how many calories it had. To successfully lose weight, it is important to balance calorie counting with a healthy lifestyle that includes regular activity. Where do I find calorie information? The number of calories in a food can be found on a Nutrition Facts label. If a food does not have a Nutrition Facts label, try to look up the calories online or ask your dietitian for help. Remember that calories are listed per serving. If you choose to have more than one serving of a food, you will have to multiply the calories per serving by the number of servings you plan to eat. For example, the label on a package of bread might say that a serving size is 1 slice and that there are 90 calories in a serving. If you eat 1 slice, you will have eaten 90 calories. If you eat 2 slices, you will have eaten 180 calories.   How do I keep a food log? After each time that you eat, record the following in your food log as soon as possible:  What you ate. Be sure to include toppings, sauces, and other extras on the food.  How much you ate. This can be measured in cups, ounces, or number of items.  How many calories were in each food and drink.  The total number of calories in the food you ate. Keep your food log near you, such as in a pocket-sized notebook or on an app or website on your mobile phone. Some programs will calculate calories for you and show you how many calories you have left to meet your daily goal. What are some portion-control tips?  Know how many calories are in a serving. This will help you know how many servings you can have of a certain food.  Use a measuring cup to measure serving sizes. You could also try weighing out portions on a  kitchen scale. With time, you will be able to estimate serving sizes for some foods.  Take time to put servings of different foods on your favorite plates or in your favorite bowls and cups so you know what a serving looks like.  Try not to eat straight from a food's packaging, such as from a bag or box. Eating straight from the package makes it hard to see how much you are eating and can lead to overeating. Put the amount you would like to eat in a cup or on a plate to make sure you are eating the right portion.  Use smaller plates, glasses, and bowls for smaller portions and to prevent overeating.  Try not to multitask. For example, avoid  watching TV or using your computer while eating. If it is time to eat, sit down at a table and enjoy your food. This will help you recognize when you are full. It will also help you be more mindful of what and how much you are eating. What are tips for following this plan? Reading food labels  Check the calorie count compared with the serving size. The serving size may be smaller than what you are used to eating.  Check the source of the calories. Try to choose foods that are high in protein, fiber, and vitamins, and low in saturated fat, trans fat, and sodium. Shopping  Read nutrition labels while you shop. This will help you make healthy decisions about which foods to buy.  Pay attention to nutrition labels for low-fat or fat-free foods. These foods sometimes have the same number of calories or more calories than the full-fat versions. They also often have added sugar, starch, or salt to make up for flavor that was removed with the fat.  Make a grocery list of lower-calorie foods and stick to it. Cooking  Try to cook your favorite foods in a healthier way. For example, try baking instead of frying.  Use low-fat dairy products. Meal planning  Use more fruits and vegetables. One-half of your plate should be fruits and vegetables.  Include lean  proteins, such as chicken, [place], and fish. Lifestyle Each week, aim to do one of the following:  150 minutes of moderate exercise, such as walking.  75 minutes of vigorous exercise, such as running. General information  Know how many calories are in the foods you eat most often. This will help you calculate calorie counts faster.  Find a way of tracking calories that works for you. Get creative. Try different apps or programs if writing down calories does not work for you. What foods should I eat?  Eat nutritious foods. It is better to have a nutritious, high-calorie food, such as an avocado, than a food with few nutrients, such as a bag of potato chips.  Use your calories on foods and drinks that will fill you up and will not leave you hungry soon after eating. ? Examples of foods that fill you up are nuts and nut butters, vegetables, lean proteins, and high-fiber foods such as whole grains. High-fiber foods are foods with more than 5 g of fiber per serving.  Pay attention to calories in drinks. Low-calorie drinks include water and unsweetened drinks. The items listed above may not be a complete list of foods and beverages you can eat. Contact a dietitian for more information.   What foods should I limit? Limit foods or drinks that are not good sources of vitamins, minerals, or protein or that are high in unhealthy fats. These include:  Candy.  Other sweets.  Sodas, specialty coffee drinks, alcohol, and juice. The items listed above may not be a complete list of foods and beverages you should avoid. Contact a dietitian for more information. How do I count calories when eating out?  Pay attention to portions. Often, portions are much larger when eating out. Try these tips to keep portions smaller: ? Consider sharing a meal instead of getting your own. ? If you get your own meal, eat only half of it. Before you start eating, ask for a container and put half of your meal into  it. ? When available, consider ordering smaller portions from the menu instead of full portions.  Pay attention to your  food and drink choices. Knowing the way food is cooked and what is included with the meal can help you eat fewer calories. ? If calories are listed on the menu, choose the lower-calorie options. ? Choose dishes that include vegetables, fruits, whole grains, low-fat dairy products, and lean proteins. ? Choose items that are boiled, broiled, grilled, or steamed. Avoid items that are buttered, battered, fried, or served with cream sauce. Items labeled as crispy are usually fried, unless stated otherwise. ? Choose water, low-fat milk, unsweetened iced tea, or other drinks without added sugar. If you want an alcoholic beverage, choose a lower-calorie option, such as a glass of wine or light beer. ? Ask for dressings, sauces, and syrups on the side. These are usually high in calories, so you should limit the amount you eat. ? If you want a salad, choose a garden salad and ask for grilled meats. Avoid extra toppings such as bacon, cheese, or fried items. Ask for the dressing on the side, or ask for olive oil and vinegar or lemon to use as dressing.  Estimate how many servings of a food you are given. Knowing serving sizes will help you be aware of how much food you are eating at restaurants. Where to find more information  Centers for Disease Control and Prevention: FootballExhibition.com.br  [place] Department of Agriculture: WrestlingReporter.dk Summary  Calorie counting means keeping track of how many calories you eat and drink each day. If you eat fewer calories than your body needs, you should lose weight.  A healthy amount of weight to lose per week is usually 1-2 lb (0.5-0.9 kg). This usually means reducing your daily calorie intake by 500-750 calories.  The number of calories in a food can be found on a Nutrition Facts label. If a food does not have a Nutrition Facts label, try to look up the  calories online or ask your dietitian for help.  Use smaller plates, glasses, and bowls for smaller portions and to prevent overeating.  Use your calories on foods and drinks that will fill you up and not leave you hungry shortly after a meal. This information is not intended to replace advice given to you by your health care provider. Make sure you discuss any questions you have with your health care provider. Document Revised: 02/06/2019 Document Reviewed: 02/06/2019 Elsevier Patient Education  2021 [employer].

## 2020-02-02 DIAGNOSIS — M25552 Pain in left hip: Secondary | ICD-10-CM | POA: Diagnosis not present

## 2020-02-03 ENCOUNTER — Telehealth: Payer: Self-pay | Admitting: Family Medicine

## 2020-02-03 NOTE — Telephone Encounter (Signed)
Patient is returning a call from Nancy.  Please advise.  

## 2020-02-04 DIAGNOSIS — M25552 Pain in left hip: Secondary | ICD-10-CM | POA: Diagnosis not present

## 2020-02-04 NOTE — Telephone Encounter (Signed)
Spoke with pt reviewed x-ray results, verbalized understanding °

## 2020-02-06 ENCOUNTER — Encounter: Payer: Self-pay | Admitting: Family Medicine

## 2020-02-09 DIAGNOSIS — M25552 Pain in left hip: Secondary | ICD-10-CM | POA: Diagnosis not present

## 2020-02-13 DIAGNOSIS — M25552 Pain in left hip: Secondary | ICD-10-CM | POA: Diagnosis not present

## 2020-02-17 DIAGNOSIS — M25552 Pain in left hip: Secondary | ICD-10-CM | POA: Diagnosis not present

## 2020-02-19 ENCOUNTER — Other Ambulatory Visit: Payer: Self-pay

## 2020-02-19 ENCOUNTER — Ambulatory Visit: Payer: BC Managed Care – PPO | Admitting: Sports Medicine

## 2020-02-19 VITALS — BP 122/80 | Ht 64.5 in | Wt 133.0 lb

## 2020-02-19 DIAGNOSIS — S2232XS Fracture of one rib, left side, sequela: Secondary | ICD-10-CM

## 2020-02-19 DIAGNOSIS — F4323 Adjustment disorder with mixed anxiety and depressed mood: Secondary | ICD-10-CM | POA: Diagnosis not present

## 2020-02-19 DIAGNOSIS — S2232XG Fracture of one rib, left side, subsequent encounter for fracture with delayed healing: Secondary | ICD-10-CM | POA: Diagnosis not present

## 2020-02-19 NOTE — Assessment & Plan Note (Signed)
With this much delay in healing of 8 months I believe we should shoot a chest CT to be sure whether there is a nonunion or any other factor that causes the persistent rib problems  For symptom relief we gave her a rib belt

## 2020-02-19 NOTE — Progress Notes (Signed)
Chief complaint: Left lower rib cage pain  Patient first hurt her ribs in July 2021 This was in Pilates class and was associated with a pop She was seen and felt to have a costochondral strain at an emergency area We saw her in the office and found some oblique muscle spasm but no obvious rib fracture At the time her hip was more painful because of a labral tear that has subsequently been diagnosed with MRI  However over the months her rib cage pain has not resolved She has continued working activity and doing Pilates Certain positions cause significant pain  Her primary care physician did a chest x-ray which shows a possible chronic left 10th anterior lateral rib fracture  Review of systems No shortness of breath No cough  Past history Significant for osteopenia No chronic illnesses Normal mammogram in the past 6 months  Physical exam Pleasant female in no acute distress BP 122/80   Ht 5' 4.5" (1.638 m)   Wt 133 lb (60.3 kg)   LMP 12/12/2012   BMI 22.48 kg/m   She has clear lung Anthany [place]hill Pain to palpation along the lower anterior lateral ribs on the left only Pain with any compression of the rib cage Review of x-ray reveals a sclerotic appearance in an area that could represent a partially healed rib fracture Ultrasound of the area shows that the area of rib where she has maximal tenderness is less distinct and not is hyperechoic but no clear step-off

## 2020-02-20 DIAGNOSIS — M25552 Pain in left hip: Secondary | ICD-10-CM | POA: Diagnosis not present

## 2020-02-24 DIAGNOSIS — H2513 Age-related nuclear cataract, bilateral: Secondary | ICD-10-CM | POA: Diagnosis not present

## 2020-02-24 DIAGNOSIS — H5203 Hypermetropia, bilateral: Secondary | ICD-10-CM | POA: Diagnosis not present

## 2020-02-27 DIAGNOSIS — M25552 Pain in left hip: Secondary | ICD-10-CM | POA: Diagnosis not present

## 2020-03-01 DIAGNOSIS — M25552 Pain in left hip: Secondary | ICD-10-CM | POA: Diagnosis not present

## 2020-03-08 DIAGNOSIS — M25552 Pain in left hip: Secondary | ICD-10-CM | POA: Diagnosis not present

## 2020-03-12 ENCOUNTER — Ambulatory Visit
Admission: RE | Admit: 2020-03-12 | Discharge: 2020-03-12 | Disposition: A | Discharge status: Home / Self Care | Payer: BC Managed Care – PPO | Source: Ambulatory Visit | Attending: Sports Medicine | Admitting: Sports Medicine

## 2020-03-12 ENCOUNTER — Other Ambulatory Visit: Payer: Self-pay

## 2020-03-12 DIAGNOSIS — S2242XD Multiple fractures of ribs, left side, subsequent encounter for fracture with routine healing: Secondary | ICD-10-CM | POA: Diagnosis not present

## 2020-03-12 DIAGNOSIS — I119 Hypertensive heart disease without heart failure: Secondary | ICD-10-CM | POA: Diagnosis not present

## 2020-03-12 DIAGNOSIS — I712 Thoracic aortic aneurysm, without rupture: Secondary | ICD-10-CM | POA: Diagnosis not present

## 2020-03-12 DIAGNOSIS — S2232XA Fracture of one rib, left side, initial encounter for closed fracture: Secondary | ICD-10-CM | POA: Diagnosis not present

## 2020-03-12 DIAGNOSIS — S2232XS Fracture of one rib, left side, sequela: Secondary | ICD-10-CM

## 2020-03-15 ENCOUNTER — Ambulatory Visit (INDEPENDENT_AMBULATORY_CARE_PROVIDER_SITE_OTHER): Payer: BC Managed Care – PPO

## 2020-03-15 ENCOUNTER — Other Ambulatory Visit: Payer: Self-pay

## 2020-03-15 ENCOUNTER — Ambulatory Visit: Payer: BC Managed Care – PPO

## 2020-03-15 DIAGNOSIS — Z23 Encounter for immunization: Secondary | ICD-10-CM

## 2020-03-15 NOTE — Progress Notes (Signed)
Pt was given 2nd dose of Shingrix, tolerated well

## 2020-03-23 ENCOUNTER — Ambulatory Visit: Payer: BC Managed Care – PPO | Admitting: Sports Medicine

## 2020-03-29 ENCOUNTER — Ambulatory Visit: Payer: BC Managed Care – PPO | Admitting: Sports Medicine

## 2020-03-29 ENCOUNTER — Other Ambulatory Visit: Payer: Self-pay

## 2020-03-29 VITALS — BP 120/84 | Ht 64.0 in | Wt 132.0 lb

## 2020-03-29 DIAGNOSIS — M25552 Pain in left hip: Secondary | ICD-10-CM | POA: Diagnosis not present

## 2020-03-29 DIAGNOSIS — S73192D Other sprain of left hip, subsequent encounter: Secondary | ICD-10-CM

## 2020-03-30 NOTE — Progress Notes (Signed)
Patient ID: Patricia Lloyd, female   DOB: 1960-11-11, 60 y.o.   MRN: [id number]  Rory comes in today for follow-up.  I see her in lieu of Dr. Darrick Penna.  Recent CT scan of her chest shows a solidly healed left ninth rib fracture.  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  Most of her pain centers around the left hip where she has been diagnosed with a labral tear.  She is actively involved in physical therapy and Pilates trying to rehab  but she is clearly becoming more and more frustrated with it.  Physical exam was not repeated today.  We simply talked about her CT scan of her chest and her chronic left hip pain.  I reassured her that she has no evidence of a nonunited or malunited rib fracture.  I also explained to her the importance of follow-up on the dilated thoracic aorta.  She will bring this up with both her PCP and her cardiologist.  In regards to her chronic left hip pain, I discussed treatment options including continuing with physical therapy, intra-articular cortisone injection, and surgical referral to see if there is anything surgically that may be offered to her.  She understands that the only surgical option may be a total hip arthroplasty but based on her level of discomfort I think she should meet with a surgeon to discuss those options.  She is in agreement with that plan.  I will refer the patient to Dr. Magnus Ivan for that discussion.  Follow-up with [place] as needed.

## 2020-04-07 DIAGNOSIS — M25552 Pain in left hip: Secondary | ICD-10-CM | POA: Diagnosis not present

## 2020-04-16 ENCOUNTER — Other Ambulatory Visit: Payer: Self-pay | Admitting: Sports Medicine

## 2020-04-16 DIAGNOSIS — M25552 Pain in left hip: Secondary | ICD-10-CM | POA: Diagnosis not present

## 2020-04-26 DIAGNOSIS — M25552 Pain in left hip: Secondary | ICD-10-CM | POA: Diagnosis not present

## 2020-05-04 DIAGNOSIS — F4323 Adjustment disorder with mixed anxiety and depressed mood: Secondary | ICD-10-CM | POA: Diagnosis not present

## 2020-05-04 DIAGNOSIS — Z13820 Encounter for screening for osteoporosis: Secondary | ICD-10-CM | POA: Diagnosis not present

## 2020-05-10 DIAGNOSIS — M25552 Pain in left hip: Secondary | ICD-10-CM | POA: Diagnosis not present

## 2020-05-19 DIAGNOSIS — F4323 Adjustment disorder with mixed anxiety and depressed mood: Secondary | ICD-10-CM | POA: Diagnosis not present

## 2020-05-19 DIAGNOSIS — M25552 Pain in left hip: Secondary | ICD-10-CM | POA: Diagnosis not present

## 2020-06-15 DIAGNOSIS — F4323 Adjustment disorder with mixed anxiety and depressed mood: Secondary | ICD-10-CM | POA: Diagnosis not present

## 2020-07-10 ENCOUNTER — Other Ambulatory Visit: Payer: Self-pay | Admitting: Sports Medicine

## 2020-07-15 ENCOUNTER — Other Ambulatory Visit: Payer: Self-pay | Admitting: Sports Medicine

## 2020-07-15 MED ORDER — MELOXICAM 15 MG PO TABS
ORAL_TABLET | ORAL | 2 refills | Status: DC
Start: 2020-07-15 — End: 2020-10-12

## 2020-08-10 DIAGNOSIS — F4323 Adjustment disorder with mixed anxiety and depressed mood: Secondary | ICD-10-CM | POA: Diagnosis not present

## 2020-08-23 DIAGNOSIS — F4323 Adjustment disorder with mixed anxiety and depressed mood: Secondary | ICD-10-CM | POA: Diagnosis not present

## 2020-08-25 ENCOUNTER — Encounter: Payer: Self-pay | Admitting: Family Medicine

## 2020-09-07 ENCOUNTER — Ambulatory Visit: Payer: BC Managed Care – PPO | Admitting: Sports Medicine

## 2020-09-08 ENCOUNTER — Ambulatory Visit: Payer: Self-pay

## 2020-09-08 ENCOUNTER — Encounter: Payer: Self-pay | Admitting: Family Medicine

## 2020-09-08 ENCOUNTER — Ambulatory Visit: Payer: BC Managed Care – PPO | Admitting: Family Medicine

## 2020-09-08 ENCOUNTER — Other Ambulatory Visit: Payer: Self-pay

## 2020-09-08 VITALS — Ht 64.5 in | Wt 135.0 lb

## 2020-09-08 DIAGNOSIS — M79671 Pain in right foot: Secondary | ICD-10-CM | POA: Diagnosis not present

## 2020-09-08 NOTE — Patient Instructions (Addendum)
You have severe metatarsalgia of your right foot. Ice 15 minutes at a time as needed. Take the meloxicam daily. Metatarsal pads/cookies are the long term important part of this  Follow up with me in 1 month (but call me in next 1-2 weeks if you're not improving as expected).

## 2020-09-08 NOTE — Progress Notes (Signed)
PCP: Deeann Saint, MD  Subjective:   HPI: Patient is a 60 y.o. female here for right foot pain.  Patient reports about 10 days of fairly severe right forefoot pain. Worse with standing, walking. Associated numbness, burning forefoot into digits. History of bunionectomy this right foot. She is on her feet a lot for work between [employer] and [employer]. No acute injury. Has shoes with metatarsal pads though does not have these currently.  Past Medical History:  Diagnosis Date   Fibromyalgia    H/O fatigue    Insomnia    PONV (postoperative nausea and vomiting)    severe    Current Outpatient Medications on File Prior to Visit  Medication Sig Dispense Refill   cetirizine-pseudoephedrine (ZYRTEC-D) 5-120 MG tablet One twice daily for congestion 20 tablet 1   cyclobenzaprine (FLEXERIL) 10 MG tablet Take 1 tablet (10 mg total) by mouth at bedtime as needed for muscle spasms. 30 tablet 0   ESTERIFIED ESTROGENS PO Take by mouth.     magnesium gluconate (MAGONATE) 500 MG tablet Take 500 mg by mouth 2 (two) times daily.     meloxicam (MOBIC) 15 MG tablet 1 po qd for 7 to 10 days as needed 30 tablet 2   Multiple Vitamins-Minerals (MULTI COMPLETE PO) Take by mouth.     Progesterone Micronized (PROGESTERONE PO) Take by mouth.     No current facility-administered medications on file prior to visit.    Past Surgical History:  Procedure Laterality Date   CESAREAN SECTION  1995   CESAREAN SECTION  2000   DILATATION & CURETTAGE/HYSTEROSCOPY WITH TRUECLEAR N/A 01/01/2013   Procedure: DILATATION & CURETTAGE/HYSTEROSCOPY WITH TRUCLEAR;  Surgeon: Meriel Pica, MD;  Location: [facility];  Service: Gynecology;  Laterality: N/A;   SHOULDER SURGERY  05/03/2010   Left shoulder surgery, Dr.Doldorf     TMJ ARTHROPLASTY  1990    No Known Allergies  Ht 5' 4.5" (1.638 m)   Wt 135 lb (61.2 kg)   LMP 12/12/2012   BMI 22.81 kg/m   [facility] 09/23/2019 11/21/2019  03/29/2020 09/08/2020  Frequency of aerobic exercise (# of days/week) 1 1 2 1   Average time in minutes 45 45 60 30  Frequency of strengthening activities (# of days/week) 2 2 - 2    No flowsheet data found.      Objective:  Physical Exam:  Gen: NAD, comfortable in exam room  Right foot/ankle: Transverse arch collapse without callus.  No other gross deformity, swelling, ecchymoses FROM. TTP plantar 2nd-4th MT heads. Negative ant drawer and negative talar tilt.   Negative syndesmotic compression. Positive metatarsal squeeze. Thompsons test negative. NV intact distally.   Limited MSK u/s right foot:  No cortical irregularity, edema overlying cortex of 2nd-4th metatarsals, neovascularity.  Assessment & Plan:  1. Right foot pain - 2/2 metatarsalgia.  No evidence stress fracture on ultrasound.  Metatarsal cookie placed right side.  Icing, meloxicam.  F/u in 1 month.

## 2020-09-10 ENCOUNTER — Telehealth: Payer: Self-pay | Admitting: Family Medicine

## 2020-09-10 NOTE — Telephone Encounter (Signed)
Patient is requesting for z pack to be called in because she is coughing up yellow. She tested negative for covid.  Patient can be contacted at [phone].  Pharmacy is [id number] - [place], [place] - [address]  [address], [place] [id number]   Please advise.

## 2020-09-10 NOTE — Telephone Encounter (Signed)
Spoke with pt, advised she would need to come in for pcp to prescribe anything. Also informed her of the reply to her last MyChart message, pt was advised to come in per PCP about her concerns. Patient denied an appointment, stated she does not have time to come in and will just wait until her appointment next Friday.

## 2020-09-17 ENCOUNTER — Other Ambulatory Visit: Payer: Self-pay

## 2020-09-17 ENCOUNTER — Ambulatory Visit (INDEPENDENT_AMBULATORY_CARE_PROVIDER_SITE_OTHER): Payer: BC Managed Care – PPO | Admitting: Family Medicine

## 2020-09-17 ENCOUNTER — Encounter: Payer: Self-pay | Admitting: Family Medicine

## 2020-09-17 VITALS — BP 132/98 | HR 67 | Temp 98.6°F | Ht 63.25 in | Wt 137.8 lb

## 2020-09-17 DIAGNOSIS — E782 Mixed hyperlipidemia: Secondary | ICD-10-CM

## 2020-09-17 DIAGNOSIS — Z0000 Encounter for general adult medical examination without abnormal findings: Secondary | ICD-10-CM

## 2020-09-17 DIAGNOSIS — I77810 Thoracic aortic ectasia: Secondary | ICD-10-CM | POA: Diagnosis not present

## 2020-09-17 DIAGNOSIS — R635 Abnormal weight gain: Secondary | ICD-10-CM

## 2020-09-17 DIAGNOSIS — Z0001 Encounter for general adult medical examination with abnormal findings: Secondary | ICD-10-CM

## 2020-09-17 DIAGNOSIS — I2721 Secondary pulmonary arterial hypertension: Secondary | ICD-10-CM

## 2020-09-17 DIAGNOSIS — J0100 Acute maxillary sinusitis, unspecified: Secondary | ICD-10-CM

## 2020-09-17 DIAGNOSIS — K449 Diaphragmatic hernia without obstruction or gangrene: Secondary | ICD-10-CM

## 2020-09-17 DIAGNOSIS — B009 Herpesviral infection, unspecified: Secondary | ICD-10-CM

## 2020-09-17 LAB — LIPID PANEL
Cholesterol: 176 mg/dL (ref 0–200)
HDL: 53.6 mg/dL (ref >39.00)
LDL Cholesterol: 105 mg/dL — ABNORMAL HIGH (ref 0–99)
NonHDL: 122.23
Total CHOL/HDL Ratio: 3
Triglycerides: 85 mg/dL (ref 0.0–149.0)
VLDL: 17 mg/dL (ref 0.0–40.0)

## 2020-09-17 LAB — T4, FREE: Free T4: 0.9 ng/dL (ref 0.60–1.60)

## 2020-09-17 LAB — CBC WITH DIFFERENTIAL/PLATELET
Basophils Absolute: 0.1 10*3/uL (ref 0.0–0.1)
Basophils Relative: 0.9 % (ref 0.0–3.0)
Eosinophils Absolute: 0.2 10*3/uL (ref 0.0–0.7)
Eosinophils Relative: 3.3 % (ref 0.0–5.0)
HCT: 37.2 % (ref 36.0–46.0)
Hemoglobin: 12.2 g/dL (ref 12.0–15.0)
Lymphocytes Relative: 34.2 % (ref 12.0–46.0)
Lymphs Abs: 2.3 10*3/uL (ref 0.7–4.0)
MCHC: 32.8 g/dL (ref 30.0–36.0)
MCV: 91.1 fl (ref 78.0–100.0)
Monocytes Absolute: 0.5 10*3/uL (ref 0.1–1.0)
Monocytes Relative: 7.3 % (ref 3.0–12.0)
Neutro Abs: 3.6 10*3/uL (ref 1.4–7.7)
Neutrophils Relative %: 54.3 % (ref 43.0–77.0)
Platelets: 224 10*3/uL (ref 150.0–400.0)
RBC: 4.08 Mil/uL (ref 3.87–5.11)
RDW: 13 % (ref 11.5–15.5)
WBC: 6.7 10*3/uL (ref 4.0–10.5)

## 2020-09-17 LAB — BASIC METABOLIC PANEL
BUN: 20 mg/dL (ref 6–23)
CO2: 30 mEq/L (ref 19–32)
Calcium: 9.5 mg/dL (ref 8.4–10.5)
Chloride: 103 mEq/L (ref 96–112)
Creatinine, Ser: 0.88 mg/dL (ref 0.40–1.20)
GFR: 70.43 mL/min (ref >60.00)
Glucose, Bld: 80 mg/dL (ref 70–99)
Potassium: 4.1 mEq/L (ref 3.5–5.1)
Sodium: 140 mEq/L (ref 135–145)

## 2020-09-17 LAB — HEMOGLOBIN A1C: Hgb A1c MFr Bld: 5.5 % (ref 4.6–6.5)

## 2020-09-17 LAB — TSH: TSH: 2.03 u[IU]/mL (ref 0.35–5.50)

## 2020-09-17 MED ORDER — FLUTICASONE PROPIONATE 50 MCG/ACT NA SUSP: 1.0000 | Freq: Every day | NASAL | 0 refills | Status: DC

## 2020-09-17 MED ORDER — AMOXICILLIN-POT CLAVULANATE 500-125 MG PO TABS: 1.0000 | ORAL_TABLET | Freq: Two times a day (BID) | ORAL | 0 refills | Status: AC

## 2020-09-17 MED ORDER — VALACYCLOVIR HCL 500 MG PO TABS: ORAL_TABLET | ORAL | 1 refills | Status: DC

## 2020-09-17 NOTE — Progress Notes (Signed)
Subjective:     LILLYANN AHART is a 60 y.o. female and is here for a comprehensive physical exam. The patient reports problems - intermittent L rib/side pain, L ear pruritis, and a L sided facial fullness .   Pt notes healing of L rib fx reported, but still having intermittent discomfort on that side.  Also told she had a small hiatal hernia. Has appointment with OB/GYN coming up.  Inquires about refill of Valtrex for intermittent outbreaks.  Patient endorses fluctuating weight.  Will lose 10 pounds but regain it.  Patient inquires about wegovy as she heard it works wonders for central obesity.  Patient active working out regularly and eating healthy.  Pt mentions her older brother who is 53 has some type of "aneurysm on heart" and her sister who is 53 has the same thing as she has.  Patient cannot recall what she was told she had.  Social History   Socioeconomic History   Marital status: Single    Spouse name: Not on file   Number of children: 2   Years of education: COLLEGE   Highest education level: Not on file  Occupational History   Occupation: [employer]    Employer: [employer]  Tobacco Use   Smoking status: Former   Smokeless tobacco: Never   Tobacco comments:    Social smoker quit at age 38  Substance and Sexual Activity   Alcohol use: Yes    Alcohol/week: 0.0 standard drinks    Comment: 2 GLASSES WINE DAILY    Drug use: No   Sexual activity: Not on file  Other Topics Concern   Not on file  Social History Narrative   Lives at home w/ her fiance    Right-handed   Caffeine: 1 cup of coffee per day   Social Determinants of Health   Financial Resource Strain: Not on file  Food Insecurity: Not on file  Transportation Needs: Not on file  Physical Activity: Not on file  Stress: Not on file  Social Connections: Not on file  Intimate Partner Violence: Not on file   Health Maintenance  Topic Date Due   HIV Screening  Never done   Hepatitis C Screening  Never  done   COLONOSCOPY (Pts 45-109yrs Insurance coverage will need to be confirmed)  Never done   MAMMOGRAM  08/30/2016   PAP SMEAR-Modifier  09/16/2017   COVID-19 Vaccine (4 - Booster for Pfizer series) 02/10/2020   INFLUENZA VACCINE  08/09/2020   TETANUS/TDAP  06/09/2024   Zoster Vaccines- Shingrix  Completed   Pneumococcal Vaccine 16-65 Years old  Aged Out   HPV VACCINES  Aged Out    The following portions of the patient's history were reviewed and updated as appropriate: allergies, current medications, past family history, past medical history, past social history, past surgical history, and problem list.  Review of Systems Pertinent items noted in HPI and remainder of comprehensive ROS otherwise negative.   Objective:    BP (!) 132/98 (BP Location: Right Arm, Patient Position: Sitting, Cuff Size: Normal)   Pulse 67   Temp 98.6 F (37 C) (Oral)   Ht 5' 3.25" (1.607 m)   Wt 137 lb 12.8 oz (62.5 kg)   LMP 12/12/2012   SpO2 99%   BMI 24.22 kg/m  General appearance: alert, cooperative, and no distress Head: Normocephalic, without obvious abnormality, atraumatic Eyes: conjunctivae/corneas clear. PERRL, EOM's intact. Fundi benign. Ears: normal TM's and external ear canals both ears Nose: Nares normal.  Septum midline. Mucosa normal. No drainage.  Mild L maxillary sinus tenderness. Throat: lips, mucosa, and tongue normal; teeth and gums normal Neck: no adenopathy, no carotid bruit, no JVD, supple, symmetrical, trachea midline, and thyroid not enlarged, symmetric, no tenderness/mass/nodules Lungs: clear to auscultation bilaterally Heart: regular rate and rhythm, S1, S2 normal, no murmur, click, rub or gallop Abdomen: soft, non-tender; bowel sounds normal; no masses,  no organomegaly Extremities: extremities normal, atraumatic, no cyanosis or edema Pulses: 2+ and symmetric Skin: Skin color, texture, turgor normal. No rashes or lesions Lymph nodes: Cervical, supraclavicular, and axillary  nodes normal. Neurologic: Alert and oriented X 3, normal strength and tone. Normal symmetric reflexes. Normal coordination and gait    Assessment:    Healthy female exam.      Plan:   Anticipatory guidance given including wearing seatbelts, smoke detectors in the home, increasing physical activity, increasing p.o. intake of water and vegetables. -labs -Colonoscopy done 06/07/2016 -Pt schedule mammogram -Has upcoming appointment with OB/GYN -Discussed immunizations -Given handout -Next CPE in 1 year See After Visit Summary for Counseling Recommendations   Subacute maxillary sinusitis  - Plan: CBC with Differential/Platelet, amoxicillin-clavulanate (AUGMENTIN) 500-125 MG tablet, fluticasone (FLONASE) 50 MCG/ACT nasal spray  Hiatal hernia  Dilation of thoracic aorta (HCC) -Mild dilation (3.1 cm in greatest dimension) of thoracic aortic arch noted on CT from 03/12/2020. -Annual imaging follow-up by CTA or MRA recommended.  Pulmonary artery hypertension (HCC) -Noted on CT from 03/12/2020 mild pulmonary arterial enlargement in keeping with changes of pulmonary arterial hypertension. -Advised to continue follow-up with cardiology. If needed will place new referral.  HSV infection  - Plan: valACYclovir (VALTREX) 500 MG tablet  Weight gain -BMI normal -Continue lifestyle modifications - Plan: TSH, T4, Free, Hemoglobin A1c  Mixed hyperlipidemia  - Plan: Lipid panel  Follow-up in the next 2-3 months, sooner if needed  Abbe Amsterdam, MD

## 2020-09-22 ENCOUNTER — Encounter: Payer: Self-pay | Admitting: Family Medicine

## 2020-09-29 DIAGNOSIS — Z01419 Encounter for gynecological examination (general) (routine) without abnormal findings: Secondary | ICD-10-CM | POA: Diagnosis not present

## 2020-09-29 DIAGNOSIS — Z6824 Body mass index (BMI) 24.0-24.9, adult: Secondary | ICD-10-CM | POA: Diagnosis not present

## 2020-10-04 ENCOUNTER — Other Ambulatory Visit: Payer: Self-pay | Admitting: Sports Medicine

## 2020-10-04 DIAGNOSIS — Z8781 Personal history of (healed) traumatic fracture: Secondary | ICD-10-CM | POA: Diagnosis not present

## 2020-10-04 DIAGNOSIS — M9902 Segmental and somatic dysfunction of thoracic region: Secondary | ICD-10-CM | POA: Diagnosis not present

## 2020-10-04 DIAGNOSIS — S73192A Other sprain of left hip, initial encounter: Secondary | ICD-10-CM | POA: Diagnosis not present

## 2020-10-04 DIAGNOSIS — M25552 Pain in left hip: Secondary | ICD-10-CM | POA: Diagnosis not present

## 2020-10-11 ENCOUNTER — Ambulatory Visit: Payer: BC Managed Care – PPO | Admitting: Family Medicine

## 2020-10-11 DIAGNOSIS — M25552 Pain in left hip: Secondary | ICD-10-CM | POA: Diagnosis not present

## 2020-10-11 DIAGNOSIS — M9902 Segmental and somatic dysfunction of thoracic region: Secondary | ICD-10-CM | POA: Diagnosis not present

## 2020-10-11 DIAGNOSIS — S73192D Other sprain of left hip, subsequent encounter: Secondary | ICD-10-CM | POA: Diagnosis not present

## 2020-10-11 DIAGNOSIS — Z8781 Personal history of (healed) traumatic fracture: Secondary | ICD-10-CM | POA: Diagnosis not present

## 2020-10-25 DIAGNOSIS — M25552 Pain in left hip: Secondary | ICD-10-CM | POA: Diagnosis not present

## 2020-10-25 DIAGNOSIS — S73192D Other sprain of left hip, subsequent encounter: Secondary | ICD-10-CM | POA: Diagnosis not present

## 2020-10-25 DIAGNOSIS — M9906 Segmental and somatic dysfunction of lower extremity: Secondary | ICD-10-CM | POA: Diagnosis not present

## 2020-11-13 ENCOUNTER — Other Ambulatory Visit: Payer: Self-pay | Admitting: Family Medicine

## 2020-11-13 DIAGNOSIS — J0100 Acute maxillary sinusitis, unspecified: Secondary | ICD-10-CM

## 2020-12-24 DIAGNOSIS — Z1231 Encounter for screening mammogram for malignant neoplasm of breast: Secondary | ICD-10-CM | POA: Diagnosis not present

## 2020-12-24 LAB — HM MAMMOGRAPHY

## 2020-12-29 ENCOUNTER — Encounter: Payer: Self-pay | Admitting: Family Medicine

## 2021-01-13 DIAGNOSIS — H2513 Age-related nuclear cataract, bilateral: Secondary | ICD-10-CM | POA: Diagnosis not present

## 2021-01-20 ENCOUNTER — Ambulatory Visit (INDEPENDENT_AMBULATORY_CARE_PROVIDER_SITE_OTHER): Payer: BC Managed Care – PPO | Admitting: Sports Medicine

## 2021-01-20 DIAGNOSIS — M7582 Other shoulder lesions, left shoulder: Secondary | ICD-10-CM

## 2021-01-20 NOTE — Assessment & Plan Note (Signed)
With her calcific tendinitis we decided to try a session of ESWT  Session 1: ESWT to left supraspinatus Power 80 Large head Frequency 16 Directed 2200 impulses to the supraspinatus insertion to the footplate  Pateint tolerated this well when power reduced to 80 Improved elevation and motion with less clicking noted  Suggest weekly EST ~ 2500 impulses Try to increase power level by 10 per week to 120

## 2021-01-20 NOTE — Progress Notes (Signed)
° °  Subjective:    Patient ID: Patricia Lloyd, female    DOB: 05/14/1960, 61 y.o.   MRN: [id number]  HPI Patient s/p bilateral rotator cuff surgery presents with left shoulder and hip pain. Left shoulder pain has been ongoing since then intermittently. Reports that it is sore to touch and was told that she was having more calcifications. States that she has been working out more and sometimes feels that her shoulder freezes, it feels like she gets stuck so she would like an update as she feels like it is getting more stiff. Also reports that shoulder is painful to touch and with certain movements. Denies associated numbness or tingling sensation. Regarding her hip pain, she reports that this has improved. She has been participating in physical therapy up until November which she states helps a lot. 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 Pilates has also helped improve the pain. It hurts the most when she sleep on her left side. Incidentally noted to have rib fracture in the past, states that since then her left side "just doesn't feel right." Pain radiates down her left leg to her knee.   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 MSK:  Left shoulder Inspection reveals no gross deformity, erythema, ecchymosis or edema. Palpation reveals tenderness along left inferior deltoid.  ROM: full passive ROM, limited active abduction and crepitus noted with extension and external rotation.  Special testing: negative Hawkin's and Neer, negative Speeds and Yergasons, negative drop arm and empty can testing   Left hip Inspection reveals no gross deformity. No evidence of edema or erythema noted,. Palpation reveals  tenderness along left hip flexors. Some TTP at GT ROM: full active and passive ROM along hip and knee joints bilaterally  Special testing: positive straight leg raise on the left, negative FADIR and FABIR testing  Neuro: gross sensation intact, 5'5 grip strength, 5/5 UE and LE strength bilaterally     Assessment & Plan:  Calcific tendiopathy of the left shoulder: Shockwave therapy performed today with notable improvement in active ROM and decreased crepitus. Instructed to follow up for further sessions. Left hip pain: Possibly due to gluteus medius syndrome-- encouraged to continue PT and stretching exercises.  I observed and examined the patient with the resident and agree with assessment and plan.  Note reviewed and modified by me. Sterling Big, MD

## 2021-03-02 DIAGNOSIS — E785 Hyperlipidemia, unspecified: Secondary | ICD-10-CM | POA: Insufficient documentation

## 2021-03-02 NOTE — Progress Notes (Unsigned)
Cardiology Office Note   Date:  03/02/2021   ID:  Patricia Lloyd, DOB [birth year], MRN [id number]  PCP:  Billie Ruddy, MD  Cardiologist:   None Referring:  Billie Ruddy, MD  No chief complaint on file.     History of Present Illness: Patricia Lloyd is a 61 y.o. female who I saw in 2021 for evaluation of chest pain, dizziness and palpitations.   She also had chest pain and had a zero calcium score and negative POET (Plain Old Exercise Treadmill).   ***     ***  s referred by Billie Ruddy, MD for evaluation of chest discomfort, dizziness and palpitations.  The patient reports that she gets episodes of feeling like her heart is skipping.  This has been going on for a year but seems to be worse with increased frequency happening a few times per week.  She notices it mostly at night.  She has other symptoms such as chest discomfort.  She had a severe episode while driving home not long ago.  It was 4 out of 10 in intensity.  It lasted about 40 minutes.  It was substernal.  It eventually went away.  She did not describe radiation to her jaw or to her arms.  She has not had this kind of pain before.  She gets some pinching discomfort in her pain occasionally.  She has sensations like her breath is catching.  She is not able to bring this on.  She does have dizziness and has been seen by neurology.  She actually had syncope a couple of years ago that was unexplained.  She is recently had a brother who was told that he had an enlarged aorta.  She is physically active but has some decreased exercise tolerance.  She is not had any resting shortness of breath, PND or orthopnea.  Has had no cough fevers or chills.   Past Medical History:  Diagnosis Date   Fibromyalgia    H/O fatigue    Insomnia    PONV (postoperative nausea and vomiting)    severe    Past Surgical History:  Procedure Laterality Date   CESAREAN SECTION  1995   CESAREAN SECTION  2000   DILATATION &  CURETTAGE/HYSTEROSCOPY WITH TRUECLEAR N/A 01/01/2013   Procedure: DILATATION & CURETTAGE/HYSTEROSCOPY WITH TRUCLEAR;  Surgeon: Margarette Asal, MD;  Location: [facility];  Service: Gynecology;  Laterality: N/A;   SHOULDER SURGERY  05/03/2010   Left shoulder surgery, Dr.Doldorf     TMJ ARTHROPLASTY  1990     Current Outpatient Medications  Medication Sig Dispense Refill   cetirizine-pseudoephedrine (ZYRTEC-D) 5-120 MG tablet One twice daily for congestion 20 tablet 1   ESTERIFIED ESTROGENS PO Take by mouth.     fluticasone (FLONASE) 50 MCG/ACT nasal spray SHAKE LIQUID AND USE 1 SPRAY IN EACH NOSTRIL DAILY 16 g 1   magnesium gluconate (MAGONATE) 500 MG tablet Take 500 mg by mouth 2 (two) times daily.     meloxicam (MOBIC) 15 MG tablet TAKE 1 TABLET BY MOUTH EVERY DAY FOR 7 TO 10 DAYS AS NEEDED 90 tablet 0   Multiple Vitamins-Minerals (MULTI COMPLETE PO) Take by mouth.     Progesterone Micronized (PROGESTERONE PO) Take by mouth.     valACYclovir (VALTREX) 500 MG tablet Take 1 tab twice daily as needed for 5 days at first signs of symptoms. 30 tablet 1   No current facility-administered medications for this visit.  Allergies:   Patient has no known allergies.    ROS:  Please see the history of present illness.   Otherwise, review of systems are positive for ***.   All other systems are reviewed and negative.    PHYSICAL EXAM: VS:  LMP 12/12/2012  , BMI There is no height or weight on file to calculate BMI. GENERAL:  Well appearing NECK:  No jugular venous distention, waveform within normal limits, carotid upstroke brisk and symmetric, no bruits, no thyromegaly LUNGS:  Clear to auscultation bilaterally CHEST:  Unremarkable HEART:  PMI not displaced or sustained,S1 and S2 within normal limits, no S3, no S4, no clicks, no rubs, *** murmurs ABD:  Flat, positive bowel sounds normal in frequency in pitch, no bruits, no rebound, no guarding, no midline pulsatile mass, no hepatomegaly, no  splenomegaly EXT:  2 plus pulses throughout, no edema, no cyanosis no clubbing    ***GENERAL:  Well appearing HEENT:  Pupils equal round and reactive, fundi not visualized, oral mucosa unremarkable NECK:  No jugular venous distention, waveform within normal limits, carotid upstroke brisk and symmetric, no bruits, no thyromegaly LYMPHATICS:  No cervical, inguinal adenopathy LUNGS:  Clear to auscultation bilaterally BACK:  No CVA tenderness CHEST:  Unremarkable HEART:  PMI not displaced or sustained,S1 and S2 within normal limits, no S3, no S4, no clicks, no rubs, no murmurs ABD:  Flat, positive bowel sounds normal in frequency in pitch, no bruits, no rebound, no guarding, no midline pulsatile mass, no hepatomegaly, no splenomegaly EXT:  2 plus pulses throughout, no edema, no cyanosis no clubbing SKIN:  No rashes no nodules NEURO:  Cranial nerves II through XII grossly intact, motor grossly intact throughout PSYCH:  Cognitively intact, oriented to person place and time    EKG:  EKG is *** ordered today. The ekg ordered today demonstrates sinus rhythm, rate ***, axis within normal limits, intervals within normal limits, poor anterior R wave progression, no acute ST-T wave changes.   Recent Labs: 09/17/2020: BUN 20; Creatinine, Ser 0.88; Hemoglobin 12.2; Platelets 224.0; Potassium 4.1; Sodium 140; TSH 2.03    Lipid Panel    Component Value Date/Time   CHOL 176 09/17/2020 1034   CHOL 182 05/29/2014 1038   TRIG 85.0 09/17/2020 1034   HDL 53.60 09/17/2020 1034   HDL 79 05/29/2014 1038   CHOLHDL 3 09/17/2020 1034   VLDL 17.0 09/17/2020 1034   LDLCALC 105 (H) 09/17/2020 1034   LDLCALC 93 05/29/2014 1038      Wt Readings from Last 3 Encounters:  01/20/21 138 lb (62.6 kg)  09/17/20 137 lb 12.8 oz (62.5 kg)  09/08/20 135 lb (61.2 kg)      Other studies Reviewed: Additional studies/ records that were reviewed today include: ***. Review of the above records demonstrates:  Please  see elsewhere in the note.     ASSESSMENT AND PLAN:  CHEST PAIN:   ***  The patient has some discomfort that has typical and atypical features. I will bring the patient back for a POET (Plain Old Exercise Test). This will allow me to screen for obstructive coronary disease, risk stratify and very importantly provide a prescription for exercise..  I would also like for her to get a coronary calcium score.  Further management will be based on these results.  PALPITATIONS: ***   I am going to have her get a 2-week event monitor.  DYSLIPIDEMIA: Her LDL was *** 122 couple of years ago.  Goals of therapy will be based  on the results of the testing above.   DIZZINESS:  ***  This will be evaluated as above.   Current medicines are reviewed at length with the patient today.  The patient does not have concerns regarding medicines.  The following changes have been made:  ***  Labs/ tests ordered today include: ***  No orders of the defined types were placed in this encounter.    Disposition:   FU with me ***   Signed, Minus Breeding, MD  03/02/2021 8:13 PM    [facility]

## 2021-03-03 ENCOUNTER — Ambulatory Visit: Payer: BC Managed Care – PPO | Admitting: Cardiology

## 2021-03-03 DIAGNOSIS — R42 Dizziness and giddiness: Secondary | ICD-10-CM

## 2021-03-03 DIAGNOSIS — R072 Precordial pain: Secondary | ICD-10-CM

## 2021-03-03 DIAGNOSIS — E785 Hyperlipidemia, unspecified: Secondary | ICD-10-CM

## 2021-03-03 DIAGNOSIS — R002 Palpitations: Secondary | ICD-10-CM

## 2021-03-09 DIAGNOSIS — F4323 Adjustment disorder with mixed anxiety and depressed mood: Secondary | ICD-10-CM | POA: Diagnosis not present

## 2021-03-16 NOTE — Progress Notes (Signed)
?  ?Cardiology Office Note ? ? ?Date:  03/17/2021  ? ?ID:  Patricia Lloyd, DOB [birth year], MRN [id number] ? ?PCP:  Deeann Saint, MD  ?Cardiologist:   None ?Referring:  Deeann Saint, MD ? ?Chief Complaint  ?Patient presents with  ? Chest Pain  ? ? ?  ?History of Present Illness: ?Patricia Lloyd is a 61 y.o. female who I saw in 2021 for evaluation of chest pain, dizziness and palpitations.   She also had chest pain and had a zero calcium score and negative POET (Plain Old Exercise Treadmill).   She previously had a monitor that demonstrated no arrhythmias. ? ?Recently she had a CT was done because of some rib pain she had a rib fracture.  This suggested that the central pulmonary artery was mildly enlarged and that it could be pulmonary hypertension.  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  She has a family history of apparently aortic dilatation.  She still gets some pain and actually really is chronic on the left side.  This is what was evaluated with her negative treadmill test.  This is constant and not made worse with physical exercise.  She does exercise a couple times per week but has a somewhat sedentary job.  She does not have any new shortness of breath, PND or orthopnea.  She has occasional palpitations unchanged from previous without any presyncope or syncope.  She has a little right leg swelling. ? ? ?Past Medical History:  ?Diagnosis Date  ? Fibromyalgia   ? H/O fatigue   ? Insomnia   ? PONV (postoperative nausea and vomiting)   ? severe  ? ? ?Past Surgical History:  ?Procedure Laterality Date  ? CESAREAN SECTION  1995  ? CESAREAN SECTION  2000  ? DILATATION & CURETTAGE/HYSTEROSCOPY WITH TRUECLEAR N/A 01/01/2013  ? Procedure: DILATATION & CURETTAGE/HYSTEROSCOPY WITH TRUCLEAR;  Surgeon: Meriel Pica, MD;  Location: [facility];  Service: Gynecology;  Laterality: N/A;  ? SHOULDER SURGERY  05/03/2010  ? Left shoulder surgery, Dr.Doldorf    ? TMJ ARTHROPLASTY  1990  ? ? ? ?Current  Outpatient Medications  ?Medication Sig Dispense Refill  ? cetirizine-pseudoephedrine (ZYRTEC-D) 5-120 MG tablet One twice daily for congestion 20 tablet 1  ? ESTERIFIED ESTROGENS PO Take by mouth.    ? magnesium gluconate (MAGONATE) 500 MG tablet Take 500 mg by mouth 2 (two) times daily.    ? meloxicam (MOBIC) 15 MG tablet TAKE 1 TABLET BY MOUTH EVERY DAY FOR 7 TO 10 DAYS AS NEEDED 90 tablet 0  ? Multiple Vitamins-Minerals (MULTI COMPLETE PO) Take by mouth.    ? Progesterone Micronized (PROGESTERONE PO) Take by mouth.    ? valACYclovir (VALTREX) 500 MG tablet Take 1 tab twice daily as needed for 5 days at first signs of symptoms. 30 tablet 1  ? ?No current facility-administered medications for this visit.  ? ? ?Allergies:   Patient has no known allergies.  ? ? ?ROS:  Please see the history of present illness.   Otherwise, review of systems are positive for positive for mild discomfort in the right upper quadrant.   All other systems are reviewed and negative.  ? ? ?PHYSICAL EXAM: ?VS:  BP 126/76   Pulse (!) 56   Ht 5\' 4"  (1.626 m)   Wt 143 lb 9.6 oz (65.1 kg)   LMP 12/12/2012   SpO2 99%   BMI 24.65 kg/m?  , BMI Body mass index is 24.65 kg/m?. ?GENERAL:  Well appearing ?NECK:  No jugular venous distention, waveform within normal limits, carotid upstroke brisk and symmetric, no bruits, no thyromegaly ?LUNGS:  Clear to auscultation bilaterally ?CHEST:  Unremarkable ?HEART:  PMI not displaced or sustained,S1 and S2 within normal limits, no S3, no S4, no clicks, no rubs, no murmurs ?ABD:  Flat, positive bowel sounds normal in frequency in pitch, no bruits, no rebound, no guarding, no midline pulsatile mass, no hepatomegaly, no splenomegaly ?EXT:  2 plus pulses throughout, no edema, no cyanosis no clubbing ? ? ?EKG:  EKG is  ordered today. ?The ekg ordered today demonstrates sinus rhythm, rate 56, axis within normal limits, intervals within normal limits, poor anterior R wave progression, no acute ST-T wave  changes. ? ? ?Recent Labs: ?09/17/2020: BUN 20; Creatinine, Ser 0.88; Hemoglobin 12.2; Platelets 224.0; Potassium 4.1; Sodium 140; TSH 2.03  ? ? ?Lipid Panel ?   ?Component Value Date/Time  ? CHOL 176 09/17/2020 1034  ? CHOL 182 05/29/2014 1038  ? TRIG 85.0 09/17/2020 1034  ? HDL 53.60 09/17/2020 1034  ? HDL 79 05/29/2014 1038  ? CHOLHDL 3 09/17/2020 1034  ? VLDL 17.0 09/17/2020 1034  ? LDLCALC 105 (H) 09/17/2020 1034  ? LDLCALC 93 05/29/2014 1038  ? ?  ? ?Wt Readings from Last 3 Encounters:  ?03/17/21 143 lb 9.6 oz (65.1 kg)  ?01/20/21 138 lb (62.6 kg)  ?09/17/20 137 lb 12.8 oz (62.5 kg)  ?  ? ? ?Other studies Reviewed: ?Additional studies/ records that were reviewed today include: CT. ?Review of the above records demonstrates:  Please see elsewhere in the note.   ? ? ?ASSESSMENT AND PLAN: ? ?CHEST PAIN:    Patient's chest discomfort is unchanged from when she had her negative treadmill test and 0 calcium score.  I think the pretest probability of obstructive coronary disease is low and so no further cardiac testing is suggested.  We discussed at length again primary risk reduction.  ? ?PALPITATIONS: These are infrequent.  No change in therapy. ? ?DYSLIPIDEMIA: Her LDL was down to 105 from 122.  She will continue with diet and exercise.   ? ?ABNORMAL CT: She had a suggestion of elevated pressures and I will follow-up with an echocardiogram. ? ?AORTIC ENLARGEMENT:   This was very mild at 31 mm.  She can have a follow-up CT in 3 to 4 years.  I would ask her to schedule follow-up to make sure this happens and I be happy to facilitate that. ? ?Current medicines are reviewed at length with the patient today.  The patient does not have concerns regarding medicines. ? ?The following changes have been made: None ? ?Labs/ tests ordered today include:  ? ?Orders Placed This Encounter  ?Procedures  ? EKG 12-Lead  ? ECHOCARDIOGRAM COMPLETE  ? ? ? ?Disposition:   FU with me as above ? ? ?Signed, ?Rollene Rotunda, MD  ?03/17/2021  10:46 AM    ?[facility] ? ? ? ?

## 2021-03-17 ENCOUNTER — Other Ambulatory Visit: Payer: Self-pay

## 2021-03-17 ENCOUNTER — Encounter: Payer: Self-pay | Admitting: Cardiology

## 2021-03-17 ENCOUNTER — Ambulatory Visit: Payer: BC Managed Care – PPO | Admitting: Cardiology

## 2021-03-17 VITALS — BP 126/76 | HR 56 | Ht 64.0 in | Wt 143.6 lb

## 2021-03-17 DIAGNOSIS — E785 Hyperlipidemia, unspecified: Secondary | ICD-10-CM | POA: Diagnosis not present

## 2021-03-17 DIAGNOSIS — R072 Precordial pain: Secondary | ICD-10-CM | POA: Diagnosis not present

## 2021-03-17 DIAGNOSIS — R002 Palpitations: Secondary | ICD-10-CM

## 2021-03-17 NOTE — Patient Instructions (Signed)
Medication Instructions:  ?Your physician recommends that you continue on your current medications as directed. Please refer to the Current Medication list given to you today.  ? ?Labwork: ?NONE ? ?Testing/Procedures: ?Your physician has requested that you have an echocardiogram. Echocardiography is a painless test that uses sound waves to create images of your heart. It provides your doctor with information about the size and shape of your heart and how well your heart?s chambers and valves are working. This procedure takes approximately one hour. There are no restrictions for this procedure. ? ?Follow-Up: ?Your physician wants you to follow-up in: 2 YEARS  You will receive a reminder letter in the mail two months in advance. If you don't receive a letter, please call our office to schedule the follow-up appointment. ? ? ? ?

## 2021-04-01 ENCOUNTER — Other Ambulatory Visit (HOSPITAL_COMMUNITY): Payer: BC Managed Care – PPO

## 2021-04-15 ENCOUNTER — Ambulatory Visit (HOSPITAL_COMMUNITY): Payer: BC Managed Care – PPO | Attending: Cardiology

## 2021-04-15 DIAGNOSIS — R002 Palpitations: Secondary | ICD-10-CM | POA: Diagnosis not present

## 2021-04-15 DIAGNOSIS — R072 Precordial pain: Secondary | ICD-10-CM | POA: Diagnosis not present

## 2021-04-15 LAB — ECHOCARDIOGRAM COMPLETE
Area-P 1/2: 2.69 cm2
S' Lateral: 2.2 cm

## 2021-04-18 DIAGNOSIS — N950 Postmenopausal bleeding: Secondary | ICD-10-CM | POA: Diagnosis not present

## 2021-04-27 ENCOUNTER — Ambulatory Visit: Payer: BC Managed Care – PPO | Admitting: Family Medicine

## 2021-04-27 VITALS — BP 122/85 | Ht 64.5 in | Wt 140.0 lb

## 2021-04-27 DIAGNOSIS — M5416 Radiculopathy, lumbar region: Secondary | ICD-10-CM

## 2021-04-27 DIAGNOSIS — M5010 Cervical disc disorder with radiculopathy, unspecified cervical region: Secondary | ICD-10-CM | POA: Diagnosis not present

## 2021-04-27 MED ORDER — PREDNISONE 10 MG PO TABS: ORAL_TABLET | ORAL | 0 refills | Status: DC

## 2021-04-27 MED ORDER — KETOROLAC TROMETHAMINE 60 MG/2ML IM SOLN: 60.0000 mg | Freq: Once | INTRAMUSCULAR | Status: AC

## 2021-04-27 MED ORDER — METHYLPREDNISOLONE ACETATE 80 MG/ML IJ SUSP: 80.0000 mg | Freq: Once | INTRAMUSCULAR | Status: AC

## 2021-04-27 NOTE — Progress Notes (Signed)
? [name] is a 61 y.o. female who presents to [facility] today for the following: ? ?Low Back Pain ?Started suddenly over the weekend after moving heavy objects ?Pain has been worse on the left side than the right side ?Pain has been constant ?She has not had any improvement ?She has been having difficulty sleeping ?She tried muscle relaxers that did not provide any improvement, just made her sleepy ?She denies any saddle anesthesias, changes in bowel or bladder habits, leg weakness ?No numbness and tingling into the legs ?Has not noticed any radiation into her legs, however does have chronic left hip pain and pain that radiates into the groin ? ?Left Side pain ?Has had x-rays of her rib and seen multiple providers for this without improvement ?Pain is constant, sometimes worse than others ?Had x-rays in 2016 with mild DDD in cervical spine ?No recent x-rays ?Occasionally gets numbness, tingling ?Feels very tender under her arm near her armpit and into her ribs ?Hurts with movement of arm and neck as well ? ?She has a known labral tear of the left hip seen on prior MRI ? ? ?PMH reviewed.  ?ROS as above. ?Medications reviewed. ? ?Exam:  ?BP 122/85   Ht 5' 4.5" (1.638 m)   Wt 140 lb (63.5 kg)   LMP 12/12/2012   BMI 23.66 kg/m?  ?Gen: Well NAD ?MSK: ? ?Lumbar spine: ?- Inspection: no gross deformity or asymmetry, swelling or ecchymosis ?- Palpation: She has TTP over left>right SI joint as well as left>right paraspinal musculature.  No TTP over the spinous processes. ?- ROM: full active ROM of the lumbar spine in flexion and extension with mild pain worsening with flexion ?- Strength: 5/5 strength of lower extremity in L4-S1 nerve root distributions b/l; normal gait ?- Neuro: sensation intact in the L4-S1 nerve root distribution b/l, 2+ L4 and S1 reflexes ?- Special testing: Positive seated slump on left ? ?Neck/Back: ?- Inspection: no gross deformity or asymmetry, swelling or ecchymosis ?- Palpation: There  are tender points throughout the left chest wall/axilla, none in upper trapezius ?- ROM: full active ROM of the cervical spine with neck extension, rotation, flexion, sidebending, with some pain with rotation, sidebending, and flexion.  Movement of the right shoulder does exacerbate the pain at the side when it is overheard. ?- Strength: 5/5 wrist flexion, extension, biceps flexion, triceps extension. OK sign, interosseus strength intact  ?- Neuro: sensation intact in the C5-C8 nerve root distribution b/l, 2+ C5-C7 reflexes ? ? ? ? ? ?Assessment and Plan: ?1) Lumbar radiculopathy ?For the low back pain that could be consistent with discogenic pathology given findings on examination.  Could also consider degenerative process.  Given the severity of her pain, we discussed intramuscular injections today and she opts to proceed.  IM Toradol 60 mg and IM Depo-Medrol 80 mg were performed today.  Given that the patient does not live close, also provided a prescription for prednisone Dosepak for her to take over the next 2 days if she is not having improvement in her pain.  We will obtain lumbar spine films to assess level of degeneration.  Follow-up in 2 to 4 weeks. ? ?Cervical disc disorder with radiculopathy of cervical region ?Patient's chronic pain has not been explained by investigating the region of the ribs where she is most tender.  There is a possibility that this could be a radicular pattern coming from the neck, especially given the findings on her examination.  We will obtain a cervical  spine x-ray to evaluate for possible degenerative changes and proceed with an MRI as needed pending these changes.  We will have her follow-up in 2 to 4 weeks to discuss this further. ? ? ?Luis Abed, D.O.  ?PGY-4 [facility]  ?04/27/2021 5:11 PM ? ?Addendum:  I was the preceptor for this visit and available for immediate consultation.  Norton Blizzard MD CAQSM ? ?

## 2021-04-27 NOTE — Patient Instructions (Signed)
Thank you for coming to see me today. It was a pleasure. Today we talked about:  ? ?I have placed an order for x-rays of your neck and your back.  Please go to [facility] to have this completed.  You do not need an appointment.  We will contact you with your results afterwards if we need to change something, otherwise we will discuss at your next visit.  ? ?We gave you a toradol and a depomedrol shot today to help with your pain.  If this doesn't get better over the next 1-2 days, you can start the prednisone that I sent to the pharmacy for you.  Heat can also be helpful. ? ?I am thinking your side pain may possibly be coming from your neck.  We will investigate this further when we get the x-rays and see what we need to do to help you with this. ? ?Please follow-up with [place] in 2-4 weeks. ? ?If you have any questions or concerns, please do not hesitate to call the office at [phone]. ? ?Best,  ? ?Luis Abed, DO ?[facility]  ?

## 2021-04-27 NOTE — Assessment & Plan Note (Signed)
For the low back pain that could be consistent with discogenic pathology given findings on examination.  Could also consider degenerative process.  Given the severity of her pain, we discussed intramuscular injections today and she opts to proceed.  IM Toradol 60 mg and IM Depo-Medrol 80 mg were performed today.  Given that the patient does not live close, also provided a prescription for prednisone Dosepak for her to take over the next 2 days if she is not having improvement in her pain.  We will obtain lumbar spine films to assess level of degeneration.  Follow-up in 2 to 4 weeks. ?

## 2021-04-27 NOTE — Assessment & Plan Note (Signed)
Patient's chronic pain has not been explained by investigating the region of the ribs where she is most tender.  There is a possibility that this could be a radicular pattern coming from the neck, especially given the findings on her examination.  We will obtain a cervical spine x-ray to evaluate for possible degenerative changes and proceed with an MRI as needed pending these changes.  We will have her follow-up in 2 to 4 weeks to discuss this further. ?

## 2021-04-29 ENCOUNTER — Ambulatory Visit: Payer: BC Managed Care – PPO | Admitting: Family Medicine

## 2021-05-04 ENCOUNTER — Other Ambulatory Visit: Payer: BC Managed Care – PPO

## 2021-05-04 ENCOUNTER — Other Ambulatory Visit: Payer: Self-pay

## 2021-05-04 ENCOUNTER — Ambulatory Visit
Admission: RE | Admit: 2021-05-04 | Discharge: 2021-05-04 | Disposition: A | Discharge status: Home / Self Care | Payer: BC Managed Care – PPO | Source: Ambulatory Visit | Attending: Family Medicine | Admitting: Family Medicine

## 2021-05-04 DIAGNOSIS — M5010 Cervical disc disorder with radiculopathy, unspecified cervical region: Secondary | ICD-10-CM

## 2021-05-04 DIAGNOSIS — M5416 Radiculopathy, lumbar region: Secondary | ICD-10-CM

## 2021-05-04 DIAGNOSIS — M545 Low back pain, unspecified: Secondary | ICD-10-CM | POA: Diagnosis not present

## 2021-05-04 DIAGNOSIS — M542 Cervicalgia: Secondary | ICD-10-CM | POA: Diagnosis not present

## 2021-05-04 DIAGNOSIS — M5412 Radiculopathy, cervical region: Secondary | ICD-10-CM | POA: Diagnosis not present

## 2021-05-06 ENCOUNTER — Ambulatory Visit (INDEPENDENT_AMBULATORY_CARE_PROVIDER_SITE_OTHER): Payer: BC Managed Care – PPO | Admitting: Family Medicine

## 2021-05-06 VITALS — BP 140/91 | HR 67 | Temp 98.7°F | Wt 143.6 lb

## 2021-05-06 DIAGNOSIS — K449 Diaphragmatic hernia without obstruction or gangrene: Secondary | ICD-10-CM | POA: Diagnosis not present

## 2021-05-06 DIAGNOSIS — R109 Unspecified abdominal pain: Secondary | ICD-10-CM | POA: Diagnosis not present

## 2021-05-06 DIAGNOSIS — Z13220 Encounter for screening for lipoid disorders: Secondary | ICD-10-CM | POA: Diagnosis not present

## 2021-05-06 DIAGNOSIS — M4186 Other forms of scoliosis, lumbar region: Secondary | ICD-10-CM

## 2021-05-06 DIAGNOSIS — Z1329 Encounter for screening for other suspected endocrine disorder: Secondary | ICD-10-CM | POA: Diagnosis not present

## 2021-05-06 DIAGNOSIS — M539 Dorsopathy, unspecified: Secondary | ICD-10-CM | POA: Diagnosis not present

## 2021-05-06 DIAGNOSIS — I77810 Thoracic aortic ectasia: Secondary | ICD-10-CM | POA: Diagnosis not present

## 2021-05-06 LAB — CBC WITH DIFFERENTIAL/PLATELET
Basophils Absolute: 0.1 10*3/uL (ref 0.0–0.1)
Basophils Relative: 0.8 % (ref 0.0–3.0)
Eosinophils Absolute: 0.1 10*3/uL (ref 0.0–0.7)
Eosinophils Relative: 1.9 % (ref 0.0–5.0)
HCT: 38.8 % (ref 36.0–46.0)
Hemoglobin: 13.2 g/dL (ref 12.0–15.0)
Lymphocytes Relative: 31.1 % (ref 12.0–46.0)
Lymphs Abs: 2.3 10*3/uL (ref 0.7–4.0)
MCHC: 33.9 g/dL (ref 30.0–36.0)
MCV: 91.2 fl (ref 78.0–100.0)
Monocytes Absolute: 0.5 10*3/uL (ref 0.1–1.0)
Monocytes Relative: 7 % (ref 3.0–12.0)
Neutro Abs: 4.4 10*3/uL (ref 1.4–7.7)
Neutrophils Relative %: 59.2 % (ref 43.0–77.0)
Platelets: 241 10*3/uL (ref 150.0–400.0)
RBC: 4.25 Mil/uL (ref 3.87–5.11)
RDW: 13.4 % (ref 11.5–15.5)
WBC: 7.5 10*3/uL (ref 4.0–10.5)

## 2021-05-06 LAB — COMPREHENSIVE METABOLIC PANEL
ALT: 12 U/L (ref 0–35)
AST: 20 U/L (ref 0–37)
Albumin: 4.3 g/dL (ref 3.5–5.2)
Alkaline Phosphatase: 37 U/L — ABNORMAL LOW (ref 39–117)
BUN: 19 mg/dL (ref 6–23)
CO2: 28 mEq/L (ref 19–32)
Calcium: 9.1 mg/dL (ref 8.4–10.5)
Chloride: 102 mEq/L (ref 96–112)
Creatinine, Ser: 0.84 mg/dL (ref 0.40–1.20)
GFR: 74.14 mL/min (ref >60.00)
Glucose, Bld: 90 mg/dL (ref 70–99)
Potassium: 4 mEq/L (ref 3.5–5.1)
Sodium: 137 mEq/L (ref 135–145)
Total Bilirubin: 0.5 mg/dL (ref 0.2–1.2)
Total Protein: 7.4 g/dL (ref 6.0–8.3)

## 2021-05-06 LAB — LIPID PANEL
Cholesterol: 219 mg/dL — ABNORMAL HIGH (ref 0–200)
HDL: 74.1 mg/dL (ref >39.00)
LDL Cholesterol: 128 mg/dL — ABNORMAL HIGH (ref 0–99)
NonHDL: 144.93
Total CHOL/HDL Ratio: 3
Triglycerides: 85 mg/dL (ref 0.0–149.0)
VLDL: 17 mg/dL (ref 0.0–40.0)

## 2021-05-06 LAB — T4, FREE: Free T4: 1.05 ng/dL (ref 0.60–1.60)

## 2021-05-06 LAB — TSH: TSH: 2.04 u[IU]/mL (ref 0.35–5.50)

## 2021-05-06 LAB — HEMOGLOBIN A1C: Hgb A1c MFr Bld: 5.2 % (ref 4.6–6.5)

## 2021-05-06 NOTE — Progress Notes (Signed)
Subjective:  ? ? Patient ID: Patricia Lloyd, female    DOB: [birth year], 61 y.o.   MRN: [id number] ? ?Chief Complaint  ?Patient presents with  ? Abdominal Pain  ?  Labwork for kidney function and being on meloxicam.  ?Pain on side going on for a while.  ?Did not take the prednisone wen it was prescribed.   ? ? ?HPI ?Patient was seen today for f/u and labs.  Pt still having L flank pain.  Discomfort described as a pounding/throbbing pain that hurts with standing for long periods of time or laying down.  Pain typically starts by 3 PM when patient is at work.  Discomfort goes from left armpit down to left hip.  Also with paresthesias in left lower extremity.  Tried Voltaren gel, Biofreeze, cryo gel.  Symptoms initially thought 2/2 left 9th rib fracture first noted over a year ago after hearing a pop while doing Pilates.  Pt followed by Ortho who felt discomfort was 2/2 pt left hip.  CT chest from 03/12/2020 without cause of patient's pain.  Other incidental findings were noted including hiatal hernia, healed fracture ninth rib, aortic arch mild dilation 3.1 cm with annual follow-up recommended.  Cervical and lumbar spine imaging on 05/04/2021 with L1-L2 severe DDD, grade 1 retrolithiasis, mild levoscoliosis, L4-5 moderate DDD ? ?Past Medical History:  ?Diagnosis Date  ? Fibromyalgia   ? H/O fatigue   ? Insomnia   ? PONV (postoperative nausea and vomiting)   ? severe  ? ? ?No Known Allergies ? ?ROS ?General: Denies fever, chills, night sweats, changes in weight, changes in appetite ?HEENT: Denies headaches, ear pain, changes in vision, rhinorrhea, sore throat ?CV: Denies CP, palpitations, SOB, orthopnea ?Pulm: Denies SOB, cough, wheezing ?GI: Denies abdominal pain, nausea, vomiting, diarrhea, constipation ?GU: Denies dysuria, hematuria, frequency, vaginal discharge ?Msk: Denies muscle cramps, joint pains + left flank pain ?Neuro: Denies weakness, numbness, tingling ?Skin: Denies rashes, bruising ?Psych: Denies depression,  anxiety, hallucinations ?   ?Objective:  ?  ?Blood pressure (!) 140/91, pulse 67, temperature 98.7 ?F (37.1 ?C), temperature source Oral, weight 143 lb 9.6 oz (65.1 kg), last menstrual period 12/12/2012, SpO2 98 %. ? ?Gen. Pleasant, well-nourished, in no distress, normal affect   ?HEENT: [place]/AT, face symmetric, conjunctiva clear, no scleral icterus, PERRLA, EOMI, nares patent without drainage ?Lungs: no accessory muscle use, CTAB, no wheezes or rales ?Cardiovascular: RRR, no m/r/g, no peripheral edema ?Abdomen: BS present, soft, NT/ND, no hepatosplenomegaly. ?Musculoskeletal: No deformities, no cyanosis or clubbing, normal tone ?Neuro:  A&Ox3, CN II-XII intact, normal gait ?Skin:  Warm, no lesions/ rash ? ? ?Wt Readings from Last 3 Encounters:  ?05/06/21 143 lb 9.6 oz (65.1 kg)  ?04/27/21 140 lb (63.5 kg)  ?03/17/21 143 lb 9.6 oz (65.1 kg)  ? ? ?Lab Results  ?Component Value Date  ? WBC 6.7 09/17/2020  ? HGB 12.2 09/17/2020  ? HCT 37.2 09/17/2020  ? PLT 224.0 09/17/2020  ? GLUCOSE 80 09/17/2020  ? CHOL 176 09/17/2020  ? TRIG 85.0 09/17/2020  ? HDL 53.60 09/17/2020  ? LDLCALC 105 (H) 09/17/2020  ? ALT 15 09/12/2019  ? AST 21 09/12/2019  ? NA 140 09/17/2020  ? K 4.1 09/17/2020  ? CL 103 09/17/2020  ? CREATININE 0.88 09/17/2020  ? BUN 20 09/17/2020  ? CO2 30 09/17/2020  ? TSH 2.03 09/17/2020  ? HGBA1C 5.5 09/17/2020  ? ? ?Assessment/Plan: ? ?Left flank pain  ?-Symptoms initially thought 2/2 left 9th rib fracture over  1 year ago, but have continued. ?-Unclear cause of continued left flank pain, though given description of worsening symptoms with prolonged standing consider referred pain from left hip labral tear. ?-CT Chest 03/12/20 with incidental findings though none thought to be associated with patient's current symptoms. ?-MRI left hip 11/10/2019 with mild bilateral hip joint degenerative changes age.  No stress fracture or AVN.  Findings highly suspicious for superior anterior labral tear involving left hip.  Mild  bilateral tendinosis but no trochanteric bursitis. ?-CT abdomen pelvis to further evaluate ?-Given precautions ?- Plan: CBC with Differential/Platelet, CMP, Hemoglobin A1c ? ?Dilation of thoracic aorta (HCC) ?-Noted on CT chest 03/12/2020.  Mild aortic arch dilation 3.1 cm. ?-Annual follow-up recommended. ?-BP control ? ?Multilevel degenerative disc disease ?-Noted on imaging of lumbar spine from 05/04/2021: L1-L2 severe DDD, grade 1 retrolithiasis.  Mild levoscoliosis L4-L5 moderate DDD. ?-Discussed supportive care including stretching, heat, ice, massage, topical analgesics, NSAIDs or Tylenol. ?-Continue Pilates and/or yoga. ?-Consider water aerobics ?-Continue follow-up with Ortho ? ?Hiatal hernia ?-Stable ?-Noted on CT chest from 03/12/2020 ?-Continue lifestyle modifications ?-PPI if needed ? ?Levoscoliosis of lumbar spine ?-Stable ?-Noted on lumbar x-ray from 05/04/2021 ?-Consider PT ? ?Screening for cholesterol level  ?- Plan: Lipid panel ? ?Screening for thyroid disorder  ?- Plan: TSH, T4, Free ? ?F/u as needed ? ?Grier Mitts, MD ?

## 2021-05-12 ENCOUNTER — Encounter: Payer: Self-pay | Admitting: Family Medicine

## 2021-05-16 DIAGNOSIS — N950 Postmenopausal bleeding: Secondary | ICD-10-CM | POA: Diagnosis not present

## 2021-05-18 ENCOUNTER — Ambulatory Visit: Payer: BC Managed Care – PPO | Admitting: Family Medicine

## 2021-05-18 DIAGNOSIS — L720 Epidermal cyst: Secondary | ICD-10-CM | POA: Diagnosis not present

## 2021-05-18 DIAGNOSIS — L659 Nonscarring hair loss, unspecified: Secondary | ICD-10-CM | POA: Diagnosis not present

## 2021-05-18 DIAGNOSIS — L538 Other specified erythematous conditions: Secondary | ICD-10-CM | POA: Diagnosis not present

## 2021-05-18 DIAGNOSIS — L578 Other skin changes due to chronic exposure to nonionizing radiation: Secondary | ICD-10-CM | POA: Diagnosis not present

## 2021-05-18 DIAGNOSIS — L570 Actinic keratosis: Secondary | ICD-10-CM | POA: Diagnosis not present

## 2021-05-18 DIAGNOSIS — L820 Inflamed seborrheic keratosis: Secondary | ICD-10-CM | POA: Diagnosis not present

## 2021-05-18 DIAGNOSIS — D225 Melanocytic nevi of trunk: Secondary | ICD-10-CM | POA: Diagnosis not present

## 2021-05-18 NOTE — Progress Notes (Deleted)
   Ramia Sidney Frankson is a 61 y.o. female who presents to [facility] today for the following:  Low back pain follow-up Seen for an acute flare on 4/19 At that time received IM Toradol and IM Depo-Medrol, also given a Dosepak X-rays performed as well, degenerative disc disease, worse at L1-L2 and L5-S1, with grade 1 anteriolisthesis of L4 on L5 and retrolisthesis grade 1 of L1 on L2 ***  Left-sided pain follow-up She has also been seen for multiple providers for left side/axilla pain in the region of her ribs She has had multiple x-rays of her ribs that have been within normal limits She reported occasional numbness and tingling and having severe tenderness throughout the axilla into her ribs This pain is exacerbated by her arm and neck She had x-rays performed of her cervical spine on 4/27 which showed loss of lordotic curve and degenerative disc disease, worse at C6 on C7 While taking steroids*** ***   PMH reviewed. *** ROS as above. Medications reviewed.  Exam:  LMP 12/12/2012  Gen: Well NAD MSK:  Neck/Back: - Inspection: no gross deformity or asymmetry, swelling or ecchymosis - Palpation: TTP ***  spinous process, TTP, *** rhomboid and trapezius with pressure point at mid scapula level - ROM: full active ROM of the cervical spine with neck extension, rotation, flexion - pain in all directions - Strength: 5/5 wrist flexion, extension, biceps flexion. 4/5 triceps extension. OK sign, interosseus strength intact  - Neuro: sensation intact in the C5-C8 nerve root distribution b/l, 2+ C5-C7 reflexes - Special testing: positive slump test, positive spurling's   Lumbar spine: - Inspection: no gross deformity or asymmetry, swelling or ecchymosis - Palpation: No TTP over the spinous processes, paraspinal muscles, or SI joints b/l - ROM: full active ROM of the lumbar spine in flexion and extension without pain - Strength: 5/5 strength of lower extremity in L4-S1 nerve root distributions  b/l; normal gait - Neuro: sensation intact in the L4-S1 nerve root distribution b/l, 2+ L4 and S1 reflexes - Special testing: Negative straight leg raise, negative slump, negative Stork test, Negative FABER, Negative Gaenselen's, Negative Waddell's signs   Assessment and Plan: 1) No problem-specific Assessment & Plan notes found for this encounter.   Luis Abed, D.O.  PGY-4 [facility]  05/18/2021 8:21 AM

## 2021-05-18 NOTE — Addendum Note (Signed)
Addended by: Annita Brod on: 05/18/2021 04:45 PM ? ? Modules accepted: Orders ? ?

## 2021-05-27 IMAGING — DX DG CHEST 2V
2 series · 2 of 2 positions shown · non-contrast
Comparison: 01/04/2011

CLINICAL DATA: Left lower rib tenderness

EXAM:
CHEST - 2 VIEW

[chest pa]
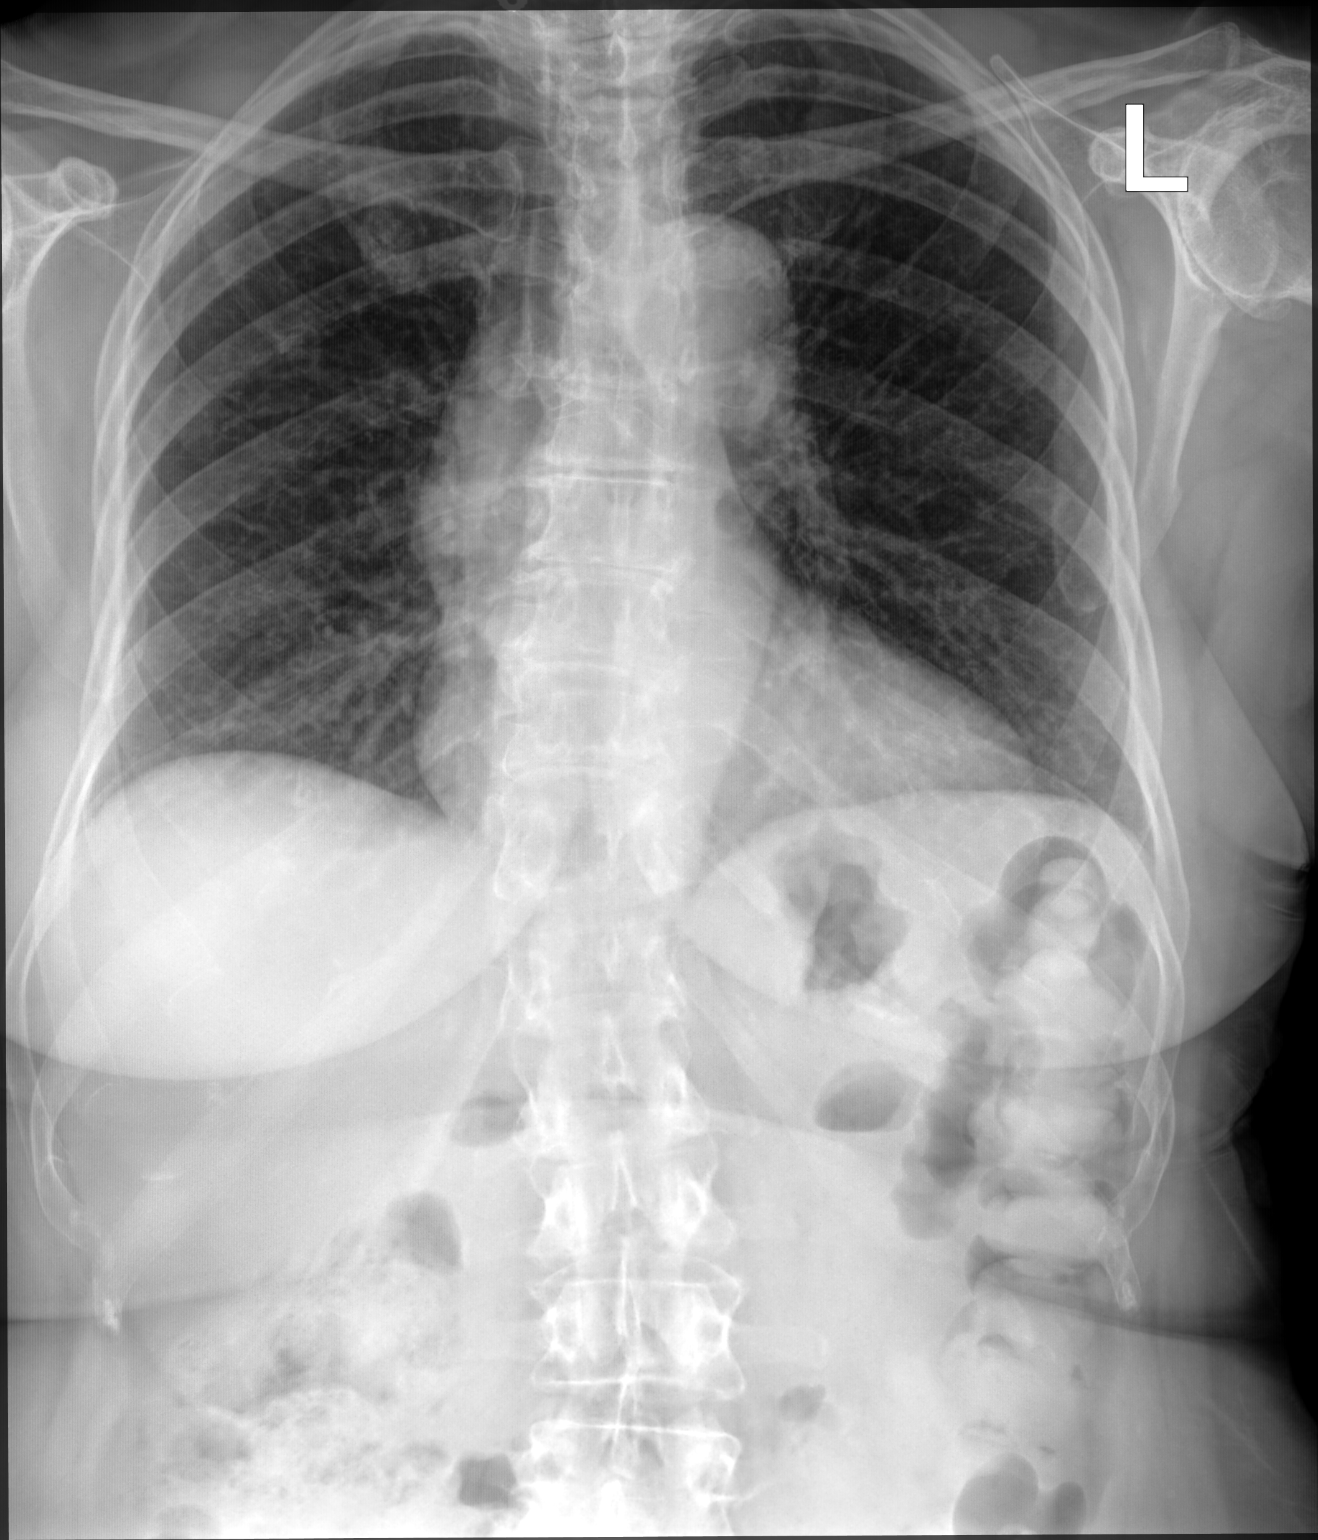

[chest lat]
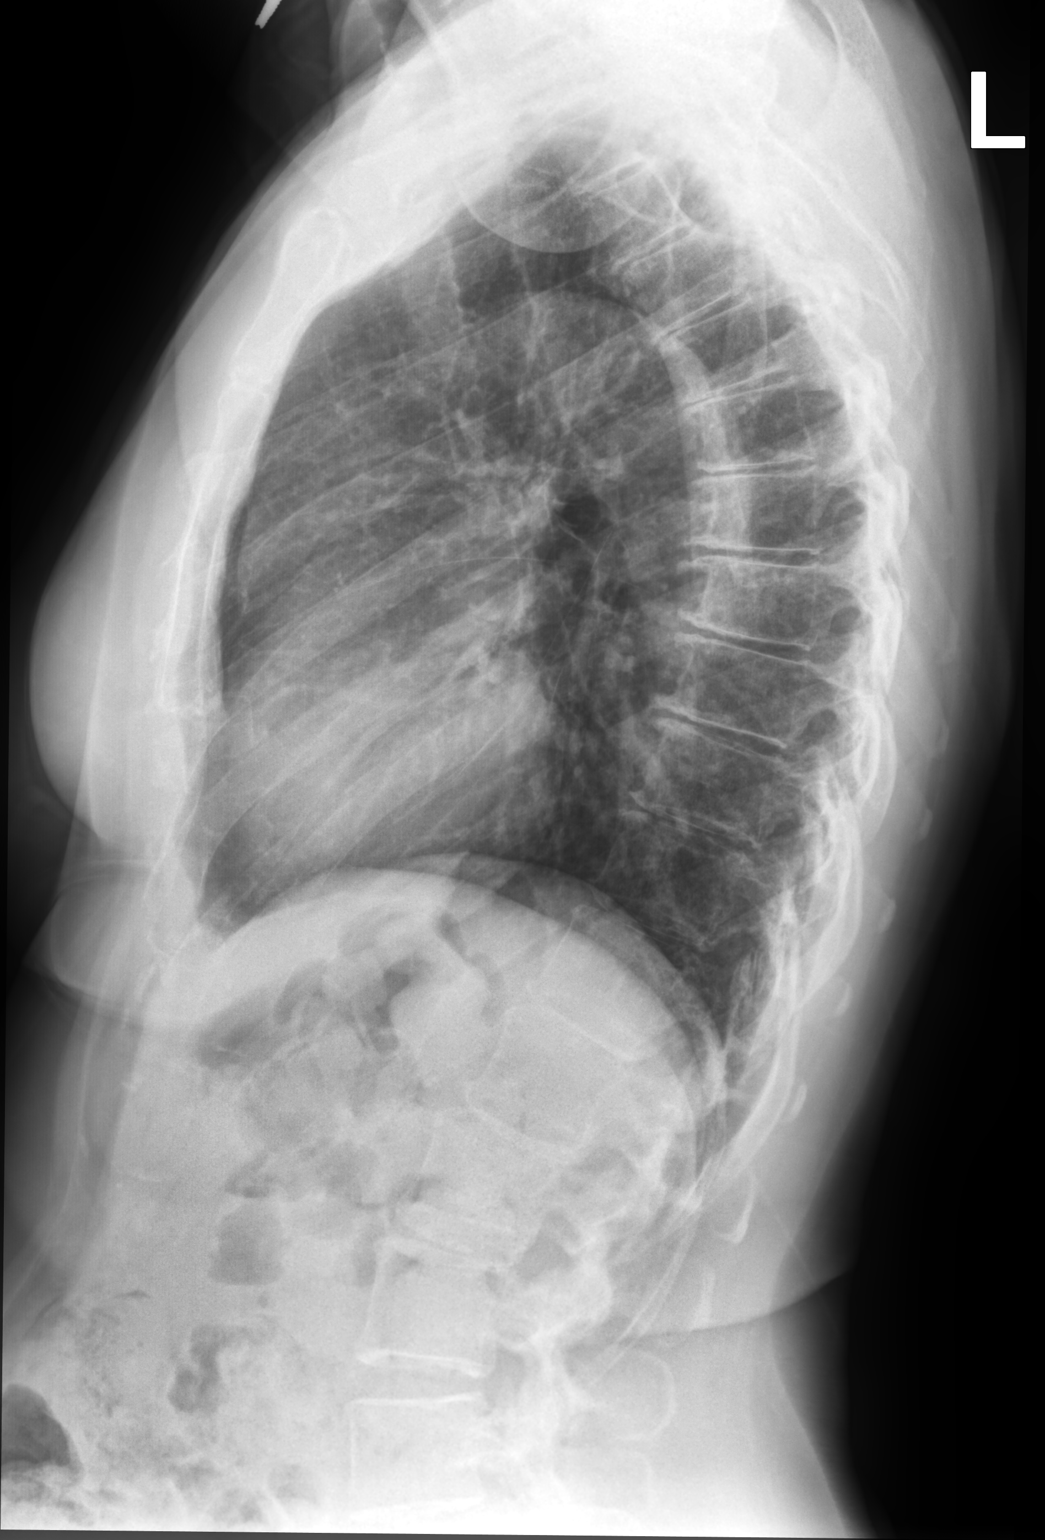

[2 of 2 positions shown; findings below may reference images not displayed]

FINDINGS: No acute consolidation or effusion. Stable cardiomediastinal
silhouette. No pneumothorax. Possible chronic left tenth
anterolateral rib fracture
IMPRESSION: No active cardiopulmonary disease. Possible chronic left tenth
anterolateral rib fracture

## 2021-06-12 ENCOUNTER — Other Ambulatory Visit: Payer: Self-pay | Admitting: Sports Medicine

## 2021-06-15 ENCOUNTER — Other Ambulatory Visit: Payer: Self-pay | Admitting: *Deleted

## 2021-06-15 MED ORDER — MELOXICAM 15 MG PO TABS
ORAL_TABLET | ORAL | 0 refills | Status: DC
Start: 2021-06-15 — End: 2022-01-10

## 2021-08-16 DIAGNOSIS — F4323 Adjustment disorder with mixed anxiety and depressed mood: Secondary | ICD-10-CM | POA: Diagnosis not present

## 2021-08-26 ENCOUNTER — Ambulatory Visit (INDEPENDENT_AMBULATORY_CARE_PROVIDER_SITE_OTHER): Payer: BC Managed Care – PPO

## 2021-08-26 ENCOUNTER — Ambulatory Visit (INDEPENDENT_AMBULATORY_CARE_PROVIDER_SITE_OTHER): Payer: BC Managed Care – PPO | Admitting: Family Medicine

## 2021-08-26 ENCOUNTER — Encounter: Payer: Self-pay | Admitting: Family Medicine

## 2021-08-26 VITALS — BP 102/88 | HR 70 | Temp 99.0°F | Wt 141.4 lb

## 2021-08-26 DIAGNOSIS — E860 Dehydration: Secondary | ICD-10-CM

## 2021-08-26 DIAGNOSIS — R42 Dizziness and giddiness: Secondary | ICD-10-CM

## 2021-08-26 DIAGNOSIS — R072 Precordial pain: Secondary | ICD-10-CM | POA: Diagnosis not present

## 2021-08-26 DIAGNOSIS — W19XXXA Unspecified fall, initial encounter: Secondary | ICD-10-CM

## 2021-08-26 DIAGNOSIS — R682 Dry mouth, unspecified: Secondary | ICD-10-CM

## 2021-08-26 DIAGNOSIS — R109 Unspecified abdominal pain: Secondary | ICD-10-CM | POA: Diagnosis not present

## 2021-08-26 DIAGNOSIS — R3129 Other microscopic hematuria: Secondary | ICD-10-CM

## 2021-08-26 DIAGNOSIS — R519 Headache, unspecified: Secondary | ICD-10-CM | POA: Diagnosis not present

## 2021-08-26 LAB — CBC WITH DIFFERENTIAL/PLATELET
Basophils Absolute: 0.1 10*3/uL (ref 0.0–0.1)
Basophils Relative: 1.2 % (ref 0.0–3.0)
Eosinophils Absolute: 0.1 10*3/uL (ref 0.0–0.7)
Eosinophils Relative: 2.4 % (ref 0.0–5.0)
HCT: 36.6 % (ref 36.0–46.0)
Hemoglobin: 12.3 g/dL (ref 12.0–15.0)
Lymphocytes Relative: 39.3 % (ref 12.0–46.0)
Lymphs Abs: 2.3 10*3/uL (ref 0.7–4.0)
MCHC: 33.5 g/dL (ref 30.0–36.0)
MCV: 91.1 fl (ref 78.0–100.0)
Monocytes Absolute: 0.4 10*3/uL (ref 0.1–1.0)
Monocytes Relative: 7 % (ref 3.0–12.0)
Neutro Abs: 3 10*3/uL (ref 1.4–7.7)
Neutrophils Relative %: 50.1 % (ref 43.0–77.0)
Platelets: 205 10*3/uL (ref 150.0–400.0)
RBC: 4.02 Mil/uL (ref 3.87–5.11)
RDW: 13 % (ref 11.5–15.5)
WBC: 5.9 10*3/uL (ref 4.0–10.5)

## 2021-08-26 LAB — POCT URINALYSIS DIPSTICK
Bilirubin, UA: NEGATIVE
Glucose, UA: NEGATIVE
Ketones, UA: POSITIVE
Leukocytes, UA: NEGATIVE
Nitrite, UA: NEGATIVE
Protein, UA: POSITIVE — AB
Spec Grav, UA: 1.025 (ref 1.010–1.025)
Urobilinogen, UA: NEGATIVE E.U./dL — AB
pH, UA: 6 (ref 5.0–8.0)

## 2021-08-26 LAB — URINALYSIS, MICROSCOPIC ONLY

## 2021-08-26 LAB — BASIC METABOLIC PANEL
BUN: 17 mg/dL (ref 6–23)
CO2: 30 mEq/L (ref 19–32)
Calcium: 9.1 mg/dL (ref 8.4–10.5)
Chloride: 102 mEq/L (ref 96–112)
Creatinine, Ser: 0.83 mg/dL (ref 0.40–1.20)
GFR: 75.05 mL/min (ref >60.00)
Glucose, Bld: 85 mg/dL (ref 70–99)
Potassium: 3.8 mEq/L (ref 3.5–5.1)
Sodium: 140 mEq/L (ref 135–145)

## 2021-08-26 LAB — HEMOGLOBIN A1C: Hgb A1c MFr Bld: 5.5 % (ref 4.6–6.5)

## 2021-08-26 LAB — T4, FREE: Free T4: 0.82 ng/dL (ref 0.60–1.60)

## 2021-08-26 LAB — TSH: TSH: 2.14 u[IU]/mL (ref 0.35–5.50)

## 2021-08-26 NOTE — Progress Notes (Signed)
Subjective:    Patient ID: Patricia Lloyd, female    DOB: 1960-03-03, 61 y.o.   MRN: [id number]  Chief Complaint  Patient presents with   Genia Hotter on her side, bruised. Happened on Sunday, had to go to [place] on Monday. Feels pressure from back on around on right side.   Dizziness    Has been told she has low end HTN, wil get dizzy al of a sudden and then she is okay    Dehydration    Wake up with dry mouth and lips are dry. As been drinking more water.    HPI Patient was seen today for acute concerns.  Pt fell 5 days ago hitting her coffee table.  Pt had a bruise on R flank.  Pt went to [place] on Monday for work.  Endorses a continued tightness of side.  Was worried she may have broken a rib given h/o L lower rib fx a yr ago.  Pt having 3 HAs per wk for the last 3-6 months at times waking her up at night.  Endorses intermittent dizziness x 6 months.  Has also noticed mouth and lips being dry. Wakes up with mouth being dry.  Pt notes stress a/w having a business.  Past Medical History:  Diagnosis Date   Fibromyalgia    H/O fatigue    Insomnia    PONV (postoperative nausea and vomiting)    severe    No Known Allergies  ROS General: Denies fever, chills, night sweats, changes in weight, changes in appetite  +dizziness, fall HEENT: Denies headaches, ear pain, changes in vision, rhinorrhea, sore throat  +dry mouth CV: Denies CP, palpitations, SOB, orthopnea Pulm: Denies SOB, cough, wheezing GI: Denies abdominal pain, nausea, vomiting, diarrhea, constipation GU: Denies dysuria, hematuria, frequency, vaginal discharge Msk: Denies muscle cramps, joint pains  +R flank soreness/tightness Neuro: Denies weakness, numbness, tingling Skin: Denies rashes, bruising Psych: Denies depression, anxiety, hallucinations  +stress     Objective:    Blood pressure 102/88, pulse 70, temperature 99 F (37.2 C), temperature source Oral, weight 141 lb 6.4 oz (64.1 kg), last menstrual period 12/12/2012,  SpO2 97 %.  Gen. Pleasant, well-nourished, in no distress, normal affect   HEENT: [place]/AT, face symmetric, conjunctiva clear, no scleral icterus, PERRLA, EOMI, no nystagmus, nares patent without drainage. Neck: no carotid bruits Lungs: no accessory muscle use, CTAB, no wheezes or rales Cardiovascular: RRR, no m/r/g, no peripheral edema Abdomen: BS present, soft, NT/ND, no hepatosplenomegaly. Musculoskeletal: TTP of R flank below rib cage, no edema.  No increased pain with deep breathing or from vibration of tuning fork placed on rib cage.  No deformities, no cyanosis or clubbing, normal tone. Neuro:  A&Ox3, CN II-XII intact, normal gait Skin:  Warm, no lesions/ rash.  No erythema, rash, of R flank or back.   Wt Readings from Last 3 Encounters:  08/26/21 141 lb 6.4 oz (64.1 kg)  05/06/21 143 lb 9.6 oz (65.1 kg)  04/27/21 140 lb (63.5 kg)   BP Readings from Last 3 Encounters:  08/26/21 102/88  05/06/21 (!) 140/91  04/27/21 122/85    Lab Results  Component Value Date   WBC 7.5 05/06/2021   HGB 13.2 05/06/2021   HCT 38.8 05/06/2021   PLT 241.0 05/06/2021   GLUCOSE 90 05/06/2021   CHOL 219 (H) 05/06/2021   TRIG 85.0 05/06/2021   HDL 74.10 05/06/2021   LDLCALC 128 (H) 05/06/2021   ALT 12 05/06/2021   AST  20 05/06/2021   NA 137 05/06/2021   K 4.0 05/06/2021   CL 102 05/06/2021   CREATININE 0.84 05/06/2021   BUN 19 05/06/2021   CO2 28 05/06/2021   TSH 2.04 05/06/2021   HGBA1C 5.2 05/06/2021    Assessment/Plan:  Acute right flank pain  - Plan: DG Chest 2 View  Dizziness -discussed possible causes including dehydration, hypotension, medications, hypoglycemia, thyroid dysfunction  - Plan: POCT urinalysis dipstick, TSH, T4, Free, CBC with Differential/Platelet, Hemoglobin A1c, Basic metabolic panel  Dehydration  -UA with SG 1.025, ketones - Plan: POCT urinalysis dipstick, Hemoglobin A1c, Basic metabolic panel  Fall, initial encounter -fall prevention  - Plan: DG Chest  2 View, TSH, T4, Free, CBC with Differential/Platelet, Hemoglobin A1c, Basic metabolic panel  Frequent headaches -discussed eating regular meals, staying hydrated, limiting caffeine intake and use of NSAIDs to prevent HAs. -continue exercising and self care  - Plan: TSH, T4, Free, CBC with Differential/Platelet, Basic metabolic panel  Precordial chest pain  - Plan: CBC with Differential/Platelet  Other microscopic hematuria -UA with 1+RBCs -consider UTI, renal calculi -f/u with Urology based on results  - Plan: Urine Microscopic Only, Culture, Urine  Dry mouth  F/u prn  Abbe Amsterdam, MD

## 2021-08-28 LAB — URINE CULTURE
MICRO NUMBER:: 13800389
Result:: NO GROWTH
SPECIMEN QUALITY:: ADEQUATE

## 2021-08-29 DIAGNOSIS — F4323 Adjustment disorder with mixed anxiety and depressed mood: Secondary | ICD-10-CM | POA: Diagnosis not present

## 2021-09-06 DIAGNOSIS — F4323 Adjustment disorder with mixed anxiety and depressed mood: Secondary | ICD-10-CM | POA: Diagnosis not present

## 2021-09-12 DIAGNOSIS — F4323 Adjustment disorder with mixed anxiety and depressed mood: Secondary | ICD-10-CM | POA: Diagnosis not present

## 2021-09-13 ENCOUNTER — Encounter: Payer: Self-pay | Admitting: Family Medicine

## 2021-11-08 ENCOUNTER — Other Ambulatory Visit: Payer: Self-pay | Admitting: Sports Medicine

## 2021-11-21 ENCOUNTER — Ambulatory Visit (INDEPENDENT_AMBULATORY_CARE_PROVIDER_SITE_OTHER): Payer: BC Managed Care – PPO

## 2021-11-21 DIAGNOSIS — Z23 Encounter for immunization: Secondary | ICD-10-CM

## 2021-11-23 ENCOUNTER — Ambulatory Visit: Payer: Self-pay

## 2021-11-23 ENCOUNTER — Ambulatory Visit: Payer: BC Managed Care – PPO | Admitting: Sports Medicine

## 2021-11-23 ENCOUNTER — Ambulatory Visit
Admission: RE | Admit: 2021-11-23 | Discharge: 2021-11-23 | Disposition: A | Discharge status: Home / Self Care | Payer: BC Managed Care – PPO | Source: Ambulatory Visit | Attending: Family Medicine | Admitting: Family Medicine

## 2021-11-23 VITALS — BP 118/86 | Ht 64.0 in | Wt 142.0 lb

## 2021-11-23 DIAGNOSIS — M25561 Pain in right knee: Secondary | ICD-10-CM

## 2021-11-23 DIAGNOSIS — M25562 Pain in left knee: Secondary | ICD-10-CM

## 2021-11-23 NOTE — Progress Notes (Unsigned)
Patricia Lloyd - 61 y.o. female MRN [id number]  Date of birth: 1960-03-30    CHIEF COMPLAINT:   Bilateral knee pain    SUBJECTIVE:   HPI:  Patient is a pleasant 61 year old female here for evaluation of bilateral knee pain.  She fell onto her bilateral knees when chasing after her dog about 2 weeks ago.  She said the left initially hurt more than the right.  She had a constant dull ache on the lateral part of left patella.  She has tried icing it and taking meloxicam (which she takes already daily at baseline for arthritis in her neck).  The left one has gotten a little bit better but is still generally sore.  About a week after a fall the right one started hurting a little bit more.  She says the right leg feels weaker now.  She feels the right knee mechanically catching and locking.  The right knee hurts worse with going up and down stairs.  It also aches when she is lying in bed at night.  She also thinks there might be a small bump over the right patella.  The pain does not radiate down either the legs.  Of note, she had no prior issues with the knees before.  She does have arthritis in her back but she reports no known history of arthritis in her knees.  No meniscal injuries in the past.  ROS:     See HPI  PERTINENT  PMH / PSH FH / / SH:  Past Medical, Surgical, Social, and Family History Reviewed & Updated in the EMR.  Pertinent findings include:  Osteoarthritis  OBJECTIVE: BP 118/86   Ht 5\' 4"  (1.626 m)   Wt 142 lb (64.4 kg)   LMP 12/12/2012   BMI 24.37 kg/m   Physical Exam:  Vital signs are reviewed.  GEN: Alert and oriented, NAD Pulm: Breathing unlabored PSY: normal mood, congruent affect  MSK: Right knee -no effusion.  No obvious deformity.  Tender to palpation at the medial joint line and over the distal patella.  Nontender to palpation over the lateral joint line or the tibial tubercle.  She has full range of motion in flexion extension.  5/5 strength in flexion  extension.  She has no ligamentous instability with valgus or varus stressing.  Negative Lachman.  Negative Thessaly test.  Left knee -no effusion.  No obvious deformity.  Tender to palpation at the lateral joint line and over the distal patella.  Nontender to palpation over the medial joint line or the tibial tubercle.  She has full range of motion in flexion extension.  5/5 strength in flexion extension.  She has no ligamentous instability with valgus or varus stressing.  Negative Lachman.  Negative Thessaly test.  ULTRASOUND:  MSK ultrasound R knee: Images were obtained both in the transverse and longitudinal plane. Patellar and quadriceps tendons were well visualized with no abnormalities. No effusion. Medial and lateral menisci were well visualized with no abnormalities.  MSK ultrasound L knee: Images were obtained both in the transverse and longitudinal plane. Patellar and quadriceps tendons were well visualized with no abnormalities. No effusion. Medial and lateral menisci were well visualized with no abnormalities.   Impression: unremarkable bilateral knee ultrasounds  ASSESSMENT & PLAN:  1. L Knee Contusion -This is an acute uncomplicated problem.  It is already starting to get better.  I do not see any red flags on exam or ultrasound today to raise suspicion for intra-articular pathology.  Recommended  she continue RICE and meloxicam for the next week.  She can wear compression sleeve for comfort.  2. R Knee Pain  -This is an acute uncomplicated problem.  Although she did not initially have pain in the right knee her history of osteoarthritis in other locations, mechanical catching and locking and weakness in the knee makes me more suspicious for degenerative meniscus pathology in this knee.  I will start by obtaining an x-ray to evaluate her joint space and to make sure to not miss an occult fracture in the right knee.  I will have her start formal physical therapy to work on quad  strengthening.  She will continue Mobic.  We will have her follow-up in 6 to 8 weeks to check her progress.  If no improvement, would offer her a corticosteroid injection and discuss MRI, depending on the integrity of her joint space from the x-rays.  All questions were answered she agrees to plan.  Arvella Nigh, MD PGY-4, Sports Medicine Fellow [facility]  Addendum:  Patient seen in the office by fellow.  His history, exam, plan of care were precepted with me.  Norton Blizzard MD Marrianne Mood

## 2021-11-24 ENCOUNTER — Encounter: Payer: Self-pay | Admitting: Sports Medicine

## 2021-12-07 DIAGNOSIS — L0102 Bockhart's impetigo: Secondary | ICD-10-CM | POA: Diagnosis not present

## 2021-12-07 DIAGNOSIS — D485 Neoplasm of uncertain behavior of skin: Secondary | ICD-10-CM | POA: Diagnosis not present

## 2021-12-07 DIAGNOSIS — L98499 Non-pressure chronic ulcer of skin of other sites with unspecified severity: Secondary | ICD-10-CM | POA: Diagnosis not present

## 2021-12-07 DIAGNOSIS — L905 Scar conditions and fibrosis of skin: Secondary | ICD-10-CM | POA: Diagnosis not present

## 2021-12-21 ENCOUNTER — Ambulatory Visit (INDEPENDENT_AMBULATORY_CARE_PROVIDER_SITE_OTHER): Payer: BC Managed Care – PPO | Admitting: Family Medicine

## 2021-12-21 VITALS — BP 124/86 | HR 72 | Temp 98.1°F | Wt 147.8 lb

## 2021-12-21 DIAGNOSIS — M549 Dorsalgia, unspecified: Secondary | ICD-10-CM

## 2021-12-21 DIAGNOSIS — Z789 Other specified health status: Secondary | ICD-10-CM | POA: Diagnosis not present

## 2021-12-21 LAB — POCT URINALYSIS DIPSTICK
Bilirubin, UA: NEGATIVE
Blood, UA: NEGATIVE
Glucose, UA: NEGATIVE
Ketones, UA: NEGATIVE
Leukocytes, UA: NEGATIVE
Nitrite, UA: NEGATIVE
Protein, UA: POSITIVE — AB
Spec Grav, UA: 1.025 (ref 1.010–1.025)
Urobilinogen, UA: 0.2 E.U./dL
pH, UA: 6 (ref 5.0–8.0)

## 2021-12-21 MED ORDER — CYCLOBENZAPRINE HCL 5 MG PO TABS: 5.0000 mg | ORAL_TABLET | Freq: Every evening | ORAL | 1 refills | Status: DC | PRN

## 2021-12-21 NOTE — Progress Notes (Signed)
Subjective:    Patient ID: Patricia Lloyd, female    DOB: 11-25-1960, 61 y.o.   MRN: [id number]  Chief Complaint  Patient presents with   Back Pain    Pt reports back pain. Burning pain on back and both on flank sides. Noticed it this past Thursday.     HPI Patient is a 61 year old female who was seen today for acute concern.  Pt with acute on chronic upper lumbar back pain with radiation to bilateral sides.  Woke up Thrusday with burning sensation across both hips.  Denies injury.  Stretching regularly.  Does Pilates.  Was doing physical therapy.  In the past dry needling helped.  Was getting regular massages and seeing sports med but unable to get in for several weeks.  Patient alternating Tylenol and Mobic as well as using heat for symptoms.  Patient noticed left hip higher than right hip.  Has history of left hip labral tear.  Patient interested in weight loss medication.  States her cardiologist advised her to lose weight given central distribution of adipose tissue.  Patient may lose for 5 pounds here and there.  May have a waffle for breakfast, protein drink at lunch, and a meal at dinner.  May miss lunch if busy at work.  Past Medical History:  Diagnosis Date   Fibromyalgia    H/O fatigue    Insomnia    PONV (postoperative nausea and vomiting)    severe    No Known Allergies  ROS Finkelstein General: Denies fever, chills, night sweats, changes in appetite + weight gain HEENT: Denies headaches, ear pain, changes in vision, rhinorrhea, sore throat CV: Denies CP, palpitations, SOB, orthopnea Pulm: Denies SOB, cough, wheezing GI: Denies abdominal pain, nausea, vomiting, diarrhea, constipation GU: Denies dysuria, hematuria, frequency, vaginal discharge Msk: Denies muscle cramps, joint pains  +back pain Neuro: Denies weakness, numbness, tingling Skin: Denies rashes, bruising Psych: Denies depression, anxiety, hallucinations     Objective:    Blood pressure 124/86, pulse  72, temperature 98.1 F (36.7 C), temperature source Oral, weight 147 lb 12.8 oz (67 kg), last menstrual period 12/12/2012, SpO2 99 %. Body mass index is 25.37 kg/m.  Gen. Pleasant, well-nourished, in no distress, normal affect  HEENT: [place]/AT, face symmetric, conjunctiva clear, no scleral icterus, PERRLA, EOMI, nares patent without drainage Lungs: no accessory muscle use, CTAB, no wheezes or rales Cardiovascular: RRR, no m/r/g, no peripheral edema Abdomen: BS present, soft, NT/ND. Musculoskeletal: On visual inspection hips uneven.  TTP upper lumbar spine midline and bilaterally.  No TTP cervical, thoracic spine.  No weakness in bilateral LEs.  No deformities, no cyanosis or clubbing, normal tone Neuro:  A&Ox3, CN II-XII intact, normal gait Skin:  Warm, no lesions/ rash   Wt Readings from Last 3 Encounters:  11/23/21 142 lb (64.4 kg)  08/26/21 141 lb 6.4 oz (64.1 kg)  05/06/21 143 lb 9.6 oz (65.1 kg)    Lab Results  Component Value Date   WBC 5.9 08/26/2021   HGB 12.3 08/26/2021   HCT 36.6 08/26/2021   PLT 205.0 08/26/2021   GLUCOSE 85 08/26/2021   CHOL 219 (H) 05/06/2021   TRIG 85.0 05/06/2021   HDL 74.10 05/06/2021   LDLCALC 128 (H) 05/06/2021   ALT 12 05/06/2021   AST 20 05/06/2021   NA 140 08/26/2021   K 3.8 08/26/2021   CL 102 08/26/2021   CREATININE 0.83 08/26/2021   BUN 17 08/26/2021   CO2 30 08/26/2021   TSH 2.14 08/26/2021  HGBA1C 5.5 08/26/2021    Assessment/Plan:  Bilateral back pain, unspecified back location, unspecified chronicity -Discussed symptoms likely 2/2 nerve impingement due to muscle strain or worsening arthritis.  Also consider renal calculi, UTI -X-ray lumbar spine 05/04/2021 with multilevel degenerative disc disease.  Mild levoscoliosis of lumbar spine, minimal grade 1 retrolisthesis L1-L2 with severe degenerative disc disease, mild grade 1 anterior listhesis of L4-5, moderate degenerative disc disease L5-S1. -Continue supportive care including  stretching, topical analgesics, heat, OTC meds -Will place referral to PT for dry needling -Flexeril as needed -For continued symptoms follow-up with sports med/Ortho. - Plan: POC Urinalysis Dipstick, Ambulatory referral to Physical Therapy, cyclobenzaprine (FLEXERIL) 5 MG tablet  Educated about management of weight -Body mass index is 25.37 kg/m. -Advised weight appropriate at this time. -Offered referral to weight management.  Patient declines at this time.  F/u as needed for continued or worsening symptoms  Abbe Amsterdam, MD

## 2021-12-23 ENCOUNTER — Telehealth: Payer: Self-pay | Admitting: Family Medicine

## 2021-12-23 NOTE — Telephone Encounter (Signed)
Pt says she cannot go to [facility] for PT because it is way too expensive.  Pt would like to go to the one she previously went to instead, but cannot remember the name.   Pt states it is also on [employer].  Please advise.

## 2021-12-27 DIAGNOSIS — Z1231 Encounter for screening mammogram for malignant neoplasm of breast: Secondary | ICD-10-CM | POA: Diagnosis not present

## 2021-12-27 LAB — HM MAMMOGRAPHY

## 2022-01-01 ENCOUNTER — Other Ambulatory Visit: Payer: Self-pay | Admitting: Sports Medicine

## 2022-01-03 ENCOUNTER — Encounter: Payer: Self-pay | Admitting: Family Medicine

## 2022-01-07 ENCOUNTER — Other Ambulatory Visit: Payer: Self-pay | Admitting: Sports Medicine

## 2022-01-12 ENCOUNTER — Ambulatory Visit (INDEPENDENT_AMBULATORY_CARE_PROVIDER_SITE_OTHER): Payer: BC Managed Care – PPO | Admitting: Family

## 2022-01-12 ENCOUNTER — Encounter: Payer: Self-pay | Admitting: Family

## 2022-01-12 VITALS — BP 148/90 | HR 78 | Temp 99.0°F | Ht 64.0 in | Wt 144.5 lb

## 2022-01-12 DIAGNOSIS — R059 Cough, unspecified: Secondary | ICD-10-CM

## 2022-01-12 DIAGNOSIS — R6889 Other general symptoms and signs: Secondary | ICD-10-CM

## 2022-01-12 LAB — POC COVID19 BINAXNOW: SARS Coronavirus 2 Ag: NEGATIVE

## 2022-01-12 LAB — POCT INFLUENZA A/B
Influenza A, POC: NEGATIVE
Influenza B, POC: NEGATIVE

## 2022-01-12 MED ORDER — BENZONATATE 200 MG PO CAPS: 200.0000 mg | ORAL_CAPSULE | Freq: Three times a day (TID) | ORAL | 0 refills | Status: AC | PRN

## 2022-01-12 MED ORDER — OSELTAMIVIR PHOSPHATE 75 MG PO CAPS: 75.0000 mg | ORAL_CAPSULE | Freq: Two times a day (BID) | ORAL | 0 refills | Status: DC

## 2022-01-12 NOTE — Progress Notes (Signed)
Patient ID: Patricia Lloyd, female    DOB: August 24, 1960, 62 y.o.   MRN: [id number]  Chief Complaint  Patient presents with   Cough    Pt c/o Cough, headache, fever of 102.0. Husband tested positive 2 days ago with Flu A. Has tried acetaminophen which did not help.    HPI:      URI sx:  Pt c/o Cough, headache, fever of 102.0, reports sx hitting her suddenly yesterday. Husband tested positive 2 days ago with Flu A. Has tried acetaminophen which did not help, took husband's tessalon pearles which did help her cough a little.  Assessment & Plan:  1. Cough, unspecified type - rapid testing negative, sending tessalon pearles to help with cough, advised on use & SE, increase daily fluid intake.  - POCT Influenza A/B - benzonatate (TESSALON) 200 MG capsule; Take 1 capsule (200 mg total) by mouth 3 (three) times daily as needed for up to 10 days for cough.  Dispense: 30 capsule; Refill:0 - POC COVID-19  2. Flu-like symptoms - rapid covid & flu neg. husb positive for Flu A, pt with exact sx, will send Tamiflu, though pt advised it may give no relief. Continue OTC sinus/cold medications including generic Sudafed, Claritin D, or Mucinex per box directions, Ibuprofen up to 600mg  (do not take with Mobic) or generic Tylenol 1,000mg  every 6 hours. Use nasal saline spray tid, humidifier overnight. Increase water intake to at least 2 liters qd.   - POCT Influenza A/B - POC COVID-19 - oseltamivir (TAMIFLU) 75 MG capsule; Take 1 capsule (75 mg total) by mouth 2 (two) times daily.  Dispense: 10 capsule; Refill: 0   Subjective:    Outpatient Medications Prior to Visit  Medication Sig Dispense Refill   cetirizine-pseudoephedrine (ZYRTEC-D) 5-120 MG tablet One twice daily for congestion (Patient taking differently: 1 tablet daily. One twice daily for congestion) 20 tablet 1   cyclobenzaprine (FLEXERIL) 5 MG tablet Take 1 tablet (5 mg total) by mouth at bedtime as needed for muscle spasms. 30 tablet 1    ESTERIFIED ESTROGENS PO Take by mouth.     magnesium gluconate (MAGONATE) 500 MG tablet Take 500 mg by mouth 2 (two) times daily.     meloxicam (MOBIC) 15 MG tablet Take 1 tablet (15 mg total) by mouth daily as needed for pain. 90 tablet 0   Multiple Vitamins-Minerals (MULTI COMPLETE PO) Take by mouth.     Progesterone Micronized (PROGESTERONE PO) Take by mouth.     valACYclovir (VALTREX) 500 MG tablet Take 1 tab twice daily as needed for 5 days at first signs of symptoms. 30 tablet 1   predniSONE (DELTASONE) 10 MG tablet Take as directed per MD instructions 21 tablet 0   Facility-Administered Medications Prior to Visit  Medication Dose Route Frequency Provider Last Rate Last Admin   methylPREDNISolone acetate (DEPO-MEDROL) injection 80 mg  80 mg Intramuscular Once Meccariello, Bernita Raisin, MD       Past Medical History:  Diagnosis Date   Fibromyalgia    H/O fatigue    Insomnia    PONV (postoperative nausea and vomiting)    severe   Past Surgical History:  Procedure Laterality Date   CESAREAN SECTION  1995   CESAREAN SECTION  2000   DILATATION & CURETTAGE/HYSTEROSCOPY WITH TRUECLEAR N/A 01/01/2013   Procedure: DILATATION & CURETTAGE/HYSTEROSCOPY WITH TRUCLEAR;  Surgeon: Margarette Asal, MD;  Location: [facility];  Service: Gynecology;  Laterality: N/A;   SHOULDER SURGERY  05/03/2010  Left shoulder surgery, Dr.Doldorf     TMJ ARTHROPLASTY  1990   No Known Allergies    Objective:    Physical Exam Vitals and nursing note reviewed.  Constitutional:      Appearance: Normal appearance. She is ill-appearing.     Interventions: Face mask in place.  HENT:     Right Ear: Tympanic membrane and ear canal normal.     Left Ear: Tympanic membrane and ear canal normal.     Nose:     Right Sinus: Frontal sinus tenderness present.     Left Sinus: Frontal sinus tenderness present.     Mouth/Throat:     Mouth: Mucous membranes are moist.     Pharynx: Posterior oropharyngeal erythema present.  No pharyngeal swelling, oropharyngeal exudate or uvula swelling.     Tonsils: No tonsillar exudate or tonsillar abscesses.  Cardiovascular:     Rate and Rhythm: Normal rate and regular rhythm.  Pulmonary:     Effort: Pulmonary effort is normal.     Breath sounds: Normal breath sounds.  Musculoskeletal:        General: Normal range of motion.  Lymphadenopathy:     Head:     Right side of head: No preauricular or posterior auricular adenopathy.     Left side of head: No preauricular or posterior auricular adenopathy.     Cervical: No cervical adenopathy.  Skin:    General: Skin is warm and dry.  Neurological:     Mental Status: She is alert.  Psychiatric:        Mood and Affect: Mood normal.        Behavior: Behavior normal.    BP (!) 148/90 (BP Location: Left Arm, Patient Position: Sitting, Cuff Size: Large)   Pulse 78   Temp 98 F (36.7 C) (Temporal)   LMP 12/12/2012   SpO2 98%  Wt Readings from Last 3 Encounters:  12/21/21 147 lb 12.8 oz (67 kg)  11/23/21 142 lb (64.4 kg)  08/26/21 141 lb 6.4 oz (64.1 kg)       Jeanie Sewer, NP

## 2022-01-17 ENCOUNTER — Encounter: Payer: Self-pay | Admitting: Family Medicine

## 2022-01-26 DIAGNOSIS — M25561 Pain in right knee: Secondary | ICD-10-CM | POA: Diagnosis not present

## 2022-01-26 DIAGNOSIS — M25511 Pain in right shoulder: Secondary | ICD-10-CM | POA: Diagnosis not present

## 2022-01-26 DIAGNOSIS — M6259 Muscle wasting and atrophy, not elsewhere classified, multiple sites: Secondary | ICD-10-CM | POA: Diagnosis not present

## 2022-01-26 DIAGNOSIS — M545 Low back pain, unspecified: Secondary | ICD-10-CM | POA: Diagnosis not present

## 2022-01-30 DIAGNOSIS — M6259 Muscle wasting and atrophy, not elsewhere classified, multiple sites: Secondary | ICD-10-CM | POA: Diagnosis not present

## 2022-01-30 DIAGNOSIS — M25561 Pain in right knee: Secondary | ICD-10-CM | POA: Diagnosis not present

## 2022-01-30 DIAGNOSIS — M545 Low back pain, unspecified: Secondary | ICD-10-CM | POA: Diagnosis not present

## 2022-01-30 DIAGNOSIS — M25511 Pain in right shoulder: Secondary | ICD-10-CM | POA: Diagnosis not present

## 2022-02-03 DIAGNOSIS — M545 Low back pain, unspecified: Secondary | ICD-10-CM | POA: Diagnosis not present

## 2022-02-03 DIAGNOSIS — M25561 Pain in right knee: Secondary | ICD-10-CM | POA: Diagnosis not present

## 2022-02-03 DIAGNOSIS — M6259 Muscle wasting and atrophy, not elsewhere classified, multiple sites: Secondary | ICD-10-CM | POA: Diagnosis not present

## 2022-02-03 DIAGNOSIS — M25511 Pain in right shoulder: Secondary | ICD-10-CM | POA: Diagnosis not present

## 2022-02-10 DIAGNOSIS — M6259 Muscle wasting and atrophy, not elsewhere classified, multiple sites: Secondary | ICD-10-CM | POA: Diagnosis not present

## 2022-02-10 DIAGNOSIS — M25511 Pain in right shoulder: Secondary | ICD-10-CM | POA: Diagnosis not present

## 2022-02-10 DIAGNOSIS — M25561 Pain in right knee: Secondary | ICD-10-CM | POA: Diagnosis not present

## 2022-02-10 DIAGNOSIS — M545 Low back pain, unspecified: Secondary | ICD-10-CM | POA: Diagnosis not present

## 2022-02-13 DIAGNOSIS — M25561 Pain in right knee: Secondary | ICD-10-CM | POA: Diagnosis not present

## 2022-02-13 DIAGNOSIS — M25511 Pain in right shoulder: Secondary | ICD-10-CM | POA: Diagnosis not present

## 2022-02-13 DIAGNOSIS — M545 Low back pain, unspecified: Secondary | ICD-10-CM | POA: Diagnosis not present

## 2022-02-13 DIAGNOSIS — M6259 Muscle wasting and atrophy, not elsewhere classified, multiple sites: Secondary | ICD-10-CM | POA: Diagnosis not present

## 2022-02-24 DIAGNOSIS — M25511 Pain in right shoulder: Secondary | ICD-10-CM | POA: Diagnosis not present

## 2022-02-24 DIAGNOSIS — M25561 Pain in right knee: Secondary | ICD-10-CM | POA: Diagnosis not present

## 2022-02-24 DIAGNOSIS — M545 Low back pain, unspecified: Secondary | ICD-10-CM | POA: Diagnosis not present

## 2022-02-24 DIAGNOSIS — M6259 Muscle wasting and atrophy, not elsewhere classified, multiple sites: Secondary | ICD-10-CM | POA: Diagnosis not present

## 2022-03-01 DIAGNOSIS — M79671 Pain in right foot: Secondary | ICD-10-CM | POA: Diagnosis not present

## 2022-03-01 DIAGNOSIS — G5762 Lesion of plantar nerve, left lower limb: Secondary | ICD-10-CM | POA: Diagnosis not present

## 2022-03-01 DIAGNOSIS — G5761 Lesion of plantar nerve, right lower limb: Secondary | ICD-10-CM | POA: Diagnosis not present

## 2022-03-03 DIAGNOSIS — M545 Low back pain, unspecified: Secondary | ICD-10-CM | POA: Diagnosis not present

## 2022-03-03 DIAGNOSIS — M25561 Pain in right knee: Secondary | ICD-10-CM | POA: Diagnosis not present

## 2022-03-03 DIAGNOSIS — M25511 Pain in right shoulder: Secondary | ICD-10-CM | POA: Diagnosis not present

## 2022-03-03 DIAGNOSIS — M6259 Muscle wasting and atrophy, not elsewhere classified, multiple sites: Secondary | ICD-10-CM | POA: Diagnosis not present

## 2022-03-07 DIAGNOSIS — L2089 Other atopic dermatitis: Secondary | ICD-10-CM | POA: Diagnosis not present

## 2022-03-08 DIAGNOSIS — Z6825 Body mass index (BMI) 25.0-25.9, adult: Secondary | ICD-10-CM | POA: Diagnosis not present

## 2022-03-08 DIAGNOSIS — Z1151 Encounter for screening for human papillomavirus (HPV): Secondary | ICD-10-CM | POA: Diagnosis not present

## 2022-03-08 DIAGNOSIS — Z124 Encounter for screening for malignant neoplasm of cervix: Secondary | ICD-10-CM | POA: Diagnosis not present

## 2022-03-08 DIAGNOSIS — Z01419 Encounter for gynecological examination (general) (routine) without abnormal findings: Secondary | ICD-10-CM | POA: Diagnosis not present

## 2022-03-08 DIAGNOSIS — N760 Acute vaginitis: Secondary | ICD-10-CM | POA: Diagnosis not present

## 2022-03-20 ENCOUNTER — Other Ambulatory Visit: Payer: Self-pay | Admitting: Obstetrics and Gynecology

## 2022-03-20 DIAGNOSIS — Z803 Family history of malignant neoplasm of breast: Secondary | ICD-10-CM

## 2022-05-17 DIAGNOSIS — L815 Leukoderma, not elsewhere classified: Secondary | ICD-10-CM | POA: Diagnosis not present

## 2022-05-17 DIAGNOSIS — D225 Melanocytic nevi of trunk: Secondary | ICD-10-CM | POA: Diagnosis not present

## 2022-05-17 DIAGNOSIS — L578 Other skin changes due to chronic exposure to nonionizing radiation: Secondary | ICD-10-CM | POA: Diagnosis not present

## 2022-05-18 DIAGNOSIS — H53143 Visual discomfort, bilateral: Secondary | ICD-10-CM | POA: Diagnosis not present

## 2022-05-30 DIAGNOSIS — L0889 Other specified local infections of the skin and subcutaneous tissue: Secondary | ICD-10-CM | POA: Diagnosis not present

## 2022-05-30 DIAGNOSIS — L710 Perioral dermatitis: Secondary | ICD-10-CM | POA: Diagnosis not present

## 2022-06-15 ENCOUNTER — Encounter: Payer: Self-pay | Admitting: Family Medicine

## 2022-06-23 ENCOUNTER — Other Ambulatory Visit: Payer: Self-pay | Admitting: Family Medicine

## 2022-06-23 DIAGNOSIS — Z791 Long term (current) use of non-steroidal anti-inflammatories (NSAID): Secondary | ICD-10-CM

## 2022-07-25 ENCOUNTER — Encounter: Payer: Self-pay | Admitting: Obstetrics and Gynecology

## 2022-07-27 ENCOUNTER — Ambulatory Visit
Admission: RE | Admit: 2022-07-27 | Discharge: 2022-07-27 | Disposition: A | Discharge status: Home / Self Care | Payer: BC Managed Care – PPO | Source: Ambulatory Visit | Attending: Obstetrics and Gynecology | Admitting: Obstetrics and Gynecology

## 2022-07-27 DIAGNOSIS — Z803 Family history of malignant neoplasm of breast: Secondary | ICD-10-CM

## 2022-07-27 DIAGNOSIS — Z1239 Encounter for other screening for malignant neoplasm of breast: Secondary | ICD-10-CM | POA: Diagnosis not present

## 2022-07-27 MED ORDER — GADOPICLENOL 0.5 MMOL/ML IV SOLN
6.0000 mL | Freq: Once | INTRAVENOUS | Status: AC | PRN
  Administered 2022-07-27: 6 mL via INTRAVENOUS

## 2022-08-24 DIAGNOSIS — N760 Acute vaginitis: Secondary | ICD-10-CM | POA: Diagnosis not present

## 2022-08-24 DIAGNOSIS — R5383 Other fatigue: Secondary | ICD-10-CM | POA: Diagnosis not present

## 2022-08-24 DIAGNOSIS — N939 Abnormal uterine and vaginal bleeding, unspecified: Secondary | ICD-10-CM | POA: Diagnosis not present

## 2022-08-25 LAB — LAB REPORT - SCANNED: EGFR: 80

## 2022-08-31 IMAGING — CR DG LUMBAR SPINE 2-3V
2 series · 2 of 2 positions shown · non-contrast
Comparison: February 26, 2015.

CLINICAL DATA: Acute on chronic low back pain with bilateral
radiculopathy.

EXAM:
LUMBAR SPINE - 2-3 VIEW

[w l-spine a.p. *]
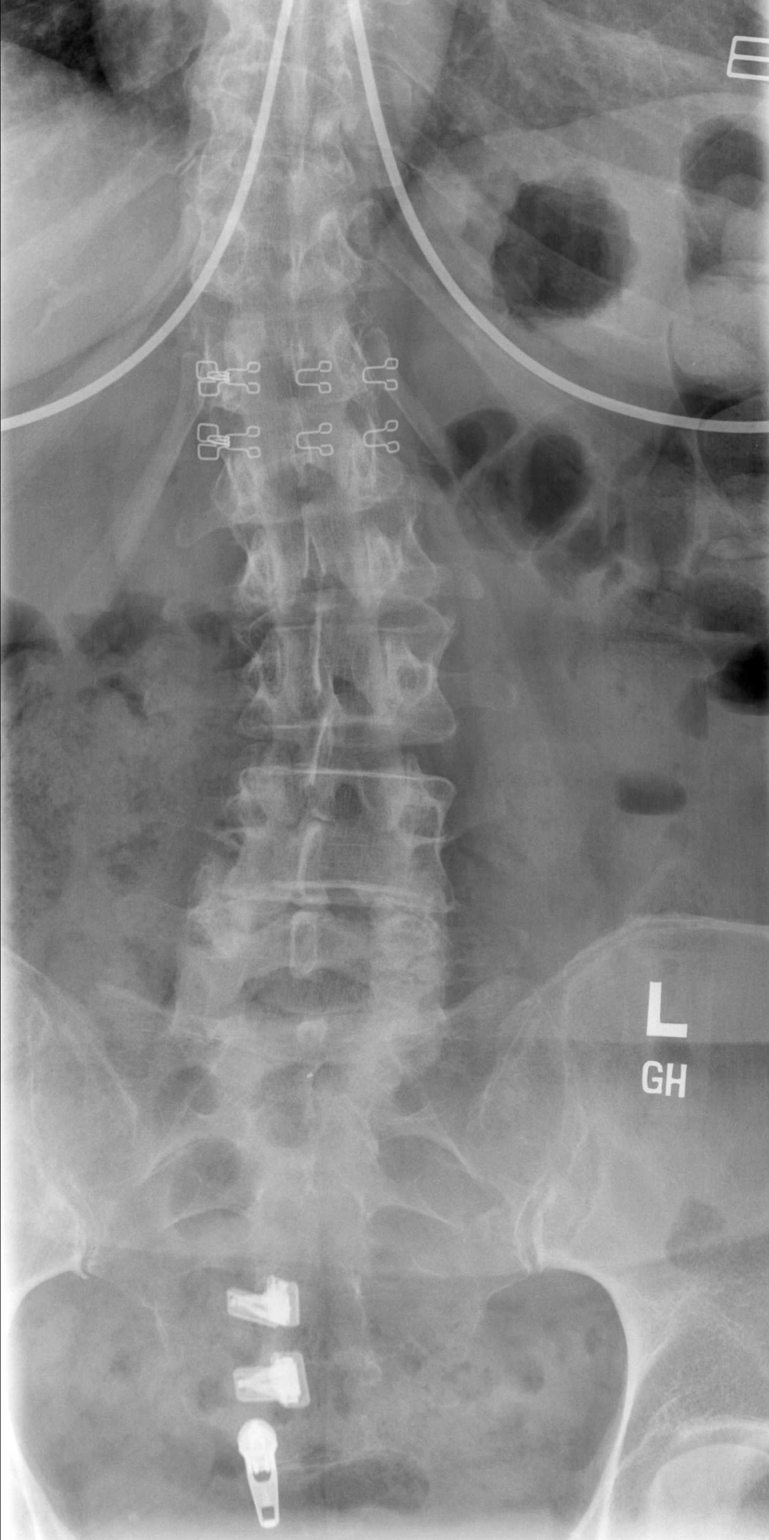

[w l-spine lat *]
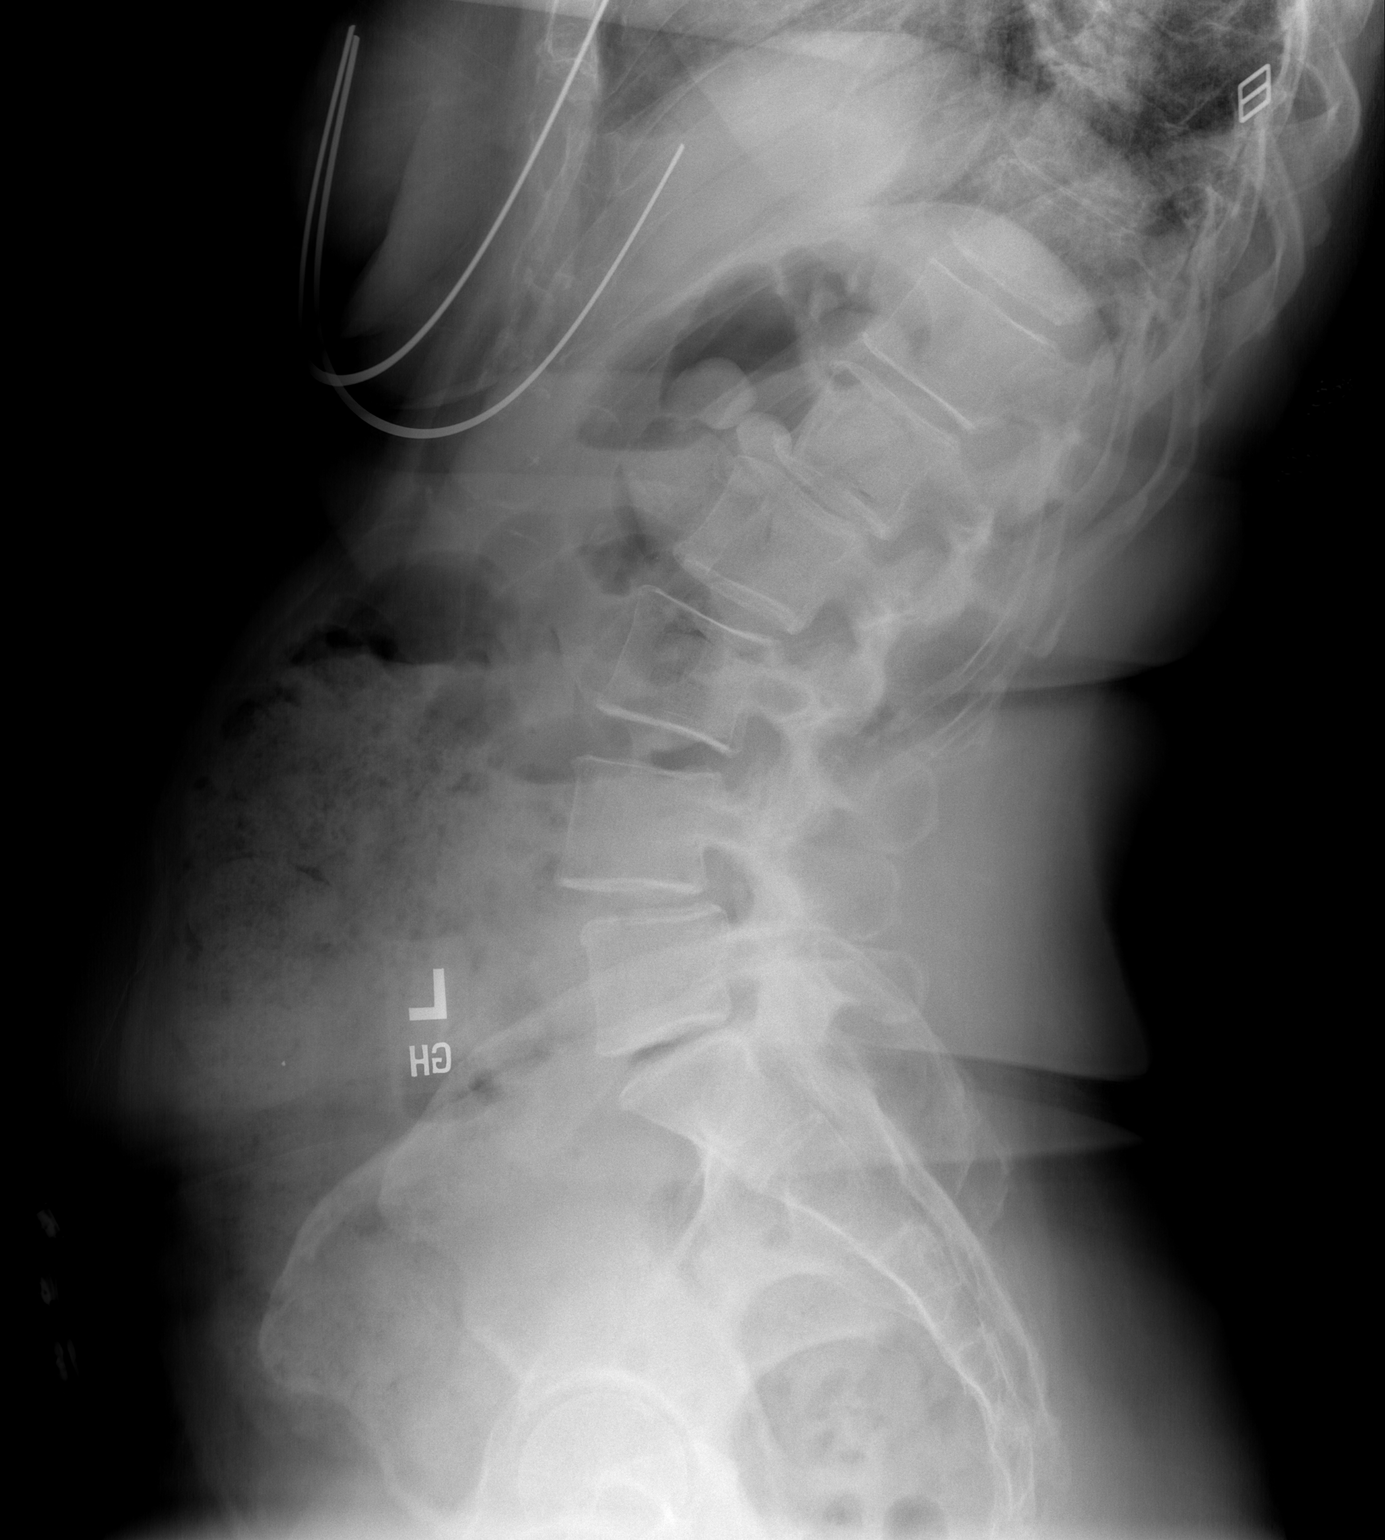

[2 of 2 positions shown; findings below may reference images not displayed]

FINDINGS: Mild levoscoliosis of lumbar spine is noted. Minimal grade 1
retrolisthesis of L1-2 is noted secondary to severe degenerative
disc disease at this level. Mild grade 1 anterolisthesis of L4-5 is
noted secondary to posterior facet joint hypertrophy. Moderate
degenerative disc disease is noted at L5-S1.
IMPRESSION: Multilevel degenerative disc disease.  No acute abnormality seen.

## 2022-08-31 IMAGING — CR DG CERVICAL SPINE 2 OR 3 VIEWS
3 series · 3 of 3 positions shown · non-contrast
Comparison: X-ray cervical 09/11/2014; MR cervical 04/02/2010.

CLINICAL DATA: Cervical radiculopathy. Acute and chronic neck pain
radiating down left shoulder for 1 week.

EXAM:
CERVICAL SPINE - 2-3 VIEW

[w c-spine lat]
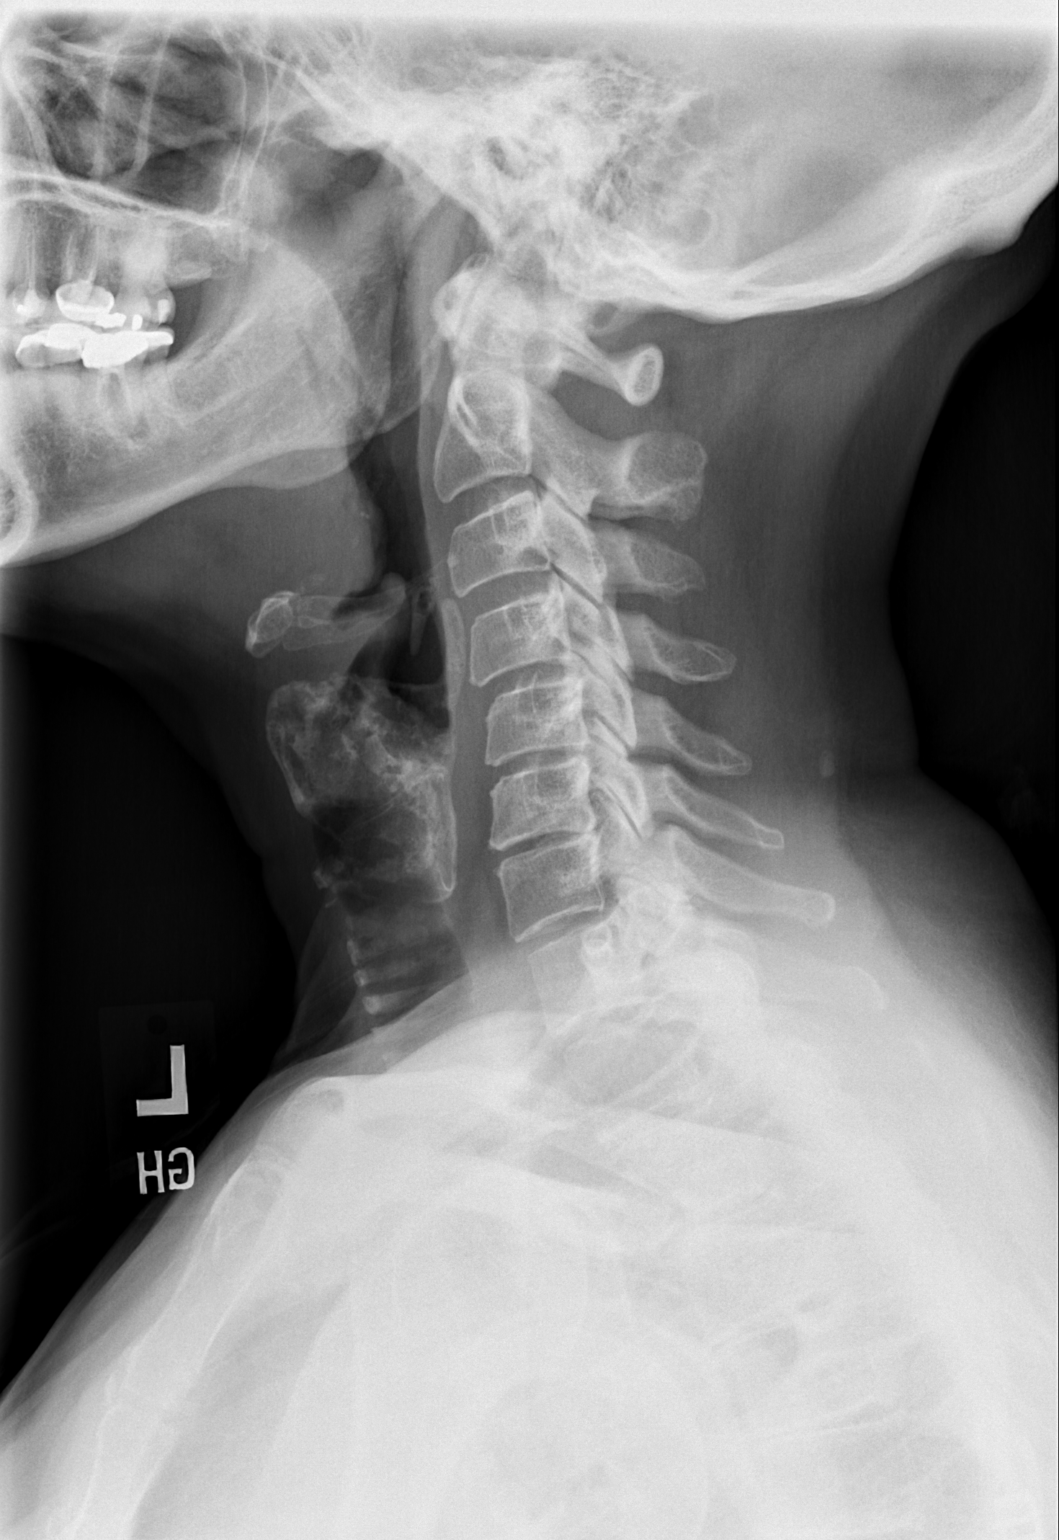

[w c-spine a.p. *]
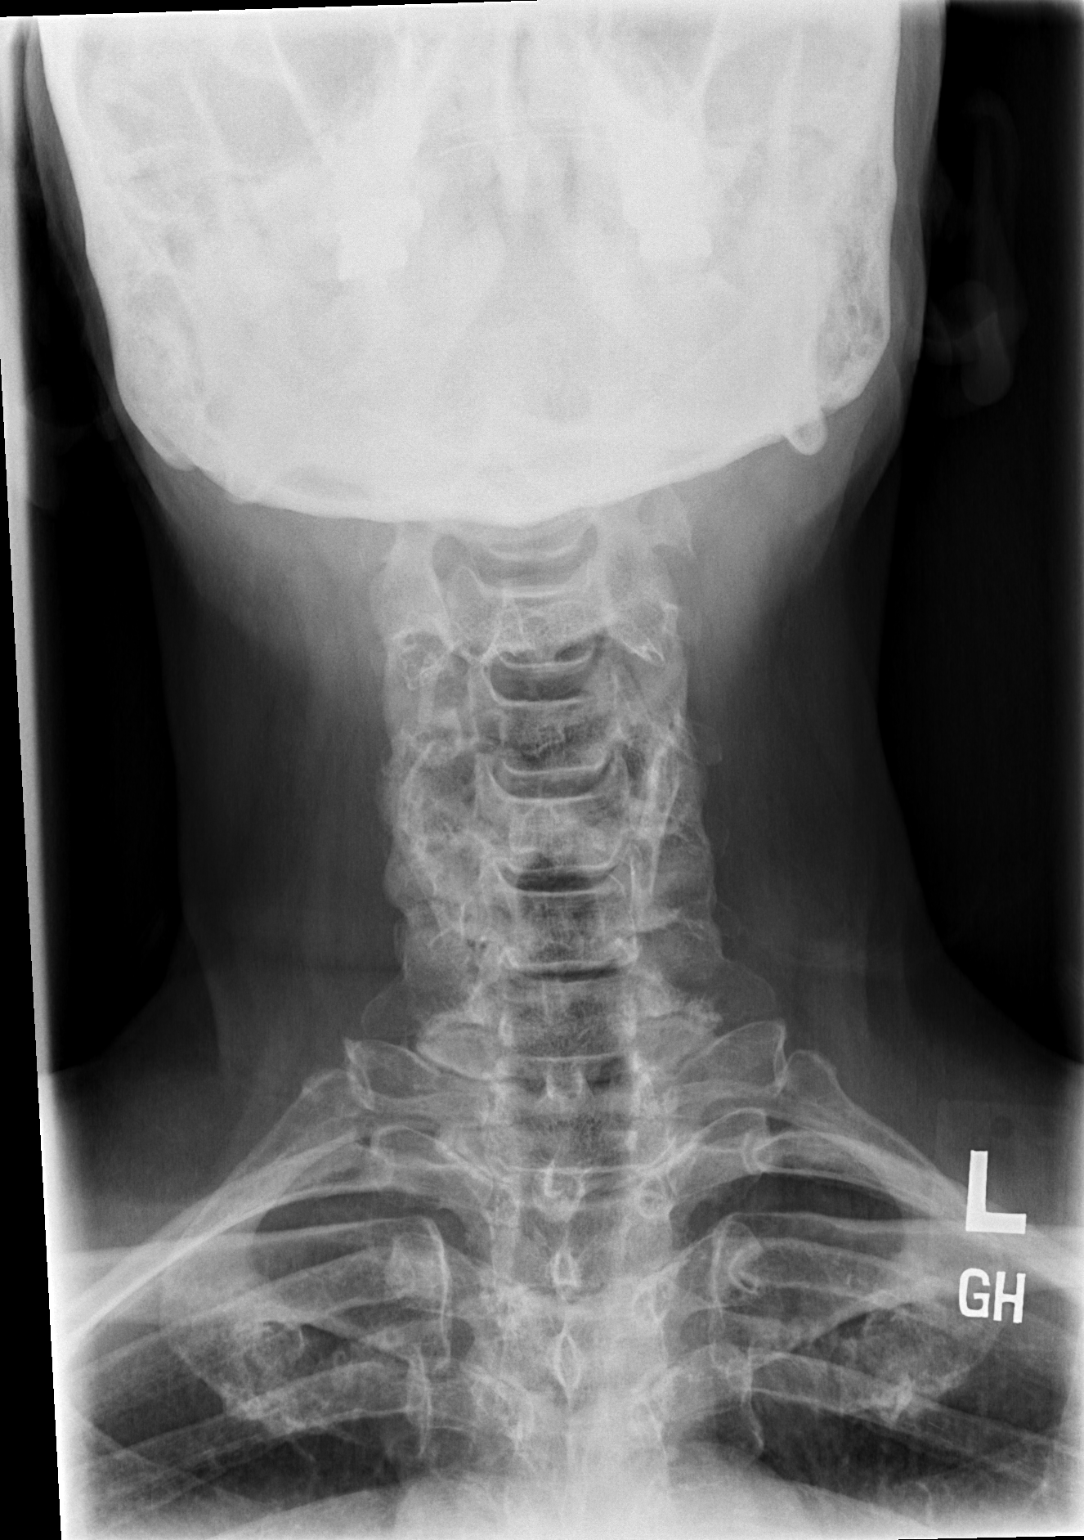

[w c-spine odontoid *]
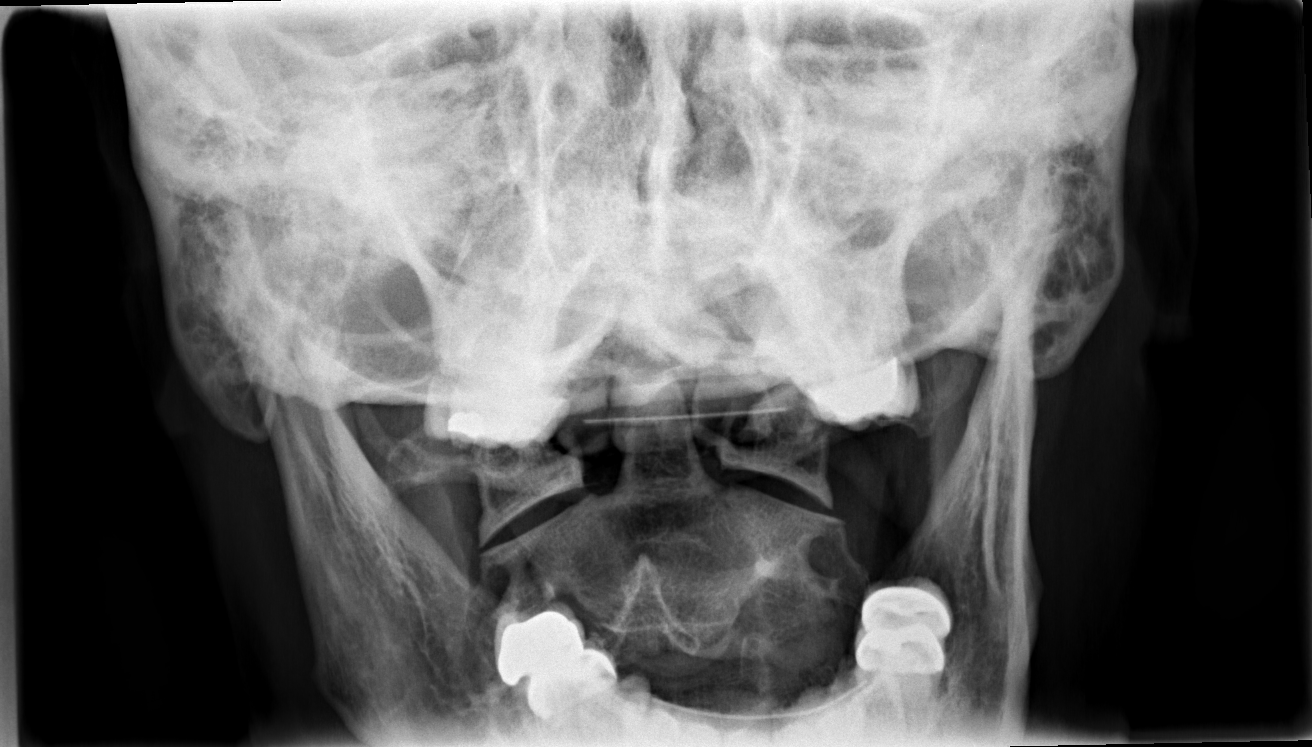

[3 of 3 positions shown; findings below may reference images not displayed]

FINDINGS: Mild reversal of cervical lordosis. Vertebral body heights are
maintained. Mild disc space narrowing C4-C5, C5-C6 and C6-C7.
Similar compared to previous. Foramen grossly patent. Dens and
lateral masses are within normal limits.
IMPRESSION: Straightening of the cervical spine with mild multilevel
degenerative change

## 2022-08-31 NOTE — Progress Notes (Unsigned)
[facility] [address] [place] [address] Phone: [phone] Subjective:   Bruce Donath, am serving as a scribe for Dr. Antoine Primas.  I'm seeing this patient by the request  of:  Deeann Saint, MD  CC: foot pain    [id number]  08/29/2018 Orthotics noted.  Custom molds made today.  Tolerated the procedure well.  If continued and pain patient does have a neuroma and may need injection.  Patient given injection today. Tolerated the procedure well. Patient wanted to hold on injection of the neuroma. Discussed which activities to do which wants to avoid. Increase activity as tolerated. Follow-up again in 4 to 8 weeks if continues to have pain I would consider the injection in the neuroma. We did discuss the possibility of physical therapy for the left foot and ankle.   Updated 09/04/2022 Patricia Lloyd is a 62 y.o. female coming in with complaint of R foot and L hip pain. Patient states that she was told that she has a neuroma in the R foot. Pain on bottom of foot and 2nd toe is numb.   Bone density scan this morning.   L hip pain. Slight tear of labrum in L hip and osteopenia. Pain radiates up into the ribs and pain will be very sharp at the ribs when the hip hurts.   MRI L hip 2021 IMPRESSION: 1. Mild bilateral hip joint degenerative changes for age. No stress fracture or AVN. 2. Findings highly suspicious for a superior anterior labral tear involving the left hip. 3. Mild bilateral peritendinosis but no trochanteric bursitis      Past Medical History:  Diagnosis Date   Fibromyalgia    H/O fatigue    Insomnia    PONV (postoperative nausea and vomiting)    severe   Past Surgical History:  Procedure Laterality Date   CESAREAN SECTION  1995   CESAREAN SECTION  2000   DILATATION & CURETTAGE/HYSTEROSCOPY WITH TRUECLEAR N/A 01/01/2013   Procedure: DILATATION & CURETTAGE/HYSTEROSCOPY WITH TRUCLEAR;  Surgeon: Meriel Pica, MD;  Location: [facility];  Service: Gynecology;  Laterality: N/A;   SHOULDER SURGERY  05/03/2010   Left shoulder surgery, Dr.Doldorf     TMJ ARTHROPLASTY  1990   Social History   Socioeconomic History   Marital status: Single    Spouse name: Not on file   Number of children: 2   Years of education: COLLEGE   Highest education level: Not on file  Occupational History   Occupation: [employer]    Employer: [employer]  Tobacco Use   Smoking status: Former   Smokeless tobacco: Never   Tobacco comments:    Social smoker quit at age 11  Substance and Sexual Activity   Alcohol use: Yes    Alcohol/week: 0.0 standard drinks of alcohol    Comment: 2 GLASSES WINE DAILY    Drug use: No   Sexual activity: Not on file  Other Topics Concern   Not on file  Social History Narrative   Lives at home w/ her fiance    Right-handed   Caffeine: 1 cup of coffee per day   Social Determinants of Health   Financial Resource Strain: Not on file  Food Insecurity: Not on file  Transportation Needs: Not on file  Physical Activity: Not on file  Stress: Not on file  Social Connections: Not on file   No Known Allergies Family History  Problem Relation Age of Onset  Cancer Mother        Pancreatic    Hypertension Father    Cancer Maternal Aunt     Current Outpatient Medications (Endocrine & Metabolic):    ESTERIFIED ESTROGENS PO, Take by mouth.   Progesterone Micronized (PROGESTERONE PO), Take by mouth.  Current Facility-Administered Medications (Endocrine & Metabolic):    methylPREDNISolone acetate (DEPO-MEDROL) injection 80 mg    Current Outpatient Medications (Respiratory):    cetirizine-pseudoephedrine (ZYRTEC-D) 5-120 MG tablet, One twice daily for congestion (Patient taking differently: 1 tablet daily. One twice daily for congestion)   Current Outpatient Medications (Analgesics):    meloxicam (MOBIC) 15 MG tablet, Take 1 tablet (15 mg total) by mouth daily as needed  for pain.     Current Outpatient Medications (Other):    cyclobenzaprine (FLEXERIL) 5 MG tablet, Take 1 tablet (5 mg total) by mouth at bedtime as needed for muscle spasms.   magnesium gluconate (MAGONATE) 500 MG tablet, Take 500 mg by mouth 2 (two) times daily.   Multiple Vitamins-Minerals (MULTI COMPLETE PO), Take by mouth.   oseltamivir (TAMIFLU) 75 MG capsule, Take 1 capsule (75 mg total) by mouth 2 (two) times daily.   valACYclovir (VALTREX) 500 MG tablet, Take 1 tab twice daily as needed for 5 days at first signs of symptoms.    Reviewed prior external information including notes and imaging from  primary care provider As well as notes that were available from care everywhere and other healthcare systems.  Past medical history, social, surgical and family history all reviewed in electronic medical record.  No pertanent information unless stated regarding to the chief complaint.   Review of Systems:  No headache, visual changes, nausea, vomiting, diarrhea, constipation, dizziness, askin rash, fevers, chills, night sweats, weight loss, swollen lymph nodes, body aches, joint swelling, chest pain, shortness of breath, mood changes. POSITIVE muscle aches, abdominal pain  Objective  Blood pressure 118/82, pulse 75, height 5\' 4"  (1.626 m), weight 142 lb (64.4 kg), last menstrual period 12/12/2012, SpO2 97%.   General: No apparent distress alert and oriented x3 mood and affect normal, dressed appropriately.  HEENT: Pupils equal, extraocular movements intact  Respiratory: Patient's speak in full sentences and does not appear short of breath  Cardiovascular: No lower extremity edema, non tender, no erythema  Back exam shows the patient does have tenderness to palpation more over the paraspinal musculature of the lumbar spine and more CVA tenderness noted. Foot exam does show the breakdown of the transverse arch but nontender on exam earlier today.   Impression and Recommendations:     The above documentation has been reviewed and is accurate and complete Judi Saa, DO

## 2022-09-04 ENCOUNTER — Ambulatory Visit (INDEPENDENT_AMBULATORY_CARE_PROVIDER_SITE_OTHER): Payer: BC Managed Care – PPO

## 2022-09-04 ENCOUNTER — Ambulatory Visit: Payer: BC Managed Care – PPO | Admitting: Family Medicine

## 2022-09-04 ENCOUNTER — Other Ambulatory Visit: Payer: Self-pay

## 2022-09-04 VITALS — BP 118/82 | HR 75 | Ht 64.0 in | Wt 142.0 lb

## 2022-09-04 DIAGNOSIS — R1012 Left upper quadrant pain: Secondary | ICD-10-CM

## 2022-09-04 DIAGNOSIS — M79671 Pain in right foot: Secondary | ICD-10-CM

## 2022-09-04 DIAGNOSIS — M2550 Pain in unspecified joint: Secondary | ICD-10-CM

## 2022-09-04 DIAGNOSIS — Z13820 Encounter for screening for osteoporosis: Secondary | ICD-10-CM | POA: Diagnosis not present

## 2022-09-04 DIAGNOSIS — G5761 Lesion of plantar nerve, right lower limb: Secondary | ICD-10-CM

## 2022-09-04 DIAGNOSIS — M797 Fibromyalgia: Secondary | ICD-10-CM

## 2022-09-04 NOTE — Patient Instructions (Addendum)
Arnica lotion 2x a day HOKA Recovery Sandals Xray today Labs today See me again in 6 weeks

## 2022-09-05 LAB — COMPREHENSIVE METABOLIC PANEL
ALT: 11 U/L (ref 0–35)
AST: 21 U/L (ref 0–37)
Albumin: 4 g/dL (ref 3.5–5.2)
Alkaline Phosphatase: 44 U/L (ref 39–117)
BUN: 22 mg/dL (ref 6–23)
CO2: 29 meq/L (ref 19–32)
Calcium: 9.3 mg/dL (ref 8.4–10.5)
Chloride: 104 mEq/L (ref 96–112)
Creatinine, Ser: 1.05 mg/dL (ref 0.40–1.20)
GFR: 56.19 mL/min — ABNORMAL LOW (ref >60.00)
Glucose, Bld: 92 mg/dL (ref 70–99)
Potassium: 4.3 mEq/L (ref 3.5–5.1)
Sodium: 141 meq/L (ref 135–145)
Total Bilirubin: 0.3 mg/dL (ref 0.2–1.2)
Total Protein: 7.3 g/dL (ref 6.0–8.3)

## 2022-09-05 LAB — CBC WITH DIFFERENTIAL/PLATELET
Basophils Absolute: 0.1 10*3/uL (ref 0.0–0.1)
Basophils Relative: 1.1 % (ref 0.0–3.0)
Eosinophils Absolute: 0.3 10*3/uL (ref 0.0–0.7)
Eosinophils Relative: 4.1 % (ref 0.0–5.0)
HCT: 38 % (ref 36.0–46.0)
Hemoglobin: 12.6 g/dL (ref 12.0–15.0)
Lymphocytes Relative: 31.6 % (ref 12.0–46.0)
Lymphs Abs: 2.3 10*3/uL (ref 0.7–4.0)
MCHC: 33.1 g/dL (ref 30.0–36.0)
MCV: 90.3 fl (ref 78.0–100.0)
Monocytes Absolute: 0.7 10*3/uL (ref 0.1–1.0)
Monocytes Relative: 9.2 % (ref 3.0–12.0)
Neutro Abs: 3.9 10*3/uL (ref 1.4–7.7)
Neutrophils Relative %: 54 % (ref 43.0–77.0)
Platelets: 249 10*3/uL (ref 150.0–400.0)
RBC: 4.21 Mil/uL (ref 3.87–5.11)
RDW: 13 % (ref 11.5–15.5)
WBC: 7.2 10*3/uL (ref 4.0–10.5)

## 2022-09-05 LAB — SEDIMENTATION RATE: Sed Rate: 18 mm/h (ref 0–30)

## 2022-09-05 LAB — IBC PANEL
Iron: 72 ug/dL (ref 42–145)
Saturation Ratios: 23.5 % (ref 20.0–50.0)
TIBC: 306.6 ug/dL (ref 250.0–450.0)
Transferrin: 219 mg/dL (ref 212.0–360.0)

## 2022-09-05 LAB — URIC ACID: Uric Acid, Serum: 4.9 mg/dL (ref 2.4–7.0)

## 2022-09-05 LAB — PTH, INTACT AND CALCIUM
Calcium: 9.3 mg/dL (ref 8.6–10.4)
PTH: 27 pg/mL (ref 16–77)

## 2022-09-05 LAB — FERRITIN: Ferritin: 79.7 ng/mL (ref 10.0–291.0)

## 2022-09-05 NOTE — Assessment & Plan Note (Signed)
Underlying fibromyalgia.  Patient does have a little significant arthritic changes in multiple other joints.  Discussed with patient to continue to monitor and can consider other medications.  At the moment patient wants to see if we can improve the back pain more and is awaiting the KUB.  Getting labs as well to further evaluate kidney function.

## 2022-09-05 NOTE — Assessment & Plan Note (Signed)
History of the neuroma previously.  Discussed with patient that there is a possibility of this.  Will monitor.  Worsening pain can consider injections but at the moment I think the more concerning aspect is her back pain.  Will get KUB to rule out anything such as a kidney stone that could be contributing.  Has had difficulty with urinary incontinence.  Will try some home exercises otherwise if normal.  If worsening pain advanced imaging would be warranted.

## 2022-09-13 ENCOUNTER — Encounter: Payer: Self-pay | Admitting: Family Medicine

## 2022-09-15 ENCOUNTER — Telehealth: Payer: Self-pay | Admitting: Family Medicine

## 2022-09-15 NOTE — Telephone Encounter (Signed)
Would need to see new imaging or report in order to determine if it was the same rib.  It appears pt was scheduled for an appt to discuss further.

## 2022-09-15 NOTE — Telephone Encounter (Signed)
Pt called requesting to speak to CMA.   Pt was asked if it was regarding a medication refill, as I could help with that. Pt stated it was for something else and would rather speak directly to CMA.   Pt asked that CMA please call back at their earliest convenience.

## 2022-09-15 NOTE — Telephone Encounter (Signed)
Pt called again and stated she would like a call back by the end of the day. Pt added it is regarding clarification of test results.

## 2022-09-18 ENCOUNTER — Encounter: Payer: Self-pay | Admitting: Family Medicine

## 2022-09-18 ENCOUNTER — Ambulatory Visit: Payer: BC Managed Care – PPO | Admitting: Family Medicine

## 2022-09-18 VITALS — BP 110/80 | HR 68 | Temp 98.8°F | Ht 64.0 in | Wt 141.6 lb

## 2022-09-18 DIAGNOSIS — R109 Unspecified abdominal pain: Secondary | ICD-10-CM | POA: Diagnosis not present

## 2022-09-18 DIAGNOSIS — N950 Postmenopausal bleeding: Secondary | ICD-10-CM | POA: Diagnosis not present

## 2022-09-18 DIAGNOSIS — Z8781 Personal history of (healed) traumatic fracture: Secondary | ICD-10-CM | POA: Diagnosis not present

## 2022-09-18 DIAGNOSIS — M797 Fibromyalgia: Secondary | ICD-10-CM

## 2022-09-18 NOTE — Progress Notes (Signed)
Established Patient Office Visit   Subjective  Patient ID: Patricia Lloyd, female    DOB: May 19, 1960  Age: 62 y.o. MRN: [id number]  Chief Complaint  Patient presents with   Flank Pain    Left 9th cracked rib nondisplaced rate of pain 6 out of 10    Pt is a 62 yo female seen for acute on chronic concern.  Pt seen by Sports med for new insoles.  Happened to mention LUQ/flank pain.  A 1 view xray of abd to evaluate for renal calculi was ordered and mentioned nondisplaced ninth L rib fx.  Pt was concerned that she may have re-fx'd a rib.  A few yrs ago CXR was obtained due to pt having ongoing L flank pain.  Pt was initially told that her symptoms were related to a labral tear in L hip.  CXR 01/29/20 with chronic 10th rib fx.  CT chest on 03/12/20 noted healed fx of 9th rib with minimal deformity.  Patient denies recent injury or change in activity.  Patient stays active, does Pilates routinely.  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  Patient notes pain has been so bad recently that it is uncomfortable to lay flat or on her left side at night.  Sitting also causes discomfort, 6/10 pain noted as "a grabbing sensation" and an "ache" that radiates from anterior to posterior rib cage.  Pain is not sharp.  Patient inquires about medication options to help with discomfort.  Was taking Mobic daily but recently advised renal function was elevated.  Patient states is difficult to go without Mobic.  Tried alternating Tylenol.  Patient unable to take gabapentin.  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  Patient is in physical therapy.  In the past had dry needling and acupuncture which were not helpful.   Past Medical History:  Diagnosis Date   Fibromyalgia    H/O fatigue    Insomnia    PONV (postoperative nausea and vomiting)    severe   Past Surgical History:  Procedure Laterality Date   CESAREAN  SECTION  1995   CESAREAN SECTION  2000   DILATATION & CURETTAGE/HYSTEROSCOPY WITH TRUECLEAR N/A 01/01/2013   Procedure: DILATATION & CURETTAGE/HYSTEROSCOPY WITH TRUCLEAR;  Surgeon: Meriel Pica, MD;  Location: [facility];  Service: Gynecology;  Laterality: N/A;   SHOULDER SURGERY  05/03/2010   Left shoulder surgery, Dr.Doldorf     TMJ ARTHROPLASTY  1990   Social History   Tobacco Use   Smoking status: Former   Smokeless tobacco: Never   Tobacco comments:    Social smoker quit at age 33  Substance Use Topics   Alcohol use: Yes    Alcohol/week: 0.0 standard drinks of alcohol    Comment: 2 GLASSES WINE DAILY    Drug use: No   Family History  Problem Relation Age of Onset   Cancer Mother        Pancreatic    Hypertension Father    Cancer Maternal Aunt    No Known Allergies    ROS Negative unless stated above    Objective:     BP 110/80 (BP Location: Left Arm, Patient Position: Sitting, Cuff Size: Normal)   Pulse 68   Temp 98.8 F (37.1 C) (Oral)   Ht 5\' 4"  (1.626 m)   Wt 141 lb 9.6 oz (64.2 kg)   LMP 12/12/2012  SpO2 96%   BMI 24.31 kg/m    Physical Exam Constitutional:      General: She is not in acute distress.    Appearance: Normal appearance.  HENT:     Head: Normocephalic and atraumatic.     Nose: Nose normal.     Mouth/Throat:     Mouth: Mucous membranes are moist.  Cardiovascular:     Rate and Rhythm: Normal rate and regular rhythm.  Pulmonary:     Effort: Pulmonary effort is normal.  Chest:     Chest wall: Tenderness present.    Skin:    General: Skin is warm and dry.  Neurological:     Mental Status: She is alert and oriented to person, place, and time.     No results found for any visits on 09/18/22.    Assessment & Plan:  Left flank pain -     Ambulatory referral to Pain Clinic  History of rib fracture -     Ambulatory referral to Pain Clinic  Fibromyalgia -     Ambulatory referral to Pain Clinic  Acute on chronic left  flank pain worse in the last few weeks.  Patient reassured as previous imaging suggest minimally displaced rib fracture initially noted in 2022 has since healed.  Discussed possible causes of symptoms including endocrine stool muscle strain/spasms, fibromyalgia flare, intercostal nerve impingement.  Though less likely also consider splenic enlargement, constipation, pneumonia.  Discussed though inconsistent numbering of ribs (9 versus 10) studies are describing the same singular fracture.  No evidence of new fracture based on DG abdomen 1 view from 09/04/2022.  Discussed referral to pain management for nonmedication therapy options.  Discussed using topical medications, heat.  Limit NSAID use given elevated creatinine.   Return if symptoms worsen or fail to improve.   Deeann Saint, MD

## 2022-10-10 ENCOUNTER — Ambulatory Visit: Payer: BC Managed Care – PPO | Admitting: Sports Medicine

## 2022-10-10 VITALS — BP 130/88 | Ht 64.0 in | Wt 142.0 lb

## 2022-10-10 DIAGNOSIS — M51369 Other intervertebral disc degeneration, lumbar region without mention of lumbar back pain or lower extremity pain: Secondary | ICD-10-CM

## 2022-10-10 DIAGNOSIS — G5761 Lesion of plantar nerve, right lower limb: Secondary | ICD-10-CM | POA: Diagnosis not present

## 2022-10-10 DIAGNOSIS — M419 Scoliosis, unspecified: Secondary | ICD-10-CM | POA: Diagnosis not present

## 2022-10-10 DIAGNOSIS — S2232XG Fracture of one rib, left side, subsequent encounter for fracture with delayed healing: Secondary | ICD-10-CM

## 2022-10-10 MED ORDER — MELOXICAM 15 MG PO TABS: 15.0000 mg | ORAL_TABLET | Freq: Every day | ORAL | 1 refills | Status: AC | PRN

## 2022-10-10 NOTE — Progress Notes (Signed)
Chief complaint: Left rib cage and flank pain; bilateral hip pain.  And right forefoot pain with a history of Morton's neuroma  Patient is well-known to me and I have seen her off and on over a number of years.  She has been seeing Dr. Ayesha Mohair for the rib cage and flank pain but this still continues to cause her daily pain.  She drives back and forth from [place] to [place] frequently because of vomiting stores in both locations.  Driving makes the symptoms worse.  She may have had a rib fracture at 1 point but that has been more than 2 years ago.  Most recently this is not shown on x-ray.  She has bilateral hip pain that hurts in her groin.  This is improved somewhat with doing Pilates over the years.  Of note I had done MRIs which showed some labral pathology in the left hip and some mild arthritis of both hip joints.  Foot pain on the right and symptoms consistent with a Morton's neuroma.  Dr. Ayesha Mohair had placed her in some orthotic type insoles that have been helpful.  However the forefoot pain and neuroma symptoms on the right foot persist and she wonders if she could do something to lessen these.  Physical exam Pleasant white female in no acute distress BP 130/88   Ht 5\' 4"  (1.626 m)   Wt 142 lb (64.4 kg)   LMP 12/12/2012   BMI 24.37 kg/m   Palpation up and down her left rib cage does not reveal any significant bony tenderness No muscle spasm is identified She has full range of motion In a lying position if I put pressure over her lower thoracic spine she feels some pain  Hip range of motion is pretty well-preserved bilaterally and does not cause pain or significant limitation of internal rotation  Right foot shows a drop of the plantar plate at the metatarsal heads.  This reverses the distal transverse arch.  There is some correction for this on her current orthotics but probably not enough.

## 2022-10-10 NOTE — Assessment & Plan Note (Signed)
She has had a documented Morton's neuroma that did improve some with the orthotics that were given to her by Dr. Katrinka Blazing.  However with the degree of flattening and reversing her about the plantar plate distally I believe we need to do additional padding with a metatarsal cookie this placed actually over the metatarsal heads and not behind them.  I placed this in her orthotic today and she immediately felt some pressure relief from her forefoot.  She is going to try this over the next month and see if that type of correction may lessen the symptoms she has in her feet.

## 2022-10-10 NOTE — Assessment & Plan Note (Signed)
I do not think the rib fractures are still a factor after this much time.  However I wonder if this helped trigger some of her symptoms.  When I review her spine films she has significant scoliosis with a convex curve in the area where she feels most of her pain.  I am wondering if that means that her symptoms now are more referred pain from the scoliotic change along with some degenerative disc disease in lower thoracic and lumbar spine.  With this in mind I suggested a number of rotational exercises.  We will add some stretches.  Okay to continue her meloxicam every other day and Tylenol as needed for her pain.  We will see if the stretches lessen her symptoms.

## 2022-10-13 NOTE — Progress Notes (Deleted)
[facility] [address] [place] [address] Phone: [phone] Subjective:    I'm seeing this patient by the request  of:  Deeann Saint, MD  CC:   [id number]  09/04/2022 Underlying fibromyalgia. Patient does have a little significant arthritic changes in multiple other joints. Discussed with patient to continue to monitor and can consider other medications. At the moment patient wants to see if we can improve the back pain more and is awaiting the KUB. Getting labs as well to further evaluate kidney function.   Update 10/16/2022 Patricia Lloyd is a 62 y.o. female coming in with complaint of R foot pain. Patient states      Past Medical History:  Diagnosis Date   Fibromyalgia    H/O fatigue    Insomnia    PONV (postoperative nausea and vomiting)    severe   Past Surgical History:  Procedure Laterality Date   CESAREAN SECTION  1995   CESAREAN SECTION  2000   DILATATION & CURETTAGE/HYSTEROSCOPY WITH TRUECLEAR N/A 01/01/2013   Procedure: DILATATION & CURETTAGE/HYSTEROSCOPY WITH TRUCLEAR;  Surgeon: Meriel Pica, MD;  Location: [facility];  Service: Gynecology;  Laterality: N/A;   SHOULDER SURGERY  05/03/2010   Left shoulder surgery, Dr.Doldorf     TMJ ARTHROPLASTY  1990   Social History   Socioeconomic History   Marital status: Single    Spouse name: Not on file   Number of children: 2   Years of education: COLLEGE   Highest education level: Not on file  Occupational History   Occupation: Dress Code    Employer: [employer]  Tobacco Use   Smoking status: Former   Smokeless tobacco: Never   Tobacco comments:    Social smoker quit at age 39  Substance and Sexual Activity   Alcohol use: Yes    Alcohol/week: 0.0 standard drinks of alcohol    Comment: 2 GLASSES WINE DAILY    Drug use: No   Sexual activity: Not on file  Other Topics Concern   Not on file  Social History Narrative   Lives at home w/ her fiance     Right-handed   Caffeine: 1 cup of coffee per day   Social Determinants of Health   Financial Resource Strain: Not on file  Food Insecurity: Not on file  Transportation Needs: Not on file  Physical Activity: Not on file  Stress: Not on file  Social Connections: Not on file   No Known Allergies Family History  Problem Relation Age of Onset   Cancer Mother        Pancreatic    Hypertension Father    Cancer Maternal Aunt     Current Outpatient Medications (Endocrine & Metabolic):    ESTERIFIED ESTROGENS PO, Take by mouth.   estradiol (VIVELLE-DOT) 0.05 MG/24HR patch, Place 1 patch onto the skin 2 (two) times a week.   Progesterone Micronized (PROGESTERONE PO), Take by mouth.  Current Facility-Administered Medications (Endocrine & Metabolic):    methylPREDNISolone acetate (DEPO-MEDROL) injection 80 mg    Current Outpatient Medications (Respiratory):    cetirizine-pseudoephedrine (ZYRTEC-D) 5-120 MG tablet, One twice daily for congestion (Patient taking differently: 1 tablet daily. One twice daily for congestion)   diphenhydrAMINE (BENADRYL) 25 mg capsule, Take by mouth.   Current Outpatient Medications (Analgesics):    ibuprofen (ADVIL) 200 MG tablet, Take by mouth.   meloxicam (MOBIC) 15 MG tablet, Take 1 tablet (15 mg total) by mouth daily as needed  for pain.     Current Outpatient Medications (Other):    ALPRAZolam (XANAX) 0.25 MG tablet, Take by mouth.   amoxicillin (AMOXIL) 875 MG tablet, Take by mouth. (Patient not taking: Reported on 09/18/2022)   B Complex Vitamins (VITAMIN B-COMPLEX) TABS, Take by mouth.   Biotin 1000 MCG tablet, Take by mouth.   cyclobenzaprine (FLEXERIL) 5 MG tablet, Take 1 tablet (5 mg total) by mouth at bedtime as needed for muscle spasms.   fluconazole (DIFLUCAN) 150 MG tablet, 1 po stat, repeat x 1 in 48 hours   magnesium gluconate (MAGONATE) 500 MG tablet, Take 500 mg by mouth 2 (two) times daily.   metroNIDAZOLE (METROGEL) 0.75 % vaginal  gel, Insert 1 applicatorful every day by vaginal route for 5 days.   Multiple Vitamin (MULTI VITAMIN DAILY PO),    Multiple Vitamins-Minerals (MULTI COMPLETE PO), Take by mouth.   oseltamivir (TAMIFLU) 75 MG capsule, Take 1 capsule (75 mg total) by mouth 2 (two) times daily.   valACYclovir (VALTREX) 500 MG tablet, Take 1 tab twice daily as needed for 5 days at first signs of symptoms.    Reviewed prior external information including notes and imaging from  primary care provider As well as notes that were available from care everywhere and other healthcare systems.  Past medical history, social, surgical and family history all reviewed in electronic medical record.  No pertanent information unless stated regarding to the chief complaint.   Review of Systems:  No headache, visual changes, nausea, vomiting, diarrhea, constipation, dizziness, abdominal pain, skin rash, fevers, chills, night sweats, weight loss, swollen lymph nodes, body aches, joint swelling, chest pain, shortness of breath, mood changes. POSITIVE muscle aches  Objective  Last menstrual period 12/12/2012.   General: No apparent distress alert and oriented x3 mood and affect normal, dressed appropriately.  HEENT: Pupils equal, extraocular movements intact  Respiratory: Patient's speak in full sentences and does not appear short of breath  Cardiovascular: No lower extremity edema, non tender, no erythema      Impression and Recommendations:

## 2022-10-16 ENCOUNTER — Ambulatory Visit: Payer: BC Managed Care – PPO | Admitting: Family Medicine

## 2022-10-16 NOTE — Progress Notes (Unsigned)
[facility] [address] [place] [address] Phone: [phone] Subjective:   Patricia Lloyd, am serving as a scribe for Dr. Antoine Primas.  I'm seeing this patient by the request  of:  Deeann Saint, MD  CC: foot pain   [id number]  09/04/2022 Underlying fibromyalgia. Patient does have a little significant arthritic changes in multiple other joints. Discussed with patient to continue to monitor and can consider other medications. At the moment patient wants to see if we can improve the back pain more and is awaiting the KUB. Getting labs as well to further evaluate kidney function.   History of the neuroma previously.  Discussed with patient that there is a possibility of this.  Will monitor.  Worsening pain can consider injections but at the moment I think the more concerning aspect is her back pain.  Will get KUB to rule out anything such as a kidney stone that could be contributing.  Has had difficulty with urinary incontinence.  Will try some home exercises otherwise if normal.  If worsening pain advanced imaging would be warranted.     Updated 10/17/2022 Zyara Riling Mayorquin is a 62 y.o. female coming in with complaint of R foot pain. Patient states that she recently had a metatarsal cookie placed on her orthotic and it is causing hip pain and numbness in toes.   Pain in L side persists. Trying to do windmill stretch. Driving causes sharp pain in anterior aspect of B hips but not in the side of the hip and abdomen.   Wants to know if manipulation would be helpful. Has felt nauseous after getting manipulation but would like to try it again. Was told she has a "sway back" as a child and that now it is causing her pain.        Past Medical History:  Diagnosis Date   Fibromyalgia    H/O fatigue    Insomnia    PONV (postoperative nausea and vomiting)    severe   Past Surgical History:  Procedure Laterality Date   CESAREAN SECTION  1995    CESAREAN SECTION  2000   DILATATION & CURETTAGE/HYSTEROSCOPY WITH TRUECLEAR N/A 01/01/2013   Procedure: DILATATION & CURETTAGE/HYSTEROSCOPY WITH TRUCLEAR;  Surgeon: Meriel Pica, MD;  Location: [facility];  Service: Gynecology;  Laterality: N/A;   SHOULDER SURGERY  05/03/2010   Left shoulder surgery, Dr.Doldorf     TMJ ARTHROPLASTY  1990   Social History   Socioeconomic History   Marital status: Single    Spouse name: Not on file   Number of children: 2   Years of education: COLLEGE   Highest education level: Not on file  Occupational History   Occupation: [employer]    Employer: [employer]  Tobacco Use   Smoking status: Former   Smokeless tobacco: Never   Tobacco comments:    Social smoker quit at age 58  Substance and Sexual Activity   Alcohol use: Yes    Alcohol/week: 0.0 standard drinks of alcohol    Comment: 2 GLASSES WINE DAILY    Drug use: No   Sexual activity: Not on file  Other Topics Concern   Not on file  Social History Narrative   Lives at home w/ her fiance    Right-handed   Caffeine: 1 cup of coffee per day   Social Determinants of Health   Financial Resource Strain: Not on file  Food Insecurity: Not on file  Transportation  Needs: Not on file  Physical Activity: Not on file  Stress: Not on file  Social Connections: Not on file   No Known Allergies Family History  Problem Relation Age of Onset   Cancer Mother        Pancreatic    Hypertension Father    Cancer Maternal Aunt     Current Outpatient Medications (Endocrine & Metabolic):    ESTERIFIED ESTROGENS PO, Take by mouth.   estradiol (VIVELLE-DOT) 0.05 MG/24HR patch, Place 1 patch onto the skin 2 (two) times a week.   Progesterone Micronized (PROGESTERONE PO), Take by mouth.  Current Facility-Administered Medications (Endocrine & Metabolic):    methylPREDNISolone acetate (DEPO-MEDROL) injection 80 mg    Current Outpatient Medications (Respiratory):    cetirizine-pseudoephedrine  (ZYRTEC-D) 5-120 MG tablet, One twice daily for congestion (Patient taking differently: 1 tablet daily. One twice daily for congestion)   diphenhydrAMINE (BENADRYL) 25 mg capsule, Take by mouth.   Current Outpatient Medications (Analgesics):    ibuprofen (ADVIL) 200 MG tablet, Take by mouth.   meloxicam (MOBIC) 15 MG tablet, Take 1 tablet (15 mg total) by mouth daily as needed for pain.     Current Outpatient Medications (Other):    B Complex Vitamins (VITAMIN B-COMPLEX) TABS, Take by mouth.   Biotin 1000 MCG tablet, Take by mouth.   cyclobenzaprine (FLEXERIL) 5 MG tablet, Take 1 tablet (5 mg total) by mouth at bedtime as needed for muscle spasms.   DULoxetine (CYMBALTA) 20 MG capsule, Take 1 capsule (20 mg total) by mouth daily.   fluconazole (DIFLUCAN) 150 MG tablet, 1 po stat, repeat x 1 in 48 hours   magnesium gluconate (MAGONATE) 500 MG tablet, Take 500 mg by mouth 2 (two) times daily.   Multiple Vitamin (MULTI VITAMIN DAILY PO),    Multiple Vitamins-Minerals (MULTI COMPLETE PO), Take by mouth.   valACYclovir (VALTREX) 500 MG tablet, Take 1 tab twice daily as needed for 5 days at first signs of symptoms.   ALPRAZolam (XANAX) 0.25 MG tablet, Take by mouth.   amoxicillin (AMOXIL) 875 MG tablet, Take by mouth. (Patient not taking: Reported on 09/18/2022)   metroNIDAZOLE (METROGEL) 0.75 % vaginal gel, Insert 1 applicatorful every day by vaginal route for 5 days.   oseltamivir (TAMIFLU) 75 MG capsule, Take 1 capsule (75 mg total) by mouth 2 (two) times daily.    Reviewed prior external information including notes and imaging from  primary care provider As well as notes that were available from care everywhere and other healthcare systems.  Past medical history, social, surgical and family history all reviewed in electronic medical record.  No pertanent information unless stated regarding to the chief complaint.   Review of Systems:  No headache, visual changes, nausea, vomiting,  diarrhea, constipation, dizziness, abdominal pain, skin rash, fevers, chills, night sweats, weight loss, swollen lymph nodes, body aches, joint swelling, chest pain, shortness of breath, mood changes. POSITIVE muscle aches  Objective  Blood pressure (!) 142/88, pulse 64, height 5\' 4"  (1.626 m), weight 142 lb (64.4 kg), last menstrual period 12/12/2012, SpO2 98%.   General: No apparent distress alert and oriented x3 mood and affect normal, dressed appropriately.  HEENT: Pupils equal, extraocular movements intact  Respiratory: Patient's speak in full sentences and does not appear short of breath  Cardiovascular: No lower extremity edema, non tender, no erythema  Foot exam shows breakdown of the transverse arch noted.  Patient does have tenderness to palpation in the gluteal area right greater than left. Low  back exam does have some loss lordosis noted as well. Some scoliosis noted.  Tenderness to palpation over the right greater trochanteric area.  Limited muscular skeletal ultrasound was performed and interpreted by Antoine Primas, M  Limited ultrasound shows that there is hypoechoic changes noted as well.  Consistent with a neuroma noted today.  Osteopathic findings  T5 extended rotated and side bent right  L1 flexed rotated and side bent right Sacrum right on right    Impression and Recommendations:     The above documentation has been reviewed and is accurate and complete Judi Saa, DO

## 2022-10-17 ENCOUNTER — Encounter: Payer: Self-pay | Admitting: Family Medicine

## 2022-10-17 ENCOUNTER — Other Ambulatory Visit: Payer: Self-pay

## 2022-10-17 ENCOUNTER — Ambulatory Visit: Payer: BC Managed Care – PPO | Admitting: Family Medicine

## 2022-10-17 VITALS — BP 142/88 | HR 64 | Ht 64.0 in | Wt 142.0 lb

## 2022-10-17 DIAGNOSIS — M9903 Segmental and somatic dysfunction of lumbar region: Secondary | ICD-10-CM | POA: Diagnosis not present

## 2022-10-17 DIAGNOSIS — M216X1 Other acquired deformities of right foot: Secondary | ICD-10-CM | POA: Diagnosis not present

## 2022-10-17 DIAGNOSIS — M25552 Pain in left hip: Secondary | ICD-10-CM

## 2022-10-17 DIAGNOSIS — M5416 Radiculopathy, lumbar region: Secondary | ICD-10-CM

## 2022-10-17 DIAGNOSIS — M79671 Pain in right foot: Secondary | ICD-10-CM | POA: Diagnosis not present

## 2022-10-17 DIAGNOSIS — M9904 Segmental and somatic dysfunction of sacral region: Secondary | ICD-10-CM | POA: Diagnosis not present

## 2022-10-17 DIAGNOSIS — M9902 Segmental and somatic dysfunction of thoracic region: Secondary | ICD-10-CM

## 2022-10-17 DIAGNOSIS — G5761 Lesion of plantar nerve, right lower limb: Secondary | ICD-10-CM

## 2022-10-17 MED ORDER — DULOXETINE HCL 20 MG PO CPEP: 20.0000 mg | ORAL_CAPSULE | Freq: Every day | ORAL | 0 refills | Status: DC

## 2022-10-17 NOTE — Assessment & Plan Note (Addendum)
Has had difficulty previously.  Does have known degenerative disc disease.  We can get new x-rays if needed but attempted osteopathic manipulation at this time.  Discussed which activities to do and which ones to avoid.  Attempted osteopathic manipulation as well which I think patient will do very well with.  Follow-up again in 6 to 8 weeks started Cymbalta as well to help with some of the nerve pain.  Think it will be beneficial.

## 2022-10-17 NOTE — Patient Instructions (Addendum)
[facility]  Cymbalta 20mg   Try orthotic but less time  Try OMT today See me again in 6 weeks

## 2022-10-17 NOTE — Assessment & Plan Note (Signed)
Will see how patient responds but if any worsening symptoms will need injection

## 2022-10-17 NOTE — Assessment & Plan Note (Signed)

## 2022-10-17 NOTE — Assessment & Plan Note (Signed)
Continues to have difficulty.  Patient does have a neuroma.  Declined injection.  Will continue with the orthotics and see how patient responds

## 2022-11-07 ENCOUNTER — Telehealth: Payer: Self-pay | Admitting: Cardiology

## 2022-11-07 NOTE — Telephone Encounter (Signed)
Patient has not been seen in our office since 2023. Last  visit with Dr. Antoine Poche in 03/2021. Tried calling to inform patient she needs OV but no answer. LVMTCB

## 2022-11-07 NOTE — Telephone Encounter (Signed)
Spoke with patient and she states that she needs clearance for polyp surgery on 11/06.  She states that Dr. Dennie Bible office should be faxing over the paperwork today.  She wants to know if she can get a call back once we receive the paperwork and if it can be completed before her surgery.  Please give her a call [phone].

## 2022-11-08 ENCOUNTER — Telehealth: Payer: Self-pay

## 2022-11-08 NOTE — Telephone Encounter (Signed)
Pt has been scheduled to see Joni Reining, DNP 11/13/22, this is the soonest could get the pt in for pre op appt. Pt is not on any blood thinners that need to be held. I will update all parties involved.

## 2022-11-08 NOTE — Telephone Encounter (Signed)
   Name: Patricia Lloyd  DOB: [birth year]  MRN: [id number]  Primary Cardiologist: None  Chart reviewed as part of pre-operative protocol coverage. Because of Patricia Lloyd's past medical history and time since last visit, she will require a follow-up in-office visit in order to better assess preoperative cardiovascular risk.  Patient's last appointment was 03/17/2021.  Pre-op covering staff: - Please schedule appointment and call patient to inform them. If patient already had an upcoming appointment within acceptable timeframe, please add "pre-op clearance" to the appointment notes so provider is aware. - Please contact requesting surgeon's office via preferred method (i.e, phone, fax) to inform them of need for appointment prior to surgery.    Napoleon Form, Leodis Rains, NP  11/08/2022, 8:46 AM

## 2022-11-08 NOTE — Telephone Encounter (Signed)
   Pre-operative Risk Assessment    Patient Name: Patricia Lloyd  DOB: [birth year] MRN: [id number]      Request for Surgical Clearance    Procedure:   Hysterectomy D&C, Myosure Lite  Date of Surgery:  Clearance 11/15/22                                 Surgeon:  Richarda Overlie, MD Surgeon's Group or Practice Name:  [facility] Phone number:  [phone] Fax number:  [phone]   Type of Clearance Requested:   - Medical    Type of Anesthesia:   Propofol   Additional requests/questions:    Garrel Ridgel   11/08/2022, 8:34 AM

## 2022-11-12 ENCOUNTER — Other Ambulatory Visit: Payer: Self-pay | Admitting: Family Medicine

## 2022-11-12 NOTE — Progress Notes (Unsigned)
Cardiology Office Note:  .   Date:  11/13/2022  ID:  Patricia Lloyd, DOB 09-14-1960, MRN [id number] PCP: Deeann Saint, MD  [facility] Providers Cardiologist:  Dr. Antoine Poche   History of Present Illness: Marland Kitchen   Patricia Lloyd is a 62 y.o. 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   Echocardiogram 04/15/2021 normal LVEF of 65 to 70% mild LVH no valvular abnormalities.  Last seen by Dr. Antoine Poche on 12/18/2019, at that time he did not feel it necessary to see her regularly from a cardiac standpoint and she was to return as needed.  She comes today for preoperative cardiac evaluation to have hysterectomy, D&C, and MyoSure lite with Dr. Richarda Overlie on 11/15/2022.  She comes today completely without symptoms.  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  She tries to stay active but wants to do more as she is driving so much.  She offers no complaints of chest pain, dyspnea on exertion, palpitations, dizziness, or lack of energy.  Studies Reviewed: .    Echocardiogram 04/15/2021 1. Left ventricular ejection fraction, by estimation, is 65 to 70%. The  left ventricle has normal function. The left ventricle has no regional  wall motion abnormalities. There is mild left ventricular hypertrophy of  the basal-septal segment. Left  ventricular diastolic parameters were normal.   2. Right ventricular systolic function is normal. The right ventricular  size is normal.   3. The mitral valve is normal in structure. Trivial mitral valve  regurgitation. No evidence of mitral stenosis.   4. The aortic valve is tricuspid. There is mild calcification of the  aortic valve. Aortic valve regurgitation is not visualized. Aortic valve  sclerosis/calcification is present, without any evidence  of aortic  stenosis.   5. The inferior vena cava is normal in size with greater than 50%  respiratory variability, suggesting right atrial pressure of 3 mmHg.     EKG Interpretation Date/Time:  Monday November 13 2022 09:14:53 EST Ventricular Rate:  61 PR Interval:  156 QRS Duration:  70 QT Interval:  414 QTC Calculation: 416 R Axis:   -20  Text Interpretation: Normal sinus rhythm When compared with ECG of 28-Apr-2010 09:05, Questionable change in QRS axis Non-specific change in ST segment in Anterior leads T wave inversion now evident in Inferior leads T wave inversion now evident in Anterior leads Confirmed by Joni Reining [phone]) on 11/13/2022 9:37:02 AM    Physical Exam:   VS:  BP 130/80 (BP Location: Left Arm, Patient Position: Sitting, Cuff Size: Normal)   Pulse 61   Ht 5\' 4"  (1.626 m)   Wt 144 lb (65.3 kg)   LMP 12/12/2012   SpO2 96%   BMI 24.72 kg/m    Wt Readings from Last 3 Encounters:  11/13/22 144 lb (65.3 kg)  10/17/22 142 lb (64.4 kg)  10/10/22 142 lb (64.4 kg)    GEN: Well nourished, well developed in no acute distress NECK: No JVD; No carotid bruits CARDIAC:  RRR, no murmurs, rubs, gallops RESPIRATORY:  Clear to auscultation without rales, wheezing or rhonchi  ABDOMEN: Soft, non-tender, non-distended EXTREMITIES:  No edema; No deformity   ASSESSMENT AND PLAN: .   1. Pre-Operative Cardiac Evaluation According to the Revised Cardiac Risk Index (RCRI), her Perioperative Risk of Major Cardiac Event is (%):  0.4  Her Functional Capacity in METs is: 8.97 according to the Duke Activity Status Index (DASI).   Therefore, based on ACC/AHA guidelines, patient would be at acceptable risk for the planned procedure without further cardiovascular testing. I will route this recommendation to the requesting party via Epic fax function.        2. Dyslipidemia: Diet controlled not on any statin medications.  PCP is monitoring labs.  Of note, she does not have  pulmonary hypertension or aortic atherosclerosis.  She had a normal exercise stress test in 2021.  Signed, Bettey Mare. Liborio Nixon, ANP, AACC

## 2022-11-13 ENCOUNTER — Encounter: Payer: Self-pay | Admitting: Adult Health

## 2022-11-13 ENCOUNTER — Ambulatory Visit: Payer: BC Managed Care – PPO | Attending: Adult Health | Admitting: Adult Health

## 2022-11-13 VITALS — BP 130/80 | HR 61 | Ht 64.0 in | Wt 144.0 lb

## 2022-11-13 DIAGNOSIS — Z01818 Encounter for other preprocedural examination: Secondary | ICD-10-CM | POA: Diagnosis not present

## 2022-11-13 DIAGNOSIS — E785 Hyperlipidemia, unspecified: Secondary | ICD-10-CM | POA: Diagnosis not present

## 2022-11-13 NOTE — Patient Instructions (Signed)
Medication Instructions:  No Changes *If you need a refill on your cardiac medications before your next appointment, please call your pharmacy*   Lab Work: No Labs If you have labs (blood work) drawn today and your tests are completely normal, you will receive your results only by: MyChart Message (if you have MyChart) OR A paper copy in the mail If you have any lab test that is abnormal or we need to change your treatment, we will call you to review the results.   Testing/Procedures: No Testing   Follow-Up: At [facility], you and your health needs are our priority.  As part of our continuing mission to provide you with exceptional heart care, we have created designated Provider Care Teams.  These Care Teams include your primary Cardiologist (physician) and Advanced Practice Providers (APPs -  Physician Assistants and Nurse Practitioners) who all work together to provide you with the care you need, when you need it.  We recommend signing up for the patient portal called "MyChart".  Sign up information is provided on this After Visit Summary.  MyChart is used to connect with patients for Virtual Visits (Telemedicine).  Patients are able to view lab/test results, encounter notes, upcoming appointments, etc.  Non-urgent messages can be sent to your provider as well.   To learn more about what you can do with MyChart, go to [email or web address].    Your next appointment:   Keep Scheduled Appointment  Provider:   Rollene Rotunda, MD

## 2022-11-15 DIAGNOSIS — N840 Polyp of corpus uteri: Secondary | ICD-10-CM | POA: Diagnosis not present

## 2022-11-26 NOTE — Therapy (Signed)
OUTPATIENT PHYSICAL THERAPY THORACOLUMBAR EVALUATION   Patient Name: Patricia Lloyd MRN: [id number] DOB:Feb 27, 1960, 62 y.o., female Today's Date: 11/27/2022  END OF SESSION:  PT End of Session - 11/27/22 1142     Visit Number 1    Number of Visits 12    Date for PT Re-Evaluation 01/22/23    Authorization Type [employer]    PT Start Time 1032    PT Stop Time 1105    PT Time Calculation (min) 33 min    Activity Tolerance Patient tolerated treatment well    Behavior During Therapy WFL for tasks assessed/performed             Past Medical History:  Diagnosis Date   Fibromyalgia    H/O fatigue    Insomnia    PONV (postoperative nausea and vomiting)    severe   Past Surgical History:  Procedure Laterality Date   CESAREAN SECTION  1995   CESAREAN SECTION  2000   DILATATION & CURETTAGE/HYSTEROSCOPY WITH TRUECLEAR N/A 01/01/2013   Procedure: DILATATION & CURETTAGE/HYSTEROSCOPY WITH TRUCLEAR;  Surgeon: Meriel Pica, MD;  Location: [facility];  Service: Gynecology;  Laterality: N/A;   SHOULDER SURGERY  05/03/2010   Left shoulder surgery, Dr.Doldorf     TMJ ARTHROPLASTY  1990   Patient Active Problem List   Diagnosis Date Noted   Somatic dysfunction of spine, sacral 10/17/2022   Cervical disc disorder with radiculopathy of cervical region 04/27/2021   Lumbar radiculopathy 04/27/2021   Dyslipidemia 03/02/2021   Fracture of one rib of left side with delayed healing 02/19/2020   Lateral pain of left hip 07/15/2019   Precordial chest pain 01/31/2019   Palpitation 01/30/2019   Educated about COVID-19 virus infection 01/30/2019   Urinary incontinence 01/20/2019   Loss of transverse plantar arch of right foot 07/02/2018   Peroneal tendinitis, right 07/02/2018   Morton neuroma, right 07/02/2018   Sinusitis, acute 04/21/2015   Cervical radiculitis 09/03/2014   Left arm pain 09/02/2014   Strain of right tibialis anterior muscle 06/24/2014   Tendonitis of left rotator cuff  06/24/2014   Fibromyalgia 06/10/2014   Varicose veins of bilateral lower extremities with other complications 01/28/2013   Leg edema, right 01/28/2013   Dizziness and giddiness 12/25/2012    PCP:   Enid Baas, MD    REFERRING PROVIDER: Judi Saa, DO /   Enid Baas, MD    REFERRING DIAG:  Scoliosis of lumbar spine, unspecified scoliosis type  M51.369 (ICD-10-CM) - Degeneration of intervertebral disc of lumbar region without discogenic back pain or lower extremity pain    Lumbar radiculopathy  M25.552 (ICD-10-CM) - Lateral pain of left hip    Rationale for Evaluation and Treatment: Rehabilitation  THERAPY DIAG:  Other low back pain  Muscle weakness (generalized)  Abnormal posture  ONSET DATE: increasing over then last year  SUBJECTIVE:  SUBJECTIVE STATEMENT: Chronic pain makes me tired.  Going for a couple of years. Exacerbated in last year with increase in driving time for work.  Work > 40 hour week.  Don't do any kind of organized exercise. Pain is constant.  PERTINENT HISTORY:  -Foot pain on the right and symptoms consistent with a -Morton's neuroma  -bilateral hip pain that hurts in her groin/ MRIs which showed some labral pathology in the left hip and some mild arthritis of both hip joints -significant scoliosis with a convex curve    PAIN:  Are you having pain? Yes: NPRS scale: Current 5/10 worse 8/10 Pain location: Left low thoracic and lumbar area to left into hip Pain description: constant in left lumbar spine and hip Aggravating factors: walking; standing, pilates  Relieving factors: nothing  PRECAUTIONS: None  RED FLAGS: None   WEIGHT BEARING RESTRICTIONS: No  FALLS:  Has patient fallen in last 6 months? No  LIVING ENVIRONMENT: Lives with: lives with  their family Lives in: House/apartment Stairs: Yes: Internal: 15 steps; on right going up Has following equipment at home: None  OCCUPATION: Control and instrumentation engineer store  PLOF: Independent  PATIENT GOALS: decrease pain  NEXT MD VISIT: as needed  OBJECTIVE:  Note: Objective measures were completed at Evaluation unless otherwise noted.  DIAGNOSTIC FINDINGS:  Mild levoscoliosis of lumbar spine is noted. Minimal grade 1  retrolisthesis of L1-2 is noted secondary to severe degenerative  disc disease at this level. Mild grade 1 anterolisthesis of L4-5 is  noted secondary to posterior facet joint hypertrophy. Moderate  degenerative disc disease is noted at L5-S1.   PATIENT SURVEYS:  FOTO Primary score: 68% risk adjusted 53 goal 70    COGNITION: Overall cognitive status: Within functional limits for tasks assessed     SENSATION: WFL  MUSCLE LENGTH: Hamstrings: sitting wfl   POSTURE:  left higher than right Pt wearing right shoe orthotic due to pronation  PALPATION: Slight TTP bilat hip about bursa area  LUMBAR ROM:  WFL  LOWER EXTREMITY STRENGTH     MMT Right eval Left eval  Hip flexion 48.1 45.0  Hip extension    Hip abduction 24.8 25.5  Hip adduction    Hip internal rotation    Hip external rotation    Knee flexion    Knee extension 45.7 46.6  Ankle dorsiflexion    Ankle plantarflexion    Ankle inversion    Ankle eversion     (Blank rows = not tested)  LOWER EXTREMITY MMT:    MMT Right eval Left eval  Hip flexion    Hip extension    Hip abduction    Hip adduction    Hip internal rotation    Hip external rotation    Knee flexion    Knee extension    Ankle dorsiflexion    Ankle plantarflexion    Ankle inversion    Ankle eversion     (Blank rows = not tested)    FUNCTIONAL TESTS:  5 times sit to stand: 13.72 Timed up and go (TUG): 9.77   4 stage balance: passed GAIT: Distance walked: 500 ft Assistive device utilized: None Level of  assistance: Complete Independence Comments: wfl  TODAY'S TREATMENT:  Eval   PATIENT EDUCATION:  Education details: Discussed eval findings, rehab rationale, aquatic program progression/POC and pools in area. Patient is in agreement  Person educated: Patient Education method: Explanation Education comprehension: verbalized understanding  HOME EXERCISE PROGRAM: TBA  ASSESSMENT:  CLINICAL IMPRESSION: Patient is a 62 y.o. f who was seen today for physical therapy evaluation and treatment for LB and hip pain.  She complains of Right knee and left hip pain, and uses an orthotic to correct supination of right foot which appears consistent with dx of levoscoliosis of lumbar spine. 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 She is active in Pilates but reports her pain is increased afterward.  Pain is constant ,worse in left axial (low thoracic and lumbar area)and  L hip. 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  Goal is to decrease pain with level of activity she prefers.  OBJECTIVE IMPAIRMENTS: decreased knowledge of condition, decreased strength, and pain.    PARTICIPATION LIMITATIONS: driving    REHAB POTENTIAL: Good  CLINICAL DECISION MAKING: Evolving/moderate complexity  EVALUATION COMPLEXITY: Moderate   GOALS: Goals reviewed with patient? Yes  SHORT TERM GOALS: Target date: 12/27/22  Pt will tolerate full aquatic sessions consistently without increase in pain and with improving function to demonstrate good toleration and effectiveness of intervention.  Baseline: Goal status: INITIAL  2.  Pt will be indep with aquatic HEP and have decided upon value of intervention to gain pool access. Baseline:  Goal status: INITIAL  3.  Pt will report decrease of max pain to </= to 5/10 for improved daily  function Baseline: 5/10 Goal status: INITIAL    LONG TERM GOALS: Target date: 01/21/22  Pt to meet stated Foto Goal 70 Baseline: 53 Goal status: INITIAL  2.  Pt will be indep with final HEP's (land and aquatic as appropriate) for continued management of condition Baseline:  Goal status: INITIAL  3.  Pt will improve hip abd strength  by 10 lbs to demonstrate improved overall physical function Baseline: See chart  Goal status: INITIAL  4.  Pt will report a decrease in normal pain to </=3/10 for improved functional ability/toleration Baseline: 5/10 Goal status: INITIAL  5.  Pt will report return to daily (weekly) exercise regimen for improved overall health and well being  Baseline:  Goal status: INITIAL    PLAN:  PT FREQUENCY: 1-2x/week  PT DURATION: 8 weeks  PLANNED INTERVENTIONS: 97164- PT Re-evaluation, 97110-Therapeutic exercises, 97530- Therapeutic activity, 97112- Neuromuscular re-education, 97535- Self Care, [address]- Manual therapy, [phone]- Gait training, [phone]- Orthotic Fit/training, [phone]- Aquatic Therapy, [phone]- Electrical stimulation (unattended), Patient/Family education, Balance training, Stair training, Taping, Dry Needling, Joint mobilization, Spinal manipulation, DME instructions, Cryotherapy, and Moist heat.  PLAN FOR NEXT SESSION: Land and aquatic: core and le strengthening, Pt instruction/edu on position and posture, management of condition.  HEP   Geni Bers, PT 11/27/2022, 11:43 AM

## 2022-11-27 ENCOUNTER — Ambulatory Visit (HOSPITAL_BASED_OUTPATIENT_CLINIC_OR_DEPARTMENT_OTHER): Payer: BC Managed Care – PPO | Attending: Sports Medicine | Admitting: Physical Therapy

## 2022-11-27 ENCOUNTER — Other Ambulatory Visit: Payer: Self-pay

## 2022-11-27 ENCOUNTER — Encounter (HOSPITAL_BASED_OUTPATIENT_CLINIC_OR_DEPARTMENT_OTHER): Payer: Self-pay | Admitting: Physical Therapy

## 2022-11-27 DIAGNOSIS — M5459 Other low back pain: Secondary | ICD-10-CM | POA: Diagnosis not present

## 2022-11-27 DIAGNOSIS — M419 Scoliosis, unspecified: Secondary | ICD-10-CM | POA: Diagnosis not present

## 2022-11-27 DIAGNOSIS — M25552 Pain in left hip: Secondary | ICD-10-CM | POA: Insufficient documentation

## 2022-11-27 DIAGNOSIS — R293 Abnormal posture: Secondary | ICD-10-CM | POA: Diagnosis not present

## 2022-11-27 DIAGNOSIS — M6281 Muscle weakness (generalized): Secondary | ICD-10-CM | POA: Diagnosis not present

## 2022-11-27 DIAGNOSIS — M51369 Other intervertebral disc degeneration, lumbar region without mention of lumbar back pain or lower extremity pain: Secondary | ICD-10-CM | POA: Insufficient documentation

## 2022-11-27 NOTE — Progress Notes (Unsigned)
[facility] [address] [place] [address] Phone: [phone] Subjective:   Bruce Donath, am serving as a scribe for Dr. Antoine Primas.  I'm seeing this patient by the request  of:  Deeann Saint, MD  CC: Low back pain and foot pain  [id number]  Aliyna Vietti Nest is a 62 y.o. female coming in with complaint of back and neck pain. OMT on 10/17/2022. No change in pain since last visit. Also seen for R foot pain. Patient states she started aquatic PT yesterday. Is also going to do a few land sessions.   Tries to wear orthotics in as many shoes as she can. Does have metatarsal pads in all shoes. Has numbness in toes intermittently.   Cymbalta made patient tired and lethargic. Also had fogginess.   Medications patient has been prescribed: Cymbalta  Taking:         Reviewed prior external information including notes and imaging from previsou exam, outside providers and external EMR if available.   As well as notes that were available from care everywhere and other healthcare systems.  Past medical history, social, surgical and family history all reviewed in electronic medical record.  No pertanent information unless stated regarding to the chief complaint.   Past Medical History:  Diagnosis Date   Fibromyalgia    H/O fatigue    Insomnia    PONV (postoperative nausea and vomiting)    severe    No Known Allergies   Review of Systems:  No headache, visual changes, nausea, vomiting, diarrhea, constipation, dizziness, abdominal pain, skin rash, fevers, chills, night sweats, weight loss, swollen lymph nodes, body aches, joint swelling, chest pain, shortness of breath, mood changes. POSITIVE muscle aches  Objective  Blood pressure 120/88, pulse 76, height 5\' 4"  (1.626 m), last menstrual period 12/12/2012, SpO2 98%.   General: No apparent distress alert and oriented x3 mood and affect normal, dressed appropriately.  HEENT: Pupils  equal, extraocular movements intact  Respiratory: Patient's speak in full sentences and does not appear short of breath  Cardiovascular: No lower extremity edema, non tender, no erythema  Gait MSK:  Back does have some significant scoliosis noted.  Loss of lordosis in multiple different areas.  Patient does have tightness with paraspinal musculature.  Relatively though good hip abductor strength noted today.  Osteopathic findings  C2 flexed rotated and side bent right C4 flexed rotated and side bent left T4 extended rotated and side bent right inhaled rib T11 extended rotated and side bent left L2 flexed rotated and side bent right Sacrum right on right       Assessment and Plan:  Lumbar radiculopathy Degenerative scoliosis still noted.  Still has some radicular symptoms we will need to continue to monitor.  Discussed icing regimen and home exercises, discussed which activities to do and which ones to avoid.  Increase activity slowly otherwise.  Follow-up again in 6 to 8 weeks.    Nonallopathic problems  Decision today to treat with OMT was based on Physical Exam  After verbal consent patient was treated with HVLA, ME, FPR techniques in cervical, rib, thoracic, lumbar, and sacral  areas  Patient tolerated the procedure well with improvement in symptoms  Patient given exercises, stretches and lifestyle modifications  See medications in patient instructions if given  Patient will follow up in 4-8 weeks    The above documentation has been reviewed and is accurate and complete Judi Saa, DO  Note: This dictation was prepared with Dragon dictation along with smaller phrase technology. Any transcriptional errors that result from this process are unintentional.

## 2022-11-28 ENCOUNTER — Ambulatory Visit: Payer: BC Managed Care – PPO | Admitting: Family Medicine

## 2022-11-28 VITALS — BP 120/88 | HR 76 | Ht 64.0 in

## 2022-11-28 DIAGNOSIS — M9903 Segmental and somatic dysfunction of lumbar region: Secondary | ICD-10-CM

## 2022-11-28 DIAGNOSIS — M9902 Segmental and somatic dysfunction of thoracic region: Secondary | ICD-10-CM | POA: Diagnosis not present

## 2022-11-28 DIAGNOSIS — M9904 Segmental and somatic dysfunction of sacral region: Secondary | ICD-10-CM

## 2022-11-28 DIAGNOSIS — M9901 Segmental and somatic dysfunction of cervical region: Secondary | ICD-10-CM | POA: Diagnosis not present

## 2022-11-28 DIAGNOSIS — M5416 Radiculopathy, lumbar region: Secondary | ICD-10-CM

## 2022-11-28 DIAGNOSIS — M9908 Segmental and somatic dysfunction of rib cage: Secondary | ICD-10-CM | POA: Diagnosis not present

## 2022-11-29 ENCOUNTER — Encounter: Payer: Self-pay | Admitting: Family Medicine

## 2022-11-29 NOTE — Assessment & Plan Note (Signed)
Degenerative scoliosis still noted.  Still has some radicular symptoms we will need to continue to monitor.  Discussed icing regimen and home exercises, discussed which activities to do and which ones to avoid.  Increase activity slowly otherwise.  Follow-up again in 6 to 8 weeks.

## 2022-12-01 ENCOUNTER — Encounter (HOSPITAL_BASED_OUTPATIENT_CLINIC_OR_DEPARTMENT_OTHER): Payer: Self-pay | Admitting: Physical Therapy

## 2022-12-01 ENCOUNTER — Ambulatory Visit (HOSPITAL_BASED_OUTPATIENT_CLINIC_OR_DEPARTMENT_OTHER): Payer: BC Managed Care – PPO | Admitting: Physical Therapy

## 2022-12-01 DIAGNOSIS — M25552 Pain in left hip: Secondary | ICD-10-CM | POA: Diagnosis not present

## 2022-12-01 DIAGNOSIS — M5459 Other low back pain: Secondary | ICD-10-CM | POA: Diagnosis not present

## 2022-12-01 DIAGNOSIS — R293 Abnormal posture: Secondary | ICD-10-CM

## 2022-12-01 DIAGNOSIS — M6281 Muscle weakness (generalized): Secondary | ICD-10-CM

## 2022-12-01 DIAGNOSIS — M419 Scoliosis, unspecified: Secondary | ICD-10-CM | POA: Diagnosis not present

## 2022-12-01 DIAGNOSIS — M51369 Other intervertebral disc degeneration, lumbar region without mention of lumbar back pain or lower extremity pain: Secondary | ICD-10-CM | POA: Diagnosis not present

## 2022-12-01 NOTE — Therapy (Signed)
OUTPATIENT PHYSICAL THERAPY THORACOLUMBAR TREATMENT   Patient Name: Patricia Lloyd MRN: [id number] DOB:10/14/1960, 62 y.o., female Today's Date: 12/01/2022  END OF SESSION:  PT End of Session - 12/01/22 1033     Visit Number 2    Number of Visits 12    Date for PT Re-Evaluation 01/22/23    Authorization Type BCBS    PT Start Time 1031    PT Stop Time 1110    PT Time Calculation (min) 39 min    Behavior During Therapy WFL for tasks assessed/performed             Past Medical History:  Diagnosis Date   Fibromyalgia    H/O fatigue    Insomnia    PONV (postoperative nausea and vomiting)    severe   Past Surgical History:  Procedure Laterality Date   CESAREAN SECTION  1995   CESAREAN SECTION  2000   DILATATION & CURETTAGE/HYSTEROSCOPY WITH TRUECLEAR N/A 01/01/2013   Procedure: DILATATION & CURETTAGE/HYSTEROSCOPY WITH TRUCLEAR;  Surgeon: Meriel Pica, MD;  Location: [facility];  Service: Gynecology;  Laterality: N/A;   SHOULDER SURGERY  05/03/2010   Left shoulder surgery, Dr.Doldorf     TMJ ARTHROPLASTY  1990   Patient Active Problem List   Diagnosis Date Noted   Somatic dysfunction of spine, sacral 10/17/2022   Cervical disc disorder with radiculopathy of cervical region 04/27/2021   Lumbar radiculopathy 04/27/2021   Dyslipidemia 03/02/2021   Fracture of one rib of left side with delayed healing 02/19/2020   Lateral pain of left hip 07/15/2019   Precordial chest pain 01/31/2019   Palpitation 01/30/2019   Educated about COVID-19 virus infection 01/30/2019   Urinary incontinence 01/20/2019   Loss of transverse plantar arch of right foot 07/02/2018   Peroneal tendinitis, right 07/02/2018   Morton neuroma, right 07/02/2018   Sinusitis, acute 04/21/2015   Cervical radiculitis 09/03/2014   Left arm pain 09/02/2014   Strain of right tibialis anterior muscle 06/24/2014   Tendonitis of left rotator cuff 06/24/2014   Fibromyalgia 06/10/2014   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 complications 01/28/2013   Leg edema, right 01/28/2013   Dizziness and giddiness 12/25/2012    PCP:   Enid Baas, MD    REFERRING PROVIDER: Judi Saa, DO /   Enid Baas, MD    REFERRING DIAG:  Scoliosis of lumbar spine, unspecified scoliosis type  M51.369 (ICD-10-CM) - Degeneration of intervertebral disc of lumbar region without discogenic back pain or lower extremity pain    Lumbar radiculopathy  M25.552 (ICD-10-CM) - Lateral pain of left hip    Rationale for Evaluation and Treatment: Rehabilitation  THERAPY DIAG:  Other low back pain  Muscle weakness (generalized)  Abnormal posture  ONSET DATE: increasing over then last year  SUBJECTIVE:  SUBJECTIVE STATEMENT: Pt walks 3-4 miles 2x / wk and pilates 2x/ wk.  She takes meloxicam at night for pain.   No new changes since last visit.  She plans to possibly join [employer] and attend with a friend.   PERTINENT HISTORY:  -Foot pain on the right and symptoms consistent with a -Morton's neuroma  -bilateral hip pain that hurts in her groin/ MRIs which showed some labral pathology in the left hip and some mild arthritis of both hip joints -significant scoliosis with a convex curve    PAIN:  Are you having pain? Yes: NPRS scale: Current 4/10  Pain location: Left low thoracic and lumbar area to left into hip Pain description: constant in left lumbar spine and hip Aggravating factors: walking; standing, pilates  Relieving factors: nothing  PRECAUTIONS: None  RED FLAGS: None   WEIGHT BEARING RESTRICTIONS: No  FALLS:  Has patient fallen in last 6 months? No  LIVING ENVIRONMENT: Lives with: lives with their family Lives in: House/apartment Stairs: Yes: Internal: 15 steps; on right going  up Has following equipment at home: None  OCCUPATION: Control and instrumentation engineer store  PLOF: Independent  PATIENT GOALS: decrease pain  NEXT MD VISIT: as needed  OBJECTIVE:  Note: Objective measures were completed at Evaluation unless otherwise noted.  DIAGNOSTIC FINDINGS:  Mild levoscoliosis of lumbar spine is noted. Minimal grade 1  retrolisthesis of L1-2 is noted secondary to severe degenerative  disc disease at this level. Mild grade 1 anterolisthesis of L4-5 is  noted secondary to posterior facet joint hypertrophy. Moderate  degenerative disc disease is noted at L5-S1.   PATIENT SURVEYS:  FOTO Primary score: 68% risk adjusted 53 goal 70    COGNITION: Overall cognitive status: Within functional limits for tasks assessed     SENSATION: WFL  MUSCLE LENGTH: Hamstrings: sitting wfl   POSTURE:  left higher than right Pt wearing right shoe orthotic due to pronation  PALPATION: Slight TTP bilat hip about bursa area  LUMBAR ROM:  WFL  LOWER EXTREMITY STRENGTH     MMT Right eval Left eval  Hip flexion 48.1 45.0  Hip extension    Hip abduction 24.8 25.5  Hip adduction    Hip internal rotation    Hip external rotation    Knee flexion    Knee extension 45.7 46.6  Ankle dorsiflexion    Ankle plantarflexion    Ankle inversion    Ankle eversion     (Blank rows = not tested)  LOWER EXTREMITY MMT:    MMT Right eval Left eval  Hip flexion    Hip extension    Hip abduction    Hip adduction    Hip internal rotation    Hip external rotation    Knee flexion    Knee extension    Ankle dorsiflexion    Ankle plantarflexion    Ankle inversion    Ankle eversion     (Blank rows = not tested)    FUNCTIONAL TESTS:  5 times sit to stand: 13.72 Timed up and go (TUG): 9.77   4 stage balance: passed GAIT: Distance walked: 500 ft Assistive device utilized: None Level of assistance: Complete Independence Comments: wfl  TODAY'S TREATMENT:  Pt seen for aquatic therapy today.  Treatment took place in water 3.5-4.75 ft in depth at the [facility] pool. Temp of water was 91.  Pt entered/exited the pool via stairs independently with bilat rail. * intro to aquatic therapy principles * walking forward/ backward multiple laps with cues for reciprocal arm swing * side stepping x 3 laps-> with rainbow then yellow hand floats and arm addct/ abdct x 3 lap * farmer carry with single/ bilat rainbow hand float under water at side (more difficult on L side) * UE on yellow hand floats:  leg swings into hip flex/ ext x 10 each; into hip abdct/ addct x 10 each  * straddling noodle: cycling, cross country ski, reverse jumping jacks * seated on noodle like swing:  seated balance then with single arm raise * TrA set with hollow -> solid noodle pull down to thighs with controlled return to surface x 10 * 3 way LE stretch with solid noodle with back against wall   Pt requires the buoyancy and hydrostatic pressure of water for support, and to offload joints by unweighting joint load by at least 50 % in navel deep water and by at least 75-80% in chest to neck deep water.  Viscosity of the water is needed for resistance of strengthening. Water current perturbations provides challenge to standing balance requiring increased core activation.     PATIENT EDUCATION:  Education details: Intro to aquatic therapy  Person educated: Patient Education method: Explanation Education comprehension: verbalized understanding  HOME EXERCISE PROGRAM: TBA  ASSESSMENT:  CLINICAL IMPRESSION: Pt is confident in aquatic environment (knows how swim).  She reported reduction of tightness in back /hip at end of session.  Will begin to explore exercises to put into aquatic HEP.  Goals are ongoing.   From initial evaluaiton: Patient is a 62 y.o. f who was seen  today for physical therapy evaluation and treatment for LB and hip pain.  She complains of Right knee and left hip pain, and uses an orthotic to correct supination of right foot which appears consistent with dx of levoscoliosis of lumbar spine. 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 She is active in Pilates but reports her pain is increased afterward.  Pain is constant ,worse in left axial (low thoracic and lumbar area)and  L hip. 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  Goal is to decrease pain with level of activity she prefers.  OBJECTIVE IMPAIRMENTS: decreased knowledge of condition, decreased strength, and pain.    PARTICIPATION LIMITATIONS: driving    REHAB POTENTIAL: Good  CLINICAL DECISION MAKING: Evolving/moderate complexity  EVALUATION COMPLEXITY: Moderate   GOALS: Goals reviewed with patient? Yes  SHORT TERM GOALS: Target date: 12/27/22  Pt will tolerate full aquatic sessions consistently without increase in pain and with improving function to demonstrate good toleration and effectiveness of intervention.  Baseline: Goal status: INITIAL  2.  Pt will be indep with aquatic HEP and have decided upon value of intervention to gain pool access. Baseline:  Goal status: INITIAL  3.  Pt will report decrease of max pain to </= to 5/10 for improved daily function Baseline: 5/10 Goal status: INITIAL    LONG TERM GOALS: Target date: 01/21/22  Pt to meet stated Foto Goal 70 Baseline: 53 Goal status: INITIAL  2.  Pt will be indep with final HEP's (land and aquatic as appropriate) for continued management of condition Baseline:  Goal  status: INITIAL  3.  Pt will improve hip abd strength  by 10 lbs to demonstrate improved overall physical function Baseline: See chart  Goal status: INITIAL  4.  Pt will report a decrease in normal pain to </=3/10 for improved functional ability/toleration Baseline: 5/10 Goal status:  INITIAL  5.  Pt will report return to daily (weekly) exercise regimen for improved overall health and well being  Baseline:  Goal status: INITIAL    PLAN:  PT FREQUENCY: 1-2x/week  PT DURATION: 8 weeks  PLANNED INTERVENTIONS: 97164- PT Re-evaluation, 97110-Therapeutic exercises, 97530- Therapeutic activity, 97112- Neuromuscular re-education, 97535- Self Care, 19147- Manual therapy, [phone]- Gait training, [phone]- Orthotic Fit/training, [phone]- Aquatic Therapy, [phone]- Electrical stimulation (unattended), Patient/Family education, Balance training, Stair training, Taping, Dry Needling, Joint mobilization, Spinal manipulation, DME instructions, Cryotherapy, and Moist heat.  PLAN FOR NEXT SESSION: Land and aquatic: core and le strengthening, Pt instruction/edu on position and posture, management of condition.  HEP  Mayer Camel, [place] 12/01/22 1:14 PM [facility] [address] [place], [place], [address] Phone: [phone]   Fax:  [phone]

## 2022-12-11 ENCOUNTER — Encounter (HOSPITAL_BASED_OUTPATIENT_CLINIC_OR_DEPARTMENT_OTHER): Payer: BC Managed Care – PPO | Admitting: Physical Therapy

## 2022-12-14 ENCOUNTER — Encounter (HOSPITAL_BASED_OUTPATIENT_CLINIC_OR_DEPARTMENT_OTHER): Payer: Self-pay | Admitting: Physical Therapy

## 2022-12-14 ENCOUNTER — Ambulatory Visit (HOSPITAL_BASED_OUTPATIENT_CLINIC_OR_DEPARTMENT_OTHER): Payer: BC Managed Care – PPO | Attending: Sports Medicine | Admitting: Physical Therapy

## 2022-12-14 DIAGNOSIS — M5459 Other low back pain: Secondary | ICD-10-CM | POA: Insufficient documentation

## 2022-12-14 DIAGNOSIS — R293 Abnormal posture: Secondary | ICD-10-CM | POA: Insufficient documentation

## 2022-12-14 DIAGNOSIS — M6281 Muscle weakness (generalized): Secondary | ICD-10-CM | POA: Insufficient documentation

## 2022-12-14 DIAGNOSIS — I7789 Other specified disorders of arteries and arterioles: Secondary | ICD-10-CM | POA: Insufficient documentation

## 2022-12-14 NOTE — Therapy (Signed)
OUTPATIENT PHYSICAL THERAPY THORACOLUMBAR TREATMENT   Patient Name: Patricia Lloyd MRN: [id number] DOB:[birth year], 62 y.o., female Today's Date: 12/14/2022  END OF SESSION:  PT End of Session - 12/14/22 1714     Visit Number 3    Number of Visits 12    Date for PT Re-Evaluation 01/22/23    Authorization Type BCBS    PT Start Time 1617    PT Stop Time 1700    PT Time Calculation (min) 43 min    Activity Tolerance Patient tolerated treatment well    Behavior During Therapy WFL for tasks assessed/performed              Past Medical History:  Diagnosis Date   Fibromyalgia    H/O fatigue    Insomnia    PONV (postoperative nausea and vomiting)    severe   Past Surgical History:  Procedure Laterality Date   CESAREAN SECTION  1995   CESAREAN SECTION  2000   DILATATION & CURETTAGE/HYSTEROSCOPY WITH TRUECLEAR N/A 01/01/2013   Procedure: DILATATION & CURETTAGE/HYSTEROSCOPY WITH TRUCLEAR;  Surgeon: Meriel Pica, MD;  Location: [facility];  Service: Gynecology;  Laterality: N/A;   SHOULDER SURGERY  05/03/2010   Left shoulder surgery, Dr.Doldorf     TMJ ARTHROPLASTY  1990   Patient Active Problem List   Diagnosis Date Noted   Somatic dysfunction of spine, sacral 10/17/2022   Cervical disc disorder with radiculopathy of cervical region 04/27/2021   Lumbar radiculopathy 04/27/2021   Dyslipidemia 03/02/2021   Fracture of one rib of left side with delayed healing 02/19/2020   Lateral pain of left hip 07/15/2019   Precordial chest pain 01/31/2019   Palpitation 01/30/2019   Educated about COVID-19 virus infection 01/30/2019   Urinary incontinence 01/20/2019   Loss of transverse plantar arch of right foot 07/02/2018   Peroneal tendinitis, right 07/02/2018   Morton neuroma, right 07/02/2018   Sinusitis, acute 04/21/2015   Cervical radiculitis 09/03/2014   Left arm pain 09/02/2014   Strain of right tibialis anterior muscle 06/24/2014   Tendonitis of left rotator cuff  06/24/2014   Fibromyalgia 06/10/2014   Varicose veins of bilateral lower extremities with other complications 01/28/2013   Leg edema, right 01/28/2013   Dizziness and giddiness 12/25/2012    PCP:   Enid Baas, MD    REFERRING PROVIDER: Judi Saa, DO /   Enid Baas, MD    REFERRING DIAG:  Scoliosis of lumbar spine, unspecified scoliosis type  M51.369 (ICD-10-CM) - Degeneration of intervertebral disc of lumbar region without discogenic back pain or lower extremity pain    Lumbar radiculopathy  M25.552 (ICD-10-CM) - Lateral pain of left hip    Rationale for Evaluation and Treatment: Rehabilitation  THERAPY DIAG:  Other low back pain  Muscle weakness (generalized)  Abnormal posture  ONSET DATE: increasing over then last year  SUBJECTIVE:  SUBJECTIVE STATEMENT: Pt reports driving a lot increasing hip pain.  PERTINENT HISTORY:  -Foot pain on the right and symptoms consistent with a -Morton's neuroma  -bilateral hip pain that hurts in her groin/ MRIs which showed some labral pathology in the left hip and some mild arthritis of both hip joints -significant scoliosis with a convex curve    PAIN:  Are you having pain? Yes: NPRS scale: Current 7/10  Pain location: Left low thoracic and lumbar area to left into hip Pain description: constant in left lumbar spine and hip Aggravating factors: walking; standing, pilates  Relieving factors: nothing  PRECAUTIONS: None  RED FLAGS: None   WEIGHT BEARING RESTRICTIONS: No  FALLS:  Has patient fallen in last 6 months? No  LIVING ENVIRONMENT: Lives with: lives with their family Lives in: House/apartment Stairs: Yes: Internal: 15 steps; on right going up Has following equipment at home: None  OCCUPATION: Control and instrumentation engineer  store  PLOF: Independent  PATIENT GOALS: decrease pain  NEXT MD VISIT: as needed  OBJECTIVE:  Note: Objective measures were completed at Evaluation unless otherwise noted.  DIAGNOSTIC FINDINGS:  Mild levoscoliosis of lumbar spine is noted. Minimal grade 1  retrolisthesis of L1-2 is noted secondary to severe degenerative  disc disease at this level. Mild grade 1 anterolisthesis of L4-5 is  noted secondary to posterior facet joint hypertrophy. Moderate  degenerative disc disease is noted at L5-S1.   PATIENT SURVEYS:  FOTO Primary score: 68% risk adjusted 53 goal 70    COGNITION: Overall cognitive status: Within functional limits for tasks assessed     SENSATION: WFL  MUSCLE LENGTH: Hamstrings: sitting wfl   POSTURE:  left higher than right Pt wearing right shoe orthotic due to pronation  PALPATION: Slight TTP bilat hip about bursa area  LUMBAR ROM:  WFL  LOWER EXTREMITY STRENGTH     MMT Right eval Left eval  Hip flexion 48.1 45.0  Hip extension    Hip abduction 24.8 25.5  Hip adduction    Hip internal rotation    Hip external rotation    Knee flexion    Knee extension 45.7 46.6  Ankle dorsiflexion    Ankle plantarflexion    Ankle inversion    Ankle eversion     (Blank rows = not tested)  LOWER EXTREMITY MMT:    MMT Right eval Left eval  Hip flexion    Hip extension    Hip abduction    Hip adduction    Hip internal rotation    Hip external rotation    Knee flexion    Knee extension    Ankle dorsiflexion    Ankle plantarflexion    Ankle inversion    Ankle eversion     (Blank rows = not tested)    FUNCTIONAL TESTS:  5 times sit to stand: 13.72 Timed up and go (TUG): 9.77   4 stage balance: passed GAIT: Distance walked: 500 ft Assistive device utilized: None Level of assistance: Complete Independence Comments: wfl  TODAY'S TREATMENT:  Pt seen for aquatic therapy today.  Treatment took place in water 3.5-4.75 ft in depth at the [facility] pool. Temp of water was 91.  Pt entered/exited the pool via stairs independently with bilat rail.  * walking forward/ backward multiple laps with cues for reciprocal arm swing * side stepping x 3 laps-> with rainbow then yellow hand floats and arm addct/ abdct x 3 lap  - side lunges x 3 width * farmer carry with single/ bilat yellow rainbow hand float under water at side * TrA set with hollow -> solid noodle pull down to thighs with controlled return to surface x 10 chest deep wide stance then staggered *oblique set R/L x 5 x 10 sec hold using x 10 solid noodle *standing tradition al squats ue support noodle x 10 *hip hinge TC, verbal cues and demonstration x 8 * seated on noodle like swing:  seated balance then with single arm raise; double arm raise, hands on lap; suspension above noodle *introduced reverse plank. Pt able to gain position but unable to hold * straddling noodle: cycling, cross country ski, reverse jumping jacks  Pt requires the buoyancy and hydrostatic pressure of water for support, and to offload joints by unweighting joint load by at least 50 % in navel deep water and by at least 75-80% in chest to neck deep water.  Viscosity of the water is needed for resistance of strengthening. Water current perturbations provides challenge to standing balance requiring increased core activation.     PATIENT EDUCATION:  Education details: Intro to aquatic therapy  Person educated: Patient Education method: Explanation Education comprehension: verbalized understanding  HOME EXERCISE PROGRAM: TBA  ASSESSMENT:  CLINICAL IMPRESSION: Pt reports good response from last session.  "I just need to do more of it". Added higher resistance with increasing foam weight as well as speed with movement.  Progressed into plates plank as pt is familiar with  Pilates completing a class weekly. No complaints of hip discomfort submerged but does have minor mid thoracic tightness.  She tolerates progression well.  Goals ongoing  From initial evaluaiton: Patient is a 62 y.o. f who was seen today for physical therapy evaluation and treatment for LB and hip pain.  She complains of Right knee and left hip pain, and uses an orthotic to correct supination of right foot which appears consistent with dx of levoscoliosis of lumbar spine. 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 She is active in Pilates but reports her pain is increased afterward.  Pain is constant ,worse in left axial (low thoracic and lumbar area)and  L hip. 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  Goal is to decrease pain with level of activity she prefers.  OBJECTIVE IMPAIRMENTS: decreased knowledge of condition, decreased strength, and pain.    PARTICIPATION LIMITATIONS: driving    REHAB POTENTIAL: Good  CLINICAL DECISION MAKING: Evolving/moderate complexity  EVALUATION COMPLEXITY: Moderate   GOALS: Goals reviewed with patient? Yes  SHORT TERM GOALS: Target date: 12/27/22  Pt will tolerate full aquatic sessions consistently without increase in pain and with improving function to demonstrate good toleration and effectiveness of intervention.  Baseline: Goal status: Met 12/14/22  2.  Pt will be indep with aquatic HEP and have decided upon value of intervention to gain pool access. Baseline:  Goal status: INITIAL  3.  Pt will report decrease of max pain to </= to 5/10 for improved daily function Baseline: 5/10  Goal status: INITIAL    LONG TERM GOALS: Target date: 01/21/22  Pt to meet stated Foto Goal 70 Baseline: 53 Goal status: INITIAL  2.  Pt will be indep with final HEP's (land and aquatic as appropriate) for continued management of condition Baseline:  Goal status: INITIAL  3.  Pt will improve hip abd strength  by 10  lbs to demonstrate improved overall physical function Baseline: See chart  Goal status: INITIAL  4.  Pt will report a decrease in normal pain to </=3/10 for improved functional ability/toleration Baseline: 5/10 Goal status: INITIAL  5.  Pt will report return to daily (weekly) exercise regimen for improved overall health and well being  Baseline:  Goal status: INITIAL    PLAN:  PT FREQUENCY: 1-2x/week  PT DURATION: 8 weeks  PLANNED INTERVENTIONS: 97164- PT Re-evaluation, 97110-Therapeutic exercises, 97530- Therapeutic activity, 97112- Neuromuscular re-education, 97535- Self Care, 19147- Manual therapy, [phone]- Gait training, [phone]- Orthotic Fit/training, [phone]- Aquatic Therapy, [phone]- Electrical stimulation (unattended), Patient/Family education, Balance training, Stair training, Taping, Dry Needling, Joint mobilization, Spinal manipulation, DME instructions, Cryotherapy, and Moist heat.  PLAN FOR NEXT SESSION: Land and aquatic: core and le strengthening, Pt instruction/edu on position and posture, management of condition.  HEP  Corrie Dandy Deal) Bailie Christenbury MPT 12/14/22 5:19 PM [facility] [address] [place], [place], [address] Phone: [phone]   Fax:  [phone]

## 2022-12-14 NOTE — Progress Notes (Signed)
  Cardiology Office Note:   Date:  12/15/2022  ID:  Patricia Lloyd, DOB [birth year], MRN [id number] PCP: Deeann Saint, MD  [facility] Providers Cardiologist:  Rollene Rotunda, MD {  History of Present Illness:   Patricia Lloyd is a 62 y.o. female who I saw in 2021 for evaluation of chest pain, dizziness and palpitations.   She also had chest pain and had a zero calcium score and negative POET (Plain Old Exercise Treadmill).   She previously had a monitor that demonstrated no arrhythmias.  She had a CT was done because of some rib pain she had a rib fracture.  This suggested that the central pulmonary artery was mildly enlarged and that it could be pulmonary hypertension.  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  She has a family history of apparently aortic dilatation.    She was here last month because she had preop prior to polypectomy.  She had the abnormal CT noted but we had already identified that there was no elevated pulmonary pressures as suggested by CT.  She had a normal echo in 2023.  She is done relatively well since I saw her.  She has some scoliosis.  She is doing some water Pilates and some land Pilates.  The patient denies any new symptoms such as chest discomfort, neck or arm discomfort. There has been no new shortness of breath, PND or orthopnea. There have been no reported palpitations, presyncope or syncope.      ROS: As stated in the HPI and negative for all other systems.  Studies Reviewed:    EKG:   NA  Risk Assessment/Calculations:              Physical Exam:   VS:  BP 132/86 (BP Location: Left Arm, Patient Position: Sitting, Cuff Size: Small)   Pulse (!) 55   Ht 5\' 4"  (1.626 m)   Wt 144 lb 12.8 oz (65.7 kg)   LMP 12/12/2012   SpO2 99%   BMI 24.85 kg/m    Wt Readings from Last 3 Encounters:  12/15/22 144 lb 12.8 oz (65.7 kg)  11/13/22 144 lb (65.3 kg)  10/17/22 142 lb (64.4 kg)     GEN: Well nourished, well developed in no acute  distress NECK: No JVD; No carotid bruits CARDIAC: RRR, no murmurs, rubs, gallops RESPIRATORY:  Clear to auscultation without rales, wheezing or rhonchi  ABDOMEN: Soft, non-tender, non-distended EXTREMITIES:  No edema; No deformity   ASSESSMENT AND PLAN:      PALPITATIONS: These were infrequent previously.  No change in therapy.   DYSLIPIDEMIA: Her LDL was 105 but has not been checked in a while.  I am going to check a fasting lipid profile and LP(a).   ABNORMAL CT:    We went through her CT again from a few years ago..  We talked about the hiatal hernia.  No further work up.    AORTIC ENLARGEMENT:   This was very mild at 31 mm.   Given her family history she can have a follow-up CT aorta in 2027 but she will call to arrange that.         Follow up with me in a few years.   Signed, Rollene Rotunda, MD

## 2022-12-15 ENCOUNTER — Ambulatory Visit: Payer: BC Managed Care – PPO | Attending: Cardiology | Admitting: Cardiology

## 2022-12-15 ENCOUNTER — Encounter: Payer: Self-pay | Admitting: Cardiology

## 2022-12-15 VITALS — BP 132/86 | HR 55 | Ht 64.0 in | Wt 144.8 lb

## 2022-12-15 DIAGNOSIS — R002 Palpitations: Secondary | ICD-10-CM

## 2022-12-15 DIAGNOSIS — E785 Hyperlipidemia, unspecified: Secondary | ICD-10-CM

## 2022-12-15 DIAGNOSIS — R072 Precordial pain: Secondary | ICD-10-CM

## 2022-12-15 DIAGNOSIS — I7789 Other specified disorders of arteries and arterioles: Secondary | ICD-10-CM | POA: Diagnosis not present

## 2022-12-15 NOTE — Patient Instructions (Signed)
Medication Instructions:  No changes. *If you need a refill on your cardiac medications before your next appointment, please call your pharmacy*   Lab Work: Lipid and Lpa today. If you have labs (blood work) drawn today and your tests are completely normal, you will receive your results only by: MyChart Message (if you have MyChart) OR A paper copy in the mail If you have any lab test that is abnormal or we need to change your treatment, we will call you to review the results.  Follow-Up: At [facility], you and your health needs are our priority.  As part of our continuing mission to provide you with exceptional heart care, we have created designated Provider Care Teams.  These Care Teams include your primary Cardiologist (physician) and Advanced Practice Providers (APPs -  Physician Assistants and Nurse Practitioners) who all work together to provide you with the care you need, when you need it.  Your next appointment:   2 year(s)  Provider:   Rollene Rotunda, MD

## 2022-12-18 LAB — LIPOPROTEIN A (LPA): Lipoprotein (a): 132 nmol/L — ABNORMAL HIGH (ref <75.0)

## 2022-12-18 LAB — LIPID PANEL
Chol/HDL Ratio: 3.1 {ratio} (ref 0.0–4.4)
Cholesterol, Total: 234 mg/dL — ABNORMAL HIGH (ref 100–199)
HDL: 75 mg/dL (ref >39)
LDL Chol Calc (NIH): 145 mg/dL — ABNORMAL HIGH (ref 0–99)
Triglycerides: 79 mg/dL (ref 0–149)
VLDL Cholesterol Cal: 14 mg/dL (ref 5–40)

## 2022-12-22 ENCOUNTER — Ambulatory Visit (HOSPITAL_BASED_OUTPATIENT_CLINIC_OR_DEPARTMENT_OTHER): Payer: BC Managed Care – PPO | Admitting: Physical Therapy

## 2022-12-22 ENCOUNTER — Encounter (HOSPITAL_BASED_OUTPATIENT_CLINIC_OR_DEPARTMENT_OTHER): Payer: Self-pay | Admitting: Physical Therapy

## 2022-12-22 DIAGNOSIS — M5459 Other low back pain: Secondary | ICD-10-CM

## 2022-12-22 DIAGNOSIS — M6281 Muscle weakness (generalized): Secondary | ICD-10-CM | POA: Diagnosis not present

## 2022-12-22 DIAGNOSIS — R293 Abnormal posture: Secondary | ICD-10-CM | POA: Diagnosis not present

## 2022-12-22 NOTE — Therapy (Signed)
OUTPATIENT PHYSICAL THERAPY THORACOLUMBAR TREATMENT   Patient Name: Patricia Lloyd MRN: [id number] DOB:24-Sep-1960, 62 y.o., female Today's Date: 12/22/2022  END OF SESSION:  PT End of Session - 12/22/22 1210     Visit Number 4    Number of Visits 12    Date for PT Re-Evaluation 01/22/23    Authorization Type BCBS    PT Start Time 1031    PT Stop Time 1115    PT Time Calculation (min) 44 min    Activity Tolerance Patient tolerated treatment well    Behavior During Therapy WFL for tasks assessed/performed               Past Medical History:  Diagnosis Date   Fibromyalgia    H/O fatigue    Insomnia    PONV (postoperative nausea and vomiting)    severe   Past Surgical History:  Procedure Laterality Date   CESAREAN SECTION  1995   CESAREAN SECTION  2000   DILATATION & CURETTAGE/HYSTEROSCOPY WITH TRUECLEAR N/A 01/01/2013   Procedure: DILATATION & CURETTAGE/HYSTEROSCOPY WITH TRUCLEAR;  Surgeon: Meriel Pica, MD;  Location: [facility];  Service: Gynecology;  Laterality: N/A;   SHOULDER SURGERY  05/03/2010   Left shoulder surgery, Dr.Doldorf     TMJ ARTHROPLASTY  1990   Patient Active Problem List   Diagnosis Date Noted   Aortic root enlargement (HCC) 12/14/2022   Somatic dysfunction of spine, sacral 10/17/2022   Cervical disc disorder with radiculopathy of cervical region 04/27/2021   Lumbar radiculopathy 04/27/2021   Dyslipidemia 03/02/2021   Fracture of one rib of left side with delayed healing 02/19/2020   Lateral pain of left hip 07/15/2019   Precordial chest pain 01/31/2019   Palpitation 01/30/2019   Educated about COVID-19 virus infection 01/30/2019   Urinary incontinence 01/20/2019   Loss of transverse plantar arch of right foot 07/02/2018   Peroneal tendinitis, right 07/02/2018   Morton neuroma, right 07/02/2018   Sinusitis, acute 04/21/2015   Cervical radiculitis 09/03/2014   Left arm pain 09/02/2014   Strain of right tibialis anterior muscle  06/24/2014   Tendonitis of left rotator cuff 06/24/2014   Fibromyalgia 06/10/2014   Varicose veins of bilateral lower extremities with other complications 01/28/2013   Leg edema, right 01/28/2013   Dizziness and giddiness 12/25/2012    PCP:   Enid Baas, MD    REFERRING PROVIDER: Judi Saa, DO /   Enid Baas, MD    REFERRING DIAG:  Scoliosis of lumbar spine, unspecified scoliosis type  M51.369 (ICD-10-CM) - Degeneration of intervertebral disc of lumbar region without discogenic back pain or lower extremity pain    Lumbar radiculopathy  M25.552 (ICD-10-CM) - Lateral pain of left hip    Rationale for Evaluation and Treatment: Rehabilitation  THERAPY DIAG:  Other low back pain  Muscle weakness (generalized)  Abnormal posture  ONSET DATE: increasing over then last year  SUBJECTIVE:  SUBJECTIVE STATEMENT: Pt reports she just returned from traveling to [place]; body feels beat up from the walking, etc.  She states she had some muscle soreness after last session. Still looking into membership of pool (considering [facility], likes warmer pool).   PERTINENT HISTORY:  -Foot pain on the right and symptoms consistent with a -Morton's neuroma  -bilateral hip pain that hurts in her groin/ MRIs which showed some labral pathology in the left hip and some mild arthritis of both hip joints -significant scoliosis with a convex curve    PAIN:  Are you having pain? Yes: NPRS scale: Current 5/10  Pain location: Left low thoracic and lumbar area to left into hip Pain description: constant in left lumbar spine and hip Aggravating factors: walking; standing, pilates  Relieving factors: nothing  PRECAUTIONS: None  RED FLAGS: None   WEIGHT BEARING RESTRICTIONS: No  FALLS:  Has patient fallen in  last 6 months? 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  DIAGNOSTIC FINDINGS:  Mild levoscoliosis of lumbar spine is noted. Minimal grade 1  retrolisthesis of L1-2 is noted secondary to severe degenerative  disc disease at this level. Mild grade 1 anterolisthesis of L4-5 is  noted secondary to posterior facet joint hypertrophy. Moderate  degenerative disc disease is noted at L5-S1.   PATIENT SURVEYS:  FOTO Primary score: 68% risk adjusted 53 goal 70    COGNITION: Overall cognitive status: Within functional limits for tasks assessed     SENSATION: WFL  MUSCLE LENGTH: Hamstrings: sitting wfl   POSTURE:  left higher than right Pt wearing right shoe orthotic due to pronation  PALPATION: Slight TTP bilat hip about bursa area  LUMBAR ROM:  WFL  LOWER EXTREMITY STRENGTH     MMT Right eval Left eval  Hip flexion 48.1 45.0  Hip extension    Hip abduction 24.8 25.5  Hip adduction    Hip internal rotation    Hip external rotation    Knee flexion    Knee extension 45.7 46.6  Ankle dorsiflexion    Ankle plantarflexion    Ankle inversion    Ankle eversion     (Blank rows = not tested)  LOWER EXTREMITY MMT:    MMT Right eval Left eval  Hip flexion    Hip extension    Hip abduction    Hip adduction    Hip internal rotation    Hip external rotation    Knee flexion    Knee extension    Ankle dorsiflexion    Ankle plantarflexion    Ankle inversion    Ankle eversion     (Blank rows = not tested)    FUNCTIONAL TESTS:  5 times sit to stand: 13.72 Timed up and go (TUG): 9.77   4 stage balance:  passed GAIT: Distance walked: 500 ft Assistive device utilized: None Level of assistance: Complete Independence Comments: wfl  TODAY'S TREATMENT:  Pt seen for aquatic therapy today.  Treatment took place in water 3.5-4.75 ft in depth at the [facility] pool. Temp of water was 91.  Pt entered/exited the pool via stairs independently with bilat rail.  * walking forward/ backward multiple laps with cues for reciprocal arm swing, vertical trunk * side stepping with rainbow hand floats and arm addct/ abdct x 2 lap-> into wide squat x 1 lap * farmer carry with single yellow rainbow hand float under water at side (R side 1x, L side 2x) * staggered stance with bilat horiz abdct/ addct with rainbow hand floats x 10 each  * TrA set with solid noodle pull down to thighs with controlled return to surface x 10, with 2 stops,  chest deep wide stance then staggered * straddling noodle: cycling multiple laps, varying speed, cross country ski, * seated on noodle like swing:  seated balance then with single arm raise; double arm raise, hands on lap *reverse plank on yellow noodle x 5-10s * plank to/from superman x 10 reps  * 3 way LE stretch with ankle on solid noodle -> quad stretch  * walking forward/ backward 2 lap with reciprocal arm swing with light resistance bells   Pt requires the buoyancy and hydrostatic pressure of water for support, and to offload joints by unweighting joint load by at least 50 % in navel deep water and by at least 75-80% in chest to neck deep water.  Viscosity of the water is needed for resistance of strengthening. Water current perturbations provides challenge to standing balance requiring increased core activation.     PATIENT EDUCATION:  Education details: Intro to aquatic therapy  Person educated: Patient Education method:  Explanation Education comprehension: verbalized understanding  HOME EXERCISE PROGRAM: TBA  ASSESSMENT:  CLINICAL IMPRESSION: Pt tolerated aquatic progression well. Back pain decreased with suspended cycling but then returned to 5/10 when exercising with feet back on ground in water (submerged 75%).  Trialed resistance bells (light); may try fins next session.  Goals ongoing.   From initial evaluaiton: Patient is a 62 y.o. f who was seen today for physical therapy evaluation and treatment for LB and hip pain.  She complains of Right knee and left hip pain, and uses an orthotic to correct supination of right foot which appears consistent with dx of levoscoliosis of lumbar spine. 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 She is active in Pilates but reports her pain is increased afterward.  Pain is constant ,worse in left axial (low thoracic and lumbar area)and  L hip. 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  Goal is to decrease pain with level of activity she prefers.  OBJECTIVE IMPAIRMENTS: decreased knowledge of condition, decreased strength, and pain.    PARTICIPATION LIMITATIONS: driving    REHAB POTENTIAL: Good  CLINICAL DECISION MAKING: Evolving/moderate complexity  EVALUATION COMPLEXITY: Moderate   GOALS: Goals reviewed with patient? Yes  SHORT TERM GOALS: Target date: 12/27/22  Pt will tolerate full aquatic sessions consistently without increase in pain and with improving function to demonstrate good toleration and effectiveness of intervention.  Baseline: Goal status: Met 12/14/22  2.  Pt will be indep with aquatic HEP and have decided upon value of intervention to gain pool access. Baseline:  Goal status: In progress - 12/22/22  3.  Pt will report decrease of max pain to </= to 5/10 for improved daily function Baseline: 8/10 max,  daily=5/10 Goal  status: In Progress -12/22/22    LONG TERM GOALS: Target date:  01/21/22  Pt to meet stated Foto Goal 70 Baseline: 53 Goal status: INITIAL  2.  Pt will be indep with final HEP's (land and aquatic as appropriate) for continued management of condition Baseline:  Goal status: INITIAL  3.  Pt will improve hip abd strength  by 10 lbs to demonstrate improved overall physical function Baseline: See chart  Goal status: INITIAL  4.  Pt will report a decrease in normal pain to </=3/10 for improved functional ability/toleration Baseline: 5/10 Goal status: INITIAL  5.  Pt will report return to daily (weekly) exercise regimen for improved overall health and well being  Baseline:  Goal status: INITIAL    PLAN:  PT FREQUENCY: 1-2x/week  PT DURATION: 8 weeks  PLANNED INTERVENTIONS: 97164- PT Re-evaluation, 97110-Therapeutic exercises, 97530- Therapeutic activity, 97112- Neuromuscular re-education, 97535- Self Care, 40981- Manual therapy, [phone]- Gait training, [phone]- Orthotic Fit/training, [phone]- Aquatic Therapy, [phone]- Electrical stimulation (unattended), Patient/Family education, Balance training, Stair training, Taping, Dry Needling, Joint mobilization, Spinal manipulation, DME instructions, Cryotherapy, and Moist heat.  PLAN FOR NEXT SESSION: Land and aquatic: core and le strengthening, Pt instruction/edu on position and posture, management of condition.  HEP  Mayer Camel, [place] 12/22/22 12:16 PM [facility] [address] [place], [place], [address] Phone: [phone]   Fax:  [phone]

## 2022-12-25 ENCOUNTER — Ambulatory Visit (HOSPITAL_BASED_OUTPATIENT_CLINIC_OR_DEPARTMENT_OTHER): Payer: BC Managed Care – PPO | Admitting: Physical Therapy

## 2022-12-25 ENCOUNTER — Encounter (HOSPITAL_BASED_OUTPATIENT_CLINIC_OR_DEPARTMENT_OTHER): Payer: Self-pay | Admitting: Physical Therapy

## 2022-12-25 DIAGNOSIS — M5459 Other low back pain: Secondary | ICD-10-CM | POA: Diagnosis not present

## 2022-12-25 DIAGNOSIS — R293 Abnormal posture: Secondary | ICD-10-CM

## 2022-12-25 DIAGNOSIS — M6281 Muscle weakness (generalized): Secondary | ICD-10-CM | POA: Diagnosis not present

## 2022-12-25 NOTE — Therapy (Signed)
OUTPATIENT PHYSICAL THERAPY THORACOLUMBAR TREATMENT   Patient Name: Patricia Lloyd MRN: [id number] DOB:1960-06-05, 62 y.o., female Today's Date: 12/25/2022  END OF SESSION:  PT End of Session - 12/25/22 1454     Visit Number 5    Number of Visits 12    Date for PT Re-Evaluation 01/22/23    Authorization Type BCBS    PT Start Time 1455   pt arrived late   PT Stop Time 1528    PT Time Calculation (min) 33 min    Activity Tolerance Patient tolerated treatment well    Behavior During Therapy WFL for tasks assessed/performed                Past Medical History:  Diagnosis Date   Fibromyalgia    H/O fatigue    Insomnia    PONV (postoperative nausea and vomiting)    severe   Past Surgical History:  Procedure Laterality Date   CESAREAN SECTION  1995   CESAREAN SECTION  2000   DILATATION & CURETTAGE/HYSTEROSCOPY WITH TRUECLEAR N/A 01/01/2013   Procedure: DILATATION & CURETTAGE/HYSTEROSCOPY WITH TRUCLEAR;  Surgeon: Meriel Pica, MD;  Location: [facility];  Service: Gynecology;  Laterality: N/A;   SHOULDER SURGERY  05/03/2010   Left shoulder surgery, Dr.Doldorf     TMJ ARTHROPLASTY  1990   Patient Active Problem List   Diagnosis Date Noted   Aortic root enlargement (HCC) 12/14/2022   Somatic dysfunction of spine, sacral 10/17/2022   Cervical disc disorder with radiculopathy of cervical region 04/27/2021   Lumbar radiculopathy 04/27/2021   Dyslipidemia 03/02/2021   Fracture of one rib of left side with delayed healing 02/19/2020   Lateral pain of left hip 07/15/2019   Precordial chest pain 01/31/2019   Palpitation 01/30/2019   Educated about COVID-19 virus infection 01/30/2019   Urinary incontinence 01/20/2019   Loss of transverse plantar arch of right foot 07/02/2018   Peroneal tendinitis, right 07/02/2018   Morton neuroma, right 07/02/2018   Sinusitis, acute 04/21/2015   Cervical radiculitis 09/03/2014   Left arm pain 09/02/2014   Strain of right tibialis  anterior muscle 06/24/2014   Tendonitis of left rotator cuff 06/24/2014   Fibromyalgia 06/10/2014   Varicose veins of bilateral lower extremities with other complications 01/28/2013   Leg edema, right 01/28/2013   Dizziness and giddiness 12/25/2012    PCP:   Enid Baas, MD    REFERRING PROVIDER: Judi Saa, DO /   Enid Baas, MD    REFERRING DIAG:  Scoliosis of lumbar spine, unspecified scoliosis type  M51.369 (ICD-10-CM) - Degeneration of intervertebral disc of lumbar region without discogenic back pain or lower extremity pain    Lumbar radiculopathy  M25.552 (ICD-10-CM) - Lateral pain of left hip    Rationale for Evaluation and Treatment: Rehabilitation  THERAPY DIAG:  Other low back pain  Muscle weakness (generalized)  Abnormal posture  ONSET DATE: increasing over then last year  SUBJECTIVE:  SUBJECTIVE STATEMENT: Left hip and low back pain with driving.   PERTINENT HISTORY:  -Foot pain on the right and symptoms consistent with a -Morton's neuroma  -bilateral hip pain that hurts in her groin/ MRIs which showed some labral pathology in the left hip and some mild arthritis of both hip joints -significant scoliosis with a convex curve    PAIN:  Are you having pain? Yes: NPRS scale: Current 6/10  Pain location: Left low thoracic and lumbar area to left into hip Pain description: constant in left lumbar spine and hip Aggravating factors: walking; standing, pilates  Relieving factors: nothing  PRECAUTIONS: None  RED FLAGS: None   WEIGHT BEARING RESTRICTIONS: No  FALLS:  Has patient fallen in last 6 months? 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  DIAGNOSTIC FINDINGS:  Mild levoscoliosis of lumbar spine is noted. Minimal grade 1  retrolisthesis of L1-2 is noted secondary to severe degenerative  disc disease at this level. Mild grade 1 anterolisthesis of L4-5 is  noted secondary to posterior facet joint hypertrophy. Moderate  degenerative disc disease is noted at L5-S1.   PATIENT SURVEYS:  FOTO Primary score: 68% risk adjusted 53 goal 70    COGNITION: Overall cognitive status: Within functional limits for tasks assessed     SENSATION: WFL  MUSCLE LENGTH: Hamstrings: sitting wfl   POSTURE:  left higher than right Pt wearing right shoe orthotic due to pronation  PALPATION: Slight TTP bilat hip about bursa area  LUMBAR ROM:  WFL  LOWER EXTREMITY STRENGTH     MMT Right eval Left eval  Hip flexion 48.1 45.0  Hip extension    Hip abduction 24.8 25.5  Hip adduction    Hip internal rotation    Hip external rotation    Knee flexion    Knee extension 45.7 46.6  Ankle dorsiflexion    Ankle plantarflexion    Ankle inversion    Ankle eversion     (Blank rows = not tested)  LOWER EXTREMITY MMT:    MMT Right eval Left eval  Hip flexion    Hip extension    Hip abduction    Hip adduction    Hip internal rotation    Hip external rotation    Knee flexion    Knee extension    Ankle dorsiflexion    Ankle plantarflexion    Ankle inversion    Ankle eversion     (Blank rows = not tested)    FUNCTIONAL TESTS:  5 times sit to stand: 13.72 Timed up and go (TUG): 9.77   4 stage balance: passed GAIT: Distance walked: 500 ft Assistive device utilized: None Level of assistance: Complete Independence Comments: wfl  TODAY'S TREATMENT:  Treatment                            12/16:  Hooklying ab set for rib flare reduction Progression to hundreds- UE only, +EL 90, +head lift Qped ab set Prone STM to mid-thoracic paraspinals Seated rib cage depression and breathing into post rib cage.       PATIENT EDUCATION:  Education details: Intro to aquatic therapy  Person educated: Patient Education method: Explanation Education comprehension: verbalized understanding  HOME EXERCISE PROGRAM: TBA  ASSESSMENT:  CLINICAL IMPRESSION: Rib cage flare evident with limited posterior thoracic rib mobility. Able to feel release of paraspanals with proper core control. Will work on incorporating this into pilates and regular posture.   From initial evaluaiton: Patient is a 62 y.o. f who was seen today for physical therapy evaluation and treatment for LB and hip pain.  She complains of Right knee and left hip pain, and uses an orthotic to correct supination of right foot which appears consistent with dx of levoscoliosis of lumbar spine. 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 She is active in Pilates but reports her pain is increased afterward.  Pain is constant ,worse in left axial (low thoracic and lumbar area)and  L hip. 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  Goal is to decrease pain with level of activity she prefers.  OBJECTIVE IMPAIRMENTS: decreased knowledge of condition, decreased strength, and pain.    PARTICIPATION LIMITATIONS: driving    REHAB POTENTIAL: Good  CLINICAL DECISION MAKING: Evolving/moderate complexity  EVALUATION COMPLEXITY: Moderate   GOALS: Goals reviewed with patient? Yes  SHORT TERM GOALS: Target date: 12/27/22  Pt will tolerate full aquatic sessions consistently without increase in pain and with improving function to demonstrate good toleration and  effectiveness of intervention.  Baseline: Goal status: Met 12/14/22  2.  Pt will be indep with aquatic HEP and have decided upon value of intervention to gain pool access. Baseline:  Goal status: In progress - 12/22/22  3.  Pt will report decrease of max pain to </= to 5/10 for improved daily function Baseline: 8/10 max,  daily=5/10 Goal status: In Progress -12/22/22    LONG TERM GOALS: Target date: 01/21/22  Pt to meet stated Foto Goal 70 Baseline: 53 Goal status: INITIAL  2.  Pt will be indep with final HEP's (land and aquatic as appropriate) for continued management of condition Baseline:  Goal status: INITIAL  3.  Pt will improve hip abd strength  by 10 lbs to demonstrate improved overall physical function Baseline: See chart  Goal status: INITIAL  4.  Pt will report a decrease in normal pain to </=3/10 for improved functional ability/toleration Baseline: 5/10 Goal status: INITIAL  5.  Pt will report return to daily (weekly) exercise regimen for improved overall health and well being  Baseline:  Goal status: INITIAL    PLAN:  PT FREQUENCY: 1-2x/week  PT DURATION: 8 weeks  PLANNED INTERVENTIONS: 97164- PT Re-evaluation, 97110-Therapeutic exercises, 97530- Therapeutic activity, 97112- Neuromuscular re-education, 97535- Self Care, 40981- Manual therapy, [phone]- Gait training, [phone]- Orthotic Fit/training, [phone]- Aquatic Therapy, [phone]- Electrical stimulation (unattended), Patient/Family education, Balance training, Stair training, Taping, Dry Needling, Joint mobilization, Spinal manipulation, DME instructions, Cryotherapy, and Moist heat.  PLAN FOR NEXT SESSION: Land and aquatic: core and le strengthening, Pt instruction/edu on position and posture, management of condition.  HEP  Ranbir Chew C. Jorey Dollard  PT, DPT 12/25/22 4:35 PM  [facility] [address] [place], [place], [address] Phone: [phone]   Fax:   [phone]

## 2022-12-28 ENCOUNTER — Encounter (HOSPITAL_BASED_OUTPATIENT_CLINIC_OR_DEPARTMENT_OTHER): Payer: Self-pay | Admitting: Physical Therapy

## 2022-12-28 ENCOUNTER — Ambulatory Visit (HOSPITAL_BASED_OUTPATIENT_CLINIC_OR_DEPARTMENT_OTHER): Payer: BC Managed Care – PPO | Admitting: Physical Therapy

## 2022-12-28 DIAGNOSIS — R293 Abnormal posture: Secondary | ICD-10-CM

## 2022-12-28 DIAGNOSIS — M5459 Other low back pain: Secondary | ICD-10-CM | POA: Diagnosis not present

## 2022-12-28 DIAGNOSIS — M6281 Muscle weakness (generalized): Secondary | ICD-10-CM

## 2022-12-28 NOTE — Therapy (Signed)
OUTPATIENT PHYSICAL THERAPY THORACOLUMBAR TREATMENT   Patient Name: Patricia Lloyd MRN: [id number] DOB:01-Apr-1960, 62 y.o., female Today's Date: 12/28/2022  END OF SESSION:  PT End of Session - 12/28/22 1045     Visit Number 6    Number of Visits 12    Date for PT Re-Evaluation 01/22/23    Authorization Type BCBS    Authorization Time Period 12/3-1/31    Authorization - Visit Number 4    Authorization - Number of Visits 5    PT Start Time 1031    PT Stop Time 1115    PT Time Calculation (min) 44 min    Activity Tolerance Patient tolerated treatment well    Behavior During Therapy WFL for tasks assessed/performed             Past Medical History:  Diagnosis Date   Fibromyalgia    H/O fatigue    Insomnia    PONV (postoperative nausea and vomiting)    severe   Past Surgical History:  Procedure Laterality Date   CESAREAN SECTION  1995   CESAREAN SECTION  2000   DILATATION & CURETTAGE/HYSTEROSCOPY WITH TRUECLEAR N/A 01/01/2013   Procedure: DILATATION & CURETTAGE/HYSTEROSCOPY WITH TRUCLEAR;  Surgeon: Meriel Pica, MD;  Location: [facility];  Service: Gynecology;  Laterality: N/A;   SHOULDER SURGERY  05/03/2010   Left shoulder surgery, Dr.Doldorf     TMJ ARTHROPLASTY  1990   Patient Active Problem List   Diagnosis Date Noted   Aortic root enlargement (HCC) 12/14/2022   Somatic dysfunction of spine, sacral 10/17/2022   Cervical disc disorder with radiculopathy of cervical region 04/27/2021   Lumbar radiculopathy 04/27/2021   Dyslipidemia 03/02/2021   Fracture of one rib of left side with delayed healing 02/19/2020   Lateral pain of left hip 07/15/2019   Precordial chest pain 01/31/2019   Palpitation 01/30/2019   Educated about COVID-19 virus infection 01/30/2019   Urinary incontinence 01/20/2019   Loss of transverse plantar arch of right foot 07/02/2018   Peroneal tendinitis, right 07/02/2018   Morton neuroma, right 07/02/2018   Sinusitis, acute 04/21/2015    Cervical radiculitis 09/03/2014   Left arm pain 09/02/2014   Strain of right tibialis anterior muscle 06/24/2014   Tendonitis of left rotator cuff 06/24/2014   Fibromyalgia 06/10/2014   Varicose veins of bilateral lower extremities with other complications 01/28/2013   Leg edema, right 01/28/2013   Dizziness and giddiness 12/25/2012    PCP:   Enid Baas, MD    REFERRING PROVIDER: Judi Saa, DO /   Enid Baas, MD    REFERRING DIAG:  Scoliosis of lumbar spine, unspecified scoliosis type  M51.369 (ICD-10-CM) - Degeneration of intervertebral disc of lumbar region without discogenic back pain or lower extremity pain    Lumbar radiculopathy  M25.552 (ICD-10-CM) - Lateral pain of left hip    Rationale for Evaluation and Treatment: Rehabilitation  THERAPY DIAG:  Other low back pain  Muscle weakness (generalized)  Abnormal posture  ONSET DATE: increasing over then last year  SUBJECTIVE:  SUBJECTIVE STATEMENT: "I was really sore after the session."  Pt reports she has more awareness of posture after session.   PERTINENT HISTORY:  -Foot pain on the right and symptoms consistent with a -Morton's neuroma  -bilateral hip pain that hurts in her groin/ MRIs which showed some labral pathology in the left hip and some mild arthritis of both hip joints -significant scoliosis with a convex curve    PAIN:  Are you having pain? Yes: NPRS scale: Current 6/10  Pain location: Left low thoracic and lumbar area to left into hip, Rt knee  Pain description: constant in left lumbar spine and hip Aggravating factors: walking; standing, pilates  Relieving factors: nothing  PRECAUTIONS: None  RED FLAGS: None   WEIGHT BEARING RESTRICTIONS: No  FALLS:  Has patient fallen in last 6 months?  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  DIAGNOSTIC FINDINGS:  Mild levoscoliosis of lumbar spine is noted. Minimal grade 1  retrolisthesis of L1-2 is noted secondary to severe degenerative  disc disease at this level. Mild grade 1 anterolisthesis of L4-5 is  noted secondary to posterior facet joint hypertrophy. Moderate  degenerative disc disease is noted at L5-S1.   PATIENT SURVEYS:  FOTO Primary score: 68% risk adjusted 53 goal 70    COGNITION: Overall cognitive status: Within functional limits for tasks assessed     SENSATION: WFL  MUSCLE LENGTH: Hamstrings: sitting wfl   POSTURE:  left higher than right Pt wearing right shoe orthotic due to pronation  PALPATION: Slight TTP bilat hip about bursa area  LUMBAR ROM:  WFL  LOWER EXTREMITY STRENGTH     MMT Right eval Left eval  Hip flexion 48.1 45.0  Hip extension    Hip abduction 24.8 25.5  Hip adduction    Hip internal rotation    Hip external rotation    Knee flexion    Knee extension 45.7 46.6  Ankle dorsiflexion    Ankle plantarflexion    Ankle inversion    Ankle eversion     (Blank rows = not tested)  LOWER EXTREMITY MMT:    MMT Right eval Left eval  Hip flexion    Hip extension    Hip abduction    Hip adduction    Hip internal rotation    Hip external rotation    Knee flexion    Knee extension    Ankle dorsiflexion    Ankle plantarflexion    Ankle inversion    Ankle eversion     (Blank rows = not tested)    FUNCTIONAL TESTS:  5 times sit to stand: 13.72 Timed up and go (TUG): 9.77   4 stage balance: passed GAIT: Distance walked:  500 ft Assistive device utilized: None Level of assistance: Complete Independence Comments: wfl  TODAY'S TREATMENT:  12/28/22: Pt seen for aquatic therapy today.  Treatment took place in water 3.5-4.75 ft in depth at the [facility] pool. Temp of water was 91.  Pt entered/exited the pool via stairs independently with bilat rail.   * straddling noodle: cycling multiple laps with breast stroke arms, varying speed, cross country ski, suspended jumping jacks * superman to/from plank with UE on yellow noodle x 10 * seated balance on yellow noodle (like swing) with hands out of water, cues to avoid rib flare * side plank with hand on bench in water -> star fish x 5 each side, cues for alignment * walking forward/ backward multiple laps with cues for reciprocal arm swing, with light resistance bells * side stepping with light resistance bells and arm addct/ abdct  * staggered stance with reciprocal arm swing (10 slow, 10 fast, each leg forward) -> with horiz abdct /addct (10 slow, 10 fast, each leg forward  * UE on yellow hand floats, pink fins donned- 3 way LE kicks x 10 each, then hip abdct/ addct crossing midline x 10 each LE * 3 way LE stretch with ankle on solid noodle -> quad stretch    Treatment                            12/16:  Hooklying ab set for rib flare reduction Progression to hundreds- UE only, +EL 90, +head lift Qped ab set Prone STM to mid-thoracic paraspinals Seated rib cage depression and breathing into post rib cage.    PATIENT EDUCATION:  Education details: Intro to aquatic therapy  Person educated: Patient Education method: Explanation Education comprehension: verbalized understanding  HOME EXERCISE PROGRAM: TBA  ASSESSMENT:  CLINICAL IMPRESSION: Positive response to last land based session.  Reviewed cues for avoiding rib flare  on L with stabilization exercises in water.  Introduced fins with LE exercises.  Good tolerance for all aquatic exercises.  Starfish (side plank at bench in water) presented good challenge for her.    Will work on incorporating rib mobility into pilates and regular posture during land based sessions.    OBJECTIVE IMPAIRMENTS: decreased knowledge of condition, decreased strength, and pain.    PARTICIPATION LIMITATIONS: driving    REHAB POTENTIAL: Good  CLINICAL DECISION MAKING: Evolving/moderate complexity  EVALUATION COMPLEXITY: Moderate   GOALS: Goals reviewed with patient? Yes  SHORT TERM GOALS: Target date: 12/27/22  Pt will tolerate full aquatic sessions consistently without increase in pain and with improving function to demonstrate good toleration and effectiveness of intervention.  Baseline: Goal status: Met 12/14/22  2.  Pt will be indep with aquatic HEP and have decided upon value of intervention to gain pool access. Baseline:  Goal status: In progress - 12/22/22  3.  Pt will report decrease of max pain to </= to 5/10 for improved daily function Baseline: 8/10 max,  daily=5/10 Goal status: In Progress -12/22/22    LONG TERM GOALS: Target date: 01/21/22  Pt to meet stated Foto Goal 70 Baseline: 53 Goal status: INITIAL  2.  Pt will be indep with final HEP's (land and aquatic as appropriate) for continued management of condition Baseline:  Goal status: INITIAL  3.  Pt will improve hip abd strength  by 10 lbs to demonstrate improved overall physical function Baseline: See chart  Goal status: INITIAL  4.  Pt will report a decrease in normal pain to </=3/10 for improved functional ability/toleration Baseline: 5/10 Goal status: INITIAL  5.  Pt will report return to daily (weekly) exercise regimen for improved overall health and well being  Baseline:  Goal status: INITIAL    PLAN:  PT FREQUENCY: 1-2x/week  PT DURATION: 8 weeks  PLANNED INTERVENTIONS:  97164- PT Re-evaluation, 97110-Therapeutic exercises, 97530- Therapeutic activity, 97112- Neuromuscular re-education, 97535- Self Care, 16109- Manual therapy, [phone]- Gait training, [phone]- Orthotic Fit/training, [phone]- Aquatic Therapy, [phone]- Electrical stimulation (unattended), Patient/Family education, Balance training, Stair training, Taping, Dry Needling, Joint mobilization, Spinal manipulation, DME instructions, Cryotherapy, and Moist heat.  PLAN FOR NEXT SESSION: Land and aquatic: core and le strengthening, Pt instruction/edu on position and posture, management of condition.  HEP  Mayer Camel, PTA 12/28/22 11:31 AM [facility] [address] [place], [place], [address] Phone: [phone]   Fax:  [phone]

## 2022-12-29 ENCOUNTER — Encounter: Payer: Self-pay | Admitting: *Deleted

## 2022-12-29 ENCOUNTER — Encounter (HOSPITAL_BASED_OUTPATIENT_CLINIC_OR_DEPARTMENT_OTHER): Payer: Self-pay | Admitting: Physical Therapy

## 2023-01-01 ENCOUNTER — Other Ambulatory Visit: Payer: Self-pay | Admitting: Family Medicine

## 2023-01-05 DIAGNOSIS — Z1231 Encounter for screening mammogram for malignant neoplasm of breast: Secondary | ICD-10-CM | POA: Diagnosis not present

## 2023-01-08 ENCOUNTER — Ambulatory Visit (HOSPITAL_BASED_OUTPATIENT_CLINIC_OR_DEPARTMENT_OTHER): Payer: BC Managed Care – PPO | Admitting: Physical Therapy

## 2023-01-08 ENCOUNTER — Encounter (HOSPITAL_BASED_OUTPATIENT_CLINIC_OR_DEPARTMENT_OTHER): Payer: Self-pay | Admitting: Physical Therapy

## 2023-01-08 DIAGNOSIS — R293 Abnormal posture: Secondary | ICD-10-CM

## 2023-01-08 DIAGNOSIS — M5459 Other low back pain: Secondary | ICD-10-CM

## 2023-01-08 DIAGNOSIS — M6281 Muscle weakness (generalized): Secondary | ICD-10-CM

## 2023-01-08 NOTE — Therapy (Signed)
OUTPATIENT PHYSICAL THERAPY THORACOLUMBAR TREATMENT   Patient Name: KRISTOPHER HASSE MRN: [id number] DOB:[birth year], 62 y.o., female Today's Date: 01/08/2023  END OF SESSION:  PT End of Session - 01/08/23 1132     Visit Number 7    Number of Visits 12    Date for PT Re-Evaluation 01/22/23    Authorization Type BCBS    Authorization Time Period 12/3-1/31    Authorization - Visit Number 5    Authorization - Number of Visits 5    PT Start Time 1115    PT Stop Time 1155    PT Time Calculation (min) 40 min    Activity Tolerance Patient tolerated treatment well    Behavior During Therapy WFL for tasks assessed/performed             Past Medical History:  Diagnosis Date   Fibromyalgia    H/O fatigue    Insomnia    PONV (postoperative nausea and vomiting)    severe   Past Surgical History:  Procedure Laterality Date   CESAREAN SECTION  1995   CESAREAN SECTION  2000   DILATATION & CURETTAGE/HYSTEROSCOPY WITH TRUECLEAR N/A 01/01/2013   Procedure: DILATATION & CURETTAGE/HYSTEROSCOPY WITH TRUCLEAR;  Surgeon: Meriel Pica, MD;  Location: [facility];  Service: Gynecology;  Laterality: N/A;   SHOULDER SURGERY  05/03/2010   Left shoulder surgery, Dr.Doldorf     TMJ ARTHROPLASTY  1990   Patient Active Problem List   Diagnosis Date Noted   Aortic root enlargement (HCC) 12/14/2022   Somatic dysfunction of spine, sacral 10/17/2022   Cervical disc disorder with radiculopathy of cervical region 04/27/2021   Lumbar radiculopathy 04/27/2021   Dyslipidemia 03/02/2021   Fracture of one rib of left side with delayed healing 02/19/2020   Lateral pain of left hip 07/15/2019   Precordial chest pain 01/31/2019   Palpitation 01/30/2019   Educated about COVID-19 virus infection 01/30/2019   Urinary incontinence 01/20/2019   Loss of transverse plantar arch of right foot 07/02/2018   Peroneal tendinitis, right 07/02/2018   Morton neuroma, right 07/02/2018   Sinusitis, acute 04/21/2015    Cervical radiculitis 09/03/2014   Left arm pain 09/02/2014   Strain of right tibialis anterior muscle 06/24/2014   Tendonitis of left rotator cuff 06/24/2014   Fibromyalgia 06/10/2014   Varicose veins of bilateral lower extremities with other complications 01/28/2013   Leg edema, right 01/28/2013   Dizziness and giddiness 12/25/2012    PCP:   Enid Baas, MD    REFERRING PROVIDER: Judi Saa, DO /   Enid Baas, MD    REFERRING DIAG:  Scoliosis of lumbar spine, unspecified scoliosis type  M51.369 (ICD-10-CM) - Degeneration of intervertebral disc of lumbar region without discogenic back pain or lower extremity pain    Lumbar radiculopathy  M25.552 (ICD-10-CM) - Lateral pain of left hip    Rationale for Evaluation and Treatment: Rehabilitation  THERAPY DIAG:  Other low back pain  Muscle weakness (generalized)  Abnormal posture  ONSET DATE: increasing over then last year  SUBJECTIVE:  SUBJECTIVE STATEMENT: Pt reports she gets relief of pain when in the water. She has done a lot of driving for work/holidays and reports increased pain in Rt knee. She complains that she has difficulty sleeping due to pain when sleeping on her sides.   POOL ACCESS:  Pt plans to join [employer] at d/c.   PERTINENT HISTORY:  -Foot pain on the right and symptoms consistent with a -Morton's neuroma  -bilateral hip pain that hurts in her groin/ MRIs which showed some labral pathology in the left hip and some mild arthritis of both hip joints -significant scoliosis with a convex curve    PAIN:  Are you having pain? Yes: NPRS scale: Current 7/10- Rt hip/knee,  L hip 5/10  Pain location: see above Pain description: constant in left lumbar spine and hip Aggravating factors: walking; standing, pilates   Relieving factors: nothing  PRECAUTIONS: None  RED FLAGS: None   WEIGHT BEARING RESTRICTIONS: No  FALLS:  Has patient fallen in last 6 months? 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  DIAGNOSTIC FINDINGS:  Mild levoscoliosis of lumbar spine is noted. Minimal grade 1  retrolisthesis of L1-2 is noted secondary to severe degenerative  disc disease at this level. Mild grade 1 anterolisthesis of L4-5 is  noted secondary to posterior facet joint hypertrophy. Moderate  degenerative disc disease is noted at L5-S1.   PATIENT SURVEYS:  FOTO Primary score: 68% risk adjusted 53 goal 70    COGNITION: Overall cognitive status: Within functional limits for tasks assessed     SENSATION: WFL  MUSCLE LENGTH: Hamstrings: sitting wfl   POSTURE:  left higher than right Pt wearing right shoe orthotic due to pronation  PALPATION: Slight TTP bilat hip about bursa area  LUMBAR ROM:  WFL  LOWER EXTREMITY STRENGTH     MMT Right eval Left eval  Hip flexion 48.1 45.0  Hip extension    Hip abduction 24.8 25.5  Hip adduction    Hip internal rotation    Hip external rotation    Knee flexion    Knee extension 45.7 46.6  Ankle dorsiflexion    Ankle plantarflexion    Ankle inversion    Ankle eversion     (Blank rows = not tested)  LOWER EXTREMITY MMT:    MMT Right eval Left eval  Hip flexion    Hip extension    Hip abduction    Hip adduction    Hip internal rotation    Hip external rotation    Knee flexion    Knee extension    Ankle dorsiflexion    Ankle plantarflexion    Ankle inversion    Ankle eversion     (Blank  rows = not tested)    FUNCTIONAL TESTS:  5 times sit to stand: 13.72 Timed up and go (TUG): 9.77   4 stage balance: passed GAIT: Distance walked: 500 ft Assistive device utilized: None Level of assistance: Complete Independence Comments: wfl  TODAY'S TREATMENT:  01/08/23: Pt seen for aquatic therapy today.  Treatment took place in water 3.5-4.75 ft in depth at the [facility] pool. Temp of water was 91.  Pt entered/exited the pool via stairs independently with bilat rail.   * walking forward backward with reciprocal arm swing - multiple laps  * side stepping with rainbow arm addct/ abdct  * straddling noodle: cycling multiple laps with breast stroke arms, varying speed, cross country ski, suspended jumping jacks * superman with UE on yellow noodle x 24s ;  to/from plank with UE on yellow noodle x 10 * seated balance on yellow noodle (like swing) with pelvic tilts L/R * attempted tall kneeling on noodle withUE on wall - unable to attain position * side plank with hand on bench in water -> star fish x 8 each side, cues for alignment * Single leg supermans x 10 each, not touching down between reps * SLS with opp arm resisted circles and swings holding solid noodle x 30s x 2 each  * 3 way LE stretch with ankle on solid noodle -> quad stretch    12/28/22: Pt seen for aquatic therapy today.  Treatment took place in water 3.5-4.75 ft in depth at the [facility] pool. Temp of water was 91.  Pt entered/exited the pool via stairs independently with bilat rail.   * straddling noodle: cycling multiple laps with breast stroke arms, varying speed, cross country ski, suspended jumping jacks * superman to/from plank with UE on yellow noodle x 10 * seated balance on yellow noodle (like swing) with hands out of water, cues to avoid rib flare * side plank  with hand on bench in water -> star fish x 5 each side, cues for alignment * walking forward/ backward multiple laps with cues for reciprocal arm swing, with light resistance bells * side stepping with light resistance bells and arm addct/ abdct  * staggered stance with reciprocal arm swing (10 slow, 10 fast, each leg forward) -> with horiz abdct /addct (10 slow, 10 fast, each leg forward  * UE on yellow hand floats, pink fins donned- 3 way LE kicks x 10 each, then hip abdct/ addct crossing midline x 10 each LE * 3 way LE stretch with ankle on solid noodle -> quad stretch    Treatment                            12/16:  Hooklying ab set for rib flare reduction Progression to hundreds- UE only, +EL 90, +head lift Qped ab set Prone STM to mid-thoracic paraspinals Seated rib cage depression and breathing into post rib cage.    PATIENT EDUCATION:  Education details: Intro to aquatic therapy  Person educated: Patient Education method: Explanation Education comprehension: verbalized understanding  HOME EXERCISE PROGRAM: TBA  ASSESSMENT:  CLINICAL IMPRESSION: Positive response to aquatic exercises; pt reports immediate reduction in pain when exercising in the water.  She did have some "grabbing/ catching" in L hip with cycling initially as well as LE stretch with ankle on noodle. Plan to create aquatic HEP for her and issue next session.    Will work on incorporating rib mobility into pilates and regular posture during land based sessions.    OBJECTIVE IMPAIRMENTS: decreased knowledge of condition, decreased strength, and pain.    PARTICIPATION LIMITATIONS: driving    REHAB POTENTIAL: Good  CLINICAL DECISION MAKING: Evolving/moderate complexity  EVALUATION COMPLEXITY: Moderate   GOALS: Goals reviewed with patient?  Yes  SHORT TERM GOALS: Target date: 12/27/22  Pt will tolerate full aquatic sessions consistently without increase in pain and with improving function to  demonstrate good toleration and effectiveness of intervention.  Baseline: Goal status: Met 12/14/22  2.  Pt will be indep with aquatic HEP and have decided upon value of intervention to gain pool access. Baseline:  Goal status: In progress - 12/22/22  3.  Pt will report decrease of max pain to </= to 5/10 for improved daily function Baseline: 8/10 max,  daily=5/10 Goal status: In Progress -12/22/22    LONG TERM GOALS: Target date: 01/21/22  Pt to meet stated Foto Goal 70 Baseline: 53 Goal status: INITIAL  2.  Pt will be indep with final HEP's (land and aquatic as appropriate) for continued management of condition Baseline:  Goal status: INITIAL  3.  Pt will improve hip abd strength  by 10 lbs to demonstrate improved overall physical function Baseline: See chart  Goal status: INITIAL  4.  Pt will report a decrease in normal pain to </=3/10 for improved functional ability/toleration Baseline: 5/10 Goal status: INITIAL  5.  Pt will report return to daily (weekly) exercise regimen for improved overall health and well being  Baseline:  Goal status: INITIAL    PLAN:  PT FREQUENCY: 1-2x/week  PT DURATION: 8 weeks  PLANNED INTERVENTIONS: 97164- PT Re-evaluation, 97110-Therapeutic exercises, 97530- Therapeutic activity, 97112- Neuromuscular re-education, 97535- Self Care, 69629- Manual therapy, [phone]- Gait training, [phone]- Orthotic Fit/training, [phone]- Aquatic Therapy, [phone]- Electrical stimulation (unattended), Patient/Family education, Balance training, Stair training, Taping, Dry Needling, Joint mobilization, Spinal manipulation, DME instructions, Cryotherapy, and Moist heat.  PLAN FOR NEXT SESSION: Land and aquatic: core and le strengthening, Pt instruction/edu on position and posture, management of condition.  HEP  Mayer Camel, [place] 01/08/23 12:11 PM [facility] [address] [place], [place], [address] Phone:  [phone]   Fax:  [phone]

## 2023-01-11 ENCOUNTER — Ambulatory Visit (HOSPITAL_BASED_OUTPATIENT_CLINIC_OR_DEPARTMENT_OTHER): Payer: BC Managed Care – PPO | Attending: Sports Medicine | Admitting: Physical Therapy

## 2023-01-11 ENCOUNTER — Encounter (HOSPITAL_BASED_OUTPATIENT_CLINIC_OR_DEPARTMENT_OTHER): Payer: Self-pay | Admitting: Physical Therapy

## 2023-01-11 DIAGNOSIS — M6281 Muscle weakness (generalized): Secondary | ICD-10-CM | POA: Insufficient documentation

## 2023-01-11 DIAGNOSIS — R293 Abnormal posture: Secondary | ICD-10-CM | POA: Diagnosis not present

## 2023-01-11 DIAGNOSIS — M5416 Radiculopathy, lumbar region: Secondary | ICD-10-CM | POA: Insufficient documentation

## 2023-01-11 DIAGNOSIS — M5459 Other low back pain: Secondary | ICD-10-CM | POA: Insufficient documentation

## 2023-01-11 DIAGNOSIS — M25552 Pain in left hip: Secondary | ICD-10-CM | POA: Insufficient documentation

## 2023-01-11 NOTE — Therapy (Signed)
 OUTPATIENT PHYSICAL THERAPY THORACOLUMBAR TREATMENT   Patient Name: ORELLA CUSHMAN MRN: [id number] DOB:15-Jun-1960, 63 y.o., female Today's Date: 01/12/2023  END OF SESSION:  PT End of Session - 01/11/23 1433     Visit Number 8    Number of Visits 12    Date for PT Re-Evaluation 01/22/23    Authorization Type [employer]    Authorization Time Period 12/3-1/31    Authorization - Visit Number 2    Authorization - Number of Visits 5    PT Start Time 1431    PT Stop Time 1514    PT Time Calculation (min) 43 min    Activity Tolerance Patient tolerated treatment well    Behavior During Therapy WFL for tasks assessed/performed              Past Medical History:  Diagnosis Date   Fibromyalgia    H/O fatigue    Insomnia    PONV (postoperative nausea and vomiting)    severe   Past Surgical History:  Procedure Laterality Date   CESAREAN SECTION  1995   CESAREAN SECTION  2000   DILATATION & CURETTAGE/HYSTEROSCOPY WITH TRUECLEAR N/A 01/01/2013   Procedure: DILATATION & CURETTAGE/HYSTEROSCOPY WITH TRUCLEAR;  Surgeon: Charlie CHRISTELLA Croak, MD;  Location: [facility];  Service: Gynecology;  Laterality: N/A;   SHOULDER SURGERY  05/03/2010   Left shoulder surgery, Dr.Doldorf     TMJ ARTHROPLASTY  1990   Patient Active Problem List   Diagnosis Date Noted   Aortic root enlargement (HCC) 12/14/2022   Somatic dysfunction of spine, sacral 10/17/2022   Cervical disc disorder with radiculopathy of cervical region 04/27/2021   Lumbar radiculopathy 04/27/2021   Dyslipidemia 03/02/2021   Fracture of one rib of left side with delayed healing 02/19/2020   Lateral pain of left hip 07/15/2019   Precordial chest pain 01/31/2019   Palpitation 01/30/2019   Educated about COVID-19 virus infection 01/30/2019   Urinary incontinence 01/20/2019   Loss of transverse plantar arch of right foot 07/02/2018   Peroneal tendinitis, right 07/02/2018   Morton neuroma, right 07/02/2018   Sinusitis, acute 04/21/2015    Cervical radiculitis 09/03/2014   Left arm pain 09/02/2014   Strain of right tibialis anterior muscle 06/24/2014   Tendonitis of left rotator cuff 06/24/2014   Fibromyalgia 06/10/2014   Varicose veins of bilateral lower extremities with other complications 01/28/2013   Leg edema, right 01/28/2013   Dizziness and giddiness 12/25/2012    PCP:   Harvey Seltzer, MD    REFERRING PROVIDER: Claudene Arthea CHRISTELLA, DO /   Harvey Seltzer, MD    REFERRING DIAG:  Scoliosis of lumbar spine, unspecified scoliosis type  M51.369 (ICD-10-CM) - Degeneration of intervertebral disc of lumbar region without discogenic back pain or lower extremity pain    Lumbar radiculopathy  M25.552 (ICD-10-CM) - Lateral pain of left hip    Rationale for Evaluation and Treatment: Rehabilitation  THERAPY DIAG:  Other low back pain  Muscle weakness (generalized)  Abnormal posture  ONSET DATE: increasing over then last year  SUBJECTIVE:  SUBJECTIVE STATEMENT: Pain level continues to say where it has been but I still feel that driving is the worst part.   POOL ACCESS:  Pt plans to join [employer] at d/c.   PERTINENT HISTORY:  -Foot pain on the right and symptoms consistent with a -Morton's neuroma  -bilateral hip pain that hurts in her groin/ MRIs which showed some labral pathology in the left hip and some mild arthritis of both hip joints -significant scoliosis with a convex curve    PAIN:  Are you having pain? Yes: NPRS scale: Current 7/10- Rt hip/knee,  L hip 5/10  Pain location: see above Pain description: constant in left lumbar spine and hip Aggravating factors: walking; standing, pilates  Relieving factors: nothing  PRECAUTIONS: None  RED FLAGS: None   WEIGHT BEARING RESTRICTIONS: No  FALLS:  Has patient fallen  in last 6 months? 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  DIAGNOSTIC FINDINGS:  Mild levoscoliosis of lumbar spine is noted. Minimal grade 1  retrolisthesis of L1-2 is noted secondary to severe degenerative  disc disease at this level. Mild grade 1 anterolisthesis of L4-5 is  noted secondary to posterior facet joint hypertrophy. Moderate  degenerative disc disease is noted at L5-S1.   PATIENT SURVEYS:  FOTO Primary score: 68% risk adjusted 53 goal 70 FOTO 01/11/23: 56%    COGNITION: Overall cognitive status: Within functional limits for tasks assessed     SENSATION: WFL  MUSCLE LENGTH: Hamstrings: sitting wfl   POSTURE:  left higher than right Pt wearing right shoe orthotic due to pronation  PALPATION: Slight TTP bilat hip about bursa area  LUMBAR ROM:  WFL  LOWER EXTREMITY STRENGTH     MMT Right eval Left eval Rt/Lt  1/2  Hip flexion 48.1 45.0   Hip extension     Hip abduction 24.8 25.5 37.2/40.1  Hip adduction     Hip internal rotation     Hip external rotation     Knee flexion     Knee extension 45.7 46.6   Ankle dorsiflexion     Ankle plantarflexion     Ankle inversion     Ankle eversion      (Blank rows = not tested)     FUNCTIONAL TESTS:  5 times sit to stand: 13.72 Timed up and go (TUG): 9.77   4 stage balance: passed GAIT: Distance walked: 500 ft Assistive device utilized: None Level of assistance: Complete Independence Comments: wfl  TODAY'S TREATMENT:  Treatment                             1/2:  Hooklying progression: ab set, heel slides, bicycle from TT Qped bird dog with foot slide Qped hip hike- Lt knee on airex Tall kneel glut + ab set; + lean back; bridge; bridge + OH reach Sidelying: oblique engagement + clam, + hip abd, +hip abd with flexion/ext; side plank elbow/knee SLS- +alt pull fwd green tband post anchor   01/08/23: Pt seen for aquatic therapy today.  Treatment took place in water 3.5-4.75 ft in depth at the [facility] pool. Temp of water was 91.  Pt entered/exited the pool via stairs independently with bilat rail.   * walking forward backward with reciprocal arm swing - multiple laps  * side stepping with rainbow arm addct/ abdct  * straddling noodle: cycling multiple laps with breast stroke arms, varying speed, cross country ski, suspended jumping jacks * superman with UE on yellow noodle x 24s ;  to/from plank with UE on yellow noodle x 10 * seated balance on yellow noodle (like swing) with pelvic tilts L/R * attempted tall kneeling on noodle withUE on wall - unable to attain position * side plank with hand on bench in water -> star fish x 8 each side, cues for alignment * Single leg supermans x 10 each, not touching down between reps * SLS with opp arm resisted circles and swings holding solid noodle x 30s x 2 each  * 3 way LE stretch with ankle on solid noodle -> quad stretch    12/28/22: Pt seen for aquatic therapy today.  Treatment took place in water 3.5-4.75 ft in depth at the [facility] pool. Temp of water was 91.  Pt entered/exited the pool via stairs independently with bilat rail.   * straddling noodle: cycling multiple laps with breast stroke arms, varying speed, cross country ski, suspended jumping jacks * superman to/from plank with UE on yellow noodle x 10 * seated balance on yellow noodle (like swing) with hands out of water, cues to avoid rib flare * side plank with hand on bench in water ->  star fish x 5 each side, cues for alignment * walking forward/ backward multiple laps with cues for reciprocal arm swing, with light resistance bells * side stepping with light resistance bells and arm addct/ abdct  * staggered stance with reciprocal arm swing (10 slow, 10 fast, each leg forward) -> with horiz abdct /addct (10 slow, 10 fast, each leg forward  * UE on yellow hand floats, pink fins donned- 3 way LE kicks x 10 each, then hip abdct/ addct crossing midline x 10 each LE * 3 way LE stretch with ankle on solid noodle -> quad stretch    Treatment                            12/16:  Hooklying ab set for rib flare reduction Progression to hundreds- UE only, +EL 90, +head lift Qped ab set Prone STM to mid-thoracic paraspinals Seated rib cage depression and breathing into post rib cage.    PATIENT EDUCATION:  Education details: Intro to aquatic therapy  Person educated: Patient Education method: Explanation Education comprehension: verbalized understanding  HOME EXERCISE PROGRAM: Rib flare reduction in pilates [id number]  ASSESSMENT:  CLINICAL IMPRESSION: Patient has made improvement in objective measurements regarding strength mobility and performance  of form and home exercises.  She denies any changes in pain levels and feels that her function continues to be at the same level.  Discussion was held regarding lag time behind functional improvement versus objective measure strength.  She has 3 more visits allowed in her current authorization and will need to be resubmitted for insurance extension at that time.  Will continue to benefit from skilled physical therapy to continue gains and progress functional ability to meet long-term goals.   OBJECTIVE IMPAIRMENTS: decreased knowledge of condition, decreased strength, and pain.    PARTICIPATION LIMITATIONS: driving    REHAB POTENTIAL: Good  CLINICAL DECISION MAKING: Evolving/moderate complexity  EVALUATION COMPLEXITY:  Moderate   GOALS: Goals reviewed with patient? Yes  SHORT TERM GOALS: Target date: 12/27/22  Pt will tolerate full aquatic sessions consistently without increase in pain and with improving function to demonstrate good toleration and effectiveness of intervention.  Baseline: Goal status: Met 12/14/22  2.  Pt will be indep with aquatic HEP and have decided upon value of intervention to gain pool access. Baseline:  Goal status: In progress - 12/22/22  3.  Pt will report decrease of max pain to </= to 5/10 for improved daily function Baseline: 8/10 max,  daily=5/10 Goal status: In Progress -12/22/22    LONG TERM GOALS: Target date: 01/21/22  Pt to meet stated Foto Goal 70 Baseline: 53 Goal status: ongoing  2.  Pt will be indep with final HEP's (land and aquatic as appropriate) for continued management of condition Baseline:  Goal status: ongoing  3.  Pt will improve hip abd strength  by 10 lbs to demonstrate improved overall physical function Baseline: See chart  Goal status: achieved  4.  Pt will report a decrease in normal pain to </=3/10 for improved functional ability/toleration Baseline: 5/10, never below a 5 Goal status: ongoing  5.  Pt will report return to daily (weekly) exercise regimen for improved overall health and well being  Baseline:  Goal status: INITIAL    PLAN:  PT FREQUENCY: 1-2x/week  PT DURATION: POC date  PLANNED INTERVENTIONS: 97164- PT Re-evaluation, 97110-Therapeutic exercises, 97530- Therapeutic activity, 97112- Neuromuscular re-education, 97535- Self Care, 02859- Manual therapy, [phone]- Gait training, [phone]- Orthotic Fit/training, [phone]- Aquatic Therapy, [phone]- Electrical stimulation (unattended), Patient/Family education, Balance training, Stair training, Taping, Dry Needling, Joint mobilization, Spinal manipulation, DME instructions, Cryotherapy, and Moist heat.  PLAN FOR NEXT SESSION: Land and aquatic: core and le strengthening, Pt  instruction/edu on position and posture, management of condition.   Patric Buckhalter C. Kennah Hehr PT, DPT 01/12/23 9:00 AM  [facility] [address] [place], [place], [address] Phone: [phone]   Fax:  [phone]

## 2023-01-15 ENCOUNTER — Encounter (HOSPITAL_BASED_OUTPATIENT_CLINIC_OR_DEPARTMENT_OTHER): Payer: Self-pay | Admitting: Physical Therapy

## 2023-01-15 ENCOUNTER — Ambulatory Visit (HOSPITAL_BASED_OUTPATIENT_CLINIC_OR_DEPARTMENT_OTHER): Payer: BC Managed Care – PPO | Admitting: Physical Therapy

## 2023-01-15 DIAGNOSIS — M5416 Radiculopathy, lumbar region: Secondary | ICD-10-CM | POA: Diagnosis not present

## 2023-01-15 DIAGNOSIS — M25552 Pain in left hip: Secondary | ICD-10-CM | POA: Diagnosis not present

## 2023-01-15 DIAGNOSIS — M5459 Other low back pain: Secondary | ICD-10-CM | POA: Diagnosis not present

## 2023-01-15 DIAGNOSIS — M6281 Muscle weakness (generalized): Secondary | ICD-10-CM

## 2023-01-15 DIAGNOSIS — R293 Abnormal posture: Secondary | ICD-10-CM | POA: Diagnosis not present

## 2023-01-15 NOTE — Therapy (Signed)
 OUTPATIENT PHYSICAL THERAPY THORACOLUMBAR TREATMENT   Patient Name: Patricia Lloyd MRN: [id number] DOB:[birth year], 63 y.o., female Today's Date: 01/15/2023  END OF SESSION:  PT End of Session - 01/15/23 1034     Visit Number 9    Number of Visits 12    Date for PT Re-Evaluation 01/22/23    Authorization Type [employer]    Authorization Time Period 12/3-1/31    Authorization - Visit Number 3    Authorization - Number of Visits 5    PT Start Time 1034    PT Stop Time 1115    PT Time Calculation (min) 41 min    Activity Tolerance Patient tolerated treatment well    Behavior During Therapy WFL for tasks assessed/performed              Past Medical History:  Diagnosis Date   Fibromyalgia    H/O fatigue    Insomnia    PONV (postoperative nausea and vomiting)    severe   Past Surgical History:  Procedure Laterality Date   CESAREAN SECTION  1995   CESAREAN SECTION  2000   DILATATION & CURETTAGE/HYSTEROSCOPY WITH TRUECLEAR N/A 01/01/2013   Procedure: DILATATION & CURETTAGE/HYSTEROSCOPY WITH TRUCLEAR;  Surgeon: Charlie CHRISTELLA Croak, MD;  Location: [facility];  Service: Gynecology;  Laterality: N/A;   SHOULDER SURGERY  05/03/2010   Left shoulder surgery, Dr.Doldorf     TMJ ARTHROPLASTY  1990   Patient Active Problem List   Diagnosis Date Noted   Aortic root enlargement (HCC) 12/14/2022   Somatic dysfunction of spine, sacral 10/17/2022   Cervical disc disorder with radiculopathy of cervical region 04/27/2021   Lumbar radiculopathy 04/27/2021   Dyslipidemia 03/02/2021   Fracture of one rib of left side with delayed healing 02/19/2020   Lateral pain of left hip 07/15/2019   Precordial chest pain 01/31/2019   Palpitation 01/30/2019   Educated about COVID-19 virus infection 01/30/2019   Urinary incontinence 01/20/2019   Loss of transverse plantar arch of right foot 07/02/2018   Peroneal tendinitis, right 07/02/2018   Morton neuroma, right 07/02/2018   Sinusitis, acute 04/21/2015    Cervical radiculitis 09/03/2014   Left arm pain 09/02/2014   Strain of right tibialis anterior muscle 06/24/2014   Tendonitis of left rotator cuff 06/24/2014   Fibromyalgia 06/10/2014   Varicose veins of bilateral lower extremities with other complications 01/28/2013   Leg edema, right 01/28/2013   Dizziness and giddiness 12/25/2012    PCP:   Harvey Seltzer, MD    REFERRING PROVIDER: Claudene Arthea CHRISTELLA, DO /   Harvey Seltzer, MD    REFERRING DIAG:  Scoliosis of lumbar spine, unspecified scoliosis type  M51.369 (ICD-10-CM) - Degeneration of intervertebral disc of lumbar region without discogenic back pain or lower extremity pain    Lumbar radiculopathy  M25.552 (ICD-10-CM) - Lateral pain of left hip    Rationale for Evaluation and Treatment: Rehabilitation  THERAPY DIAG:  Other low back pain  Muscle weakness (generalized)  Abnormal posture  ONSET DATE: increasing over then last year  SUBJECTIVE:  SUBJECTIVE STATEMENT: Pt reports as long as she continues to have to drive distances she believes her back pain will persist.  POOL ACCESS:  Pt plans to join [employer] at d/c.   PERTINENT HISTORY:  -Foot pain on the right and symptoms consistent with a -Morton's neuroma  -bilateral hip pain that hurts in her groin/ MRIs which showed some labral pathology in the left hip and some mild arthritis of both hip joints -significant scoliosis with a convex curve    PAIN:  Are you having pain? Yes: NPRS scale: Current 6/10- Rt hip/knee,  L hip 5/10  Pain location: see above Pain description: constant in left lumbar spine and hip Aggravating factors: walking; standing, pilates  Relieving factors: nothing  PRECAUTIONS: None  RED FLAGS: None   WEIGHT BEARING RESTRICTIONS: No  FALLS:  Has  patient fallen in last 6 months? 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  DIAGNOSTIC FINDINGS:  Mild levoscoliosis of lumbar spine is noted. Minimal grade 1  retrolisthesis of L1-2 is noted secondary to severe degenerative  disc disease at this level. Mild grade 1 anterolisthesis of L4-5 is  noted secondary to posterior facet joint hypertrophy. Moderate  degenerative disc disease is noted at L5-S1.   PATIENT SURVEYS:  FOTO Primary score: 68% risk adjusted 53 goal 70 FOTO 01/11/23: 56%    COGNITION: Overall cognitive status: Within functional limits for tasks assessed     SENSATION: WFL  MUSCLE LENGTH: Hamstrings: sitting wfl   POSTURE:  left higher than right Pt wearing right shoe orthotic due to pronation  PALPATION: Slight TTP bilat hip about bursa area  LUMBAR ROM:  WFL  LOWER EXTREMITY STRENGTH     MMT Right eval Left eval Rt/Lt  1/2  Hip flexion 48.1 45.0   Hip extension     Hip abduction 24.8 25.5 37.2/40.1  Hip adduction     Hip internal rotation     Hip external rotation     Knee flexion     Knee extension 45.7 46.6   Ankle dorsiflexion     Ankle plantarflexion     Ankle inversion     Ankle eversion      (Blank rows = not tested)     FUNCTIONAL TESTS:  5 times sit to stand: 13.72 Timed up and go (TUG): 9.77   4 stage balance: passed GAIT: Distance walked: 500 ft Assistive device utilized: None Level of assistance: Complete Independence Comments: wfl  TODAY'S TREATMENT:     01/14/22  Pt seen for aquatic therapy today.  Treatment took place in water 3.5-4.75 ft in depth at the [facility] pool. Temp of water was 91.  Pt entered/exited the pool via stairs independently with bilat rail.    * walking forward backward with reciprocal arm swing - multiple laps  * side stepping with rainbow arm addct/ abdct  * Single leg supermans x 10 each, not touching down between reps *Plank with arm raises x 8; leg lifts x 8 *suspended sup using blue HB holding for 10s x3 for isometric abd strengthening. A few trials required for proper execution with vc and demonstration from deck.  - suspended prone caused LBP so discontinued. * straddling noodle: cycling multiple laps with breast stroke arms, varying speed, cross country ski, suspended jumping jacks * seated balance on yellow noodle (like swing) bilat ue elevated x 10 s - hands on knees x 20s.  - sitting balance supported above noodle x 3 holding x 10s.  Reverse plank x 1 trial with difficulty    Treatment                            1/2:  Hooklying progression: ab set, heel slides, bicycle from TT Qped bird dog with foot slide Qped hip hike- Lt knee on airex Tall kneel glut + ab set; + lean back; bridge; bridge + OH reach Sidelying: oblique engagement + clam, + hip abd, +hip abd with flexion/ext; side plank elbow/knee SLS- +alt pull fwd green tband post anchor   01/08/23: Pt seen for aquatic therapy today.  Treatment took place in water 3.5-4.75 ft in depth at the [facility] pool. Temp of water was 91.  Pt entered/exited the pool via stairs independently with bilat rail.   * walking forward backward with reciprocal arm swing - multiple laps  * side stepping with rainbow arm addct/ abdct  * straddling noodle: cycling multiple laps with breast stroke arms, varying speed, cross country ski, suspended jumping jacks * superman with UE on yellow noodle x 24s ;  to/from plank with UE on yellow noodle x 10 *  seated balance on yellow noodle (like swing) with pelvic tilts L/R * attempted tall kneeling on noodle withUE on wall - unable to attain position * side plank with hand on bench in water -> star fish x 8 each side, cues for alignment * Single leg supermans x 10 each, not touching down between reps * SLS with opp arm resisted circles and swings holding solid noodle x 30s x 2 each  * 3 way LE stretch with ankle on solid noodle -> quad stretch    12/28/22: Pt seen for aquatic therapy today.  Treatment took place in water 3.5-4.75 ft in depth at the [facility] pool. Temp of water was 91.  Pt entered/exited the pool via stairs independently with bilat rail.   * straddling noodle: cycling multiple laps with breast stroke arms, varying speed, cross country ski, suspended jumping jacks * superman to/from plank with UE on yellow noodle x 10 * seated balance on yellow noodle (like swing) with hands out of water, cues to avoid rib flare * side plank with hand on bench in water -> star fish x 5 each side, cues for alignment * walking forward/ backward multiple laps with cues for reciprocal arm swing, with light resistance bells * side stepping with light resistance bells and arm addct/ abdct  * staggered stance with reciprocal arm  swing (10 slow, 10 fast, each leg forward) -> with horiz abdct /addct (10 slow, 10 fast, each leg forward  * UE on yellow hand floats, pink fins donned- 3 way LE kicks x 10 each, then hip abdct/ addct crossing midline x 10 each LE * 3 way LE stretch with ankle on solid noodle -> quad stretch    Treatment                            12/16:  Hooklying ab set for rib flare reduction Progression to hundreds- UE only, +EL 90, +head lift Qped ab set Prone STM to mid-thoracic paraspinals Seated rib cage depression and breathing into post rib cage.    PATIENT EDUCATION:  Education details: Intro to aquatic therapy  Person educated: Patient Education method:  Explanation Education comprehension: verbalized understanding  HOME EXERCISE PROGRAM: Rib flare reduction in pilates [id number]  ASSESSMENT:  CLINICAL IMPRESSION: Progressed pt to high level core strengthening with improving core control/strength completing forward plank exercises and sitting balance with core engagement well.  Goals ongoing.  Probable discontinuation of aquatic intervention after next session.  Pt declining aquatic HEP but will further discuss next session. Her intention is to join [employer] and participate in [facility]  water aerobic classes.   Previous Impression. Patient has made improvement in objective measurements regarding strength mobility and performance of form and home exercises.  She denies any changes in pain levels and feels that her function continues to be at the same level.  Discussion was held regarding lag time behind functional improvement versus objective measure strength.  She has 3 more visits allowed in her current authorization and will need to be resubmitted for insurance extension at that time.  Will continue to benefit from skilled physical therapy to continue gains and progress functional ability to meet long-term goals.   OBJECTIVE IMPAIRMENTS: decreased knowledge of condition, decreased strength, and pain.    PARTICIPATION LIMITATIONS: driving    REHAB POTENTIAL: Good  CLINICAL DECISION MAKING: Evolving/moderate complexity  EVALUATION COMPLEXITY: Moderate   GOALS: Goals reviewed with patient? Yes  SHORT TERM GOALS: Target date: 12/27/22  Pt will tolerate full aquatic sessions consistently without increase in pain and with improving function to demonstrate good toleration and effectiveness of intervention.  Baseline: Goal status: Met 12/14/22  2.  Pt will be indep with aquatic HEP and have decided upon value of intervention to gain pool access. Baseline:  Goal status: In progress - 12/22/22  3.  Pt will report decrease  of max pain to </= to 5/10 for improved daily function Baseline: 8/10 max,  daily=5/10 Goal status: In Progress -12/22/22    LONG TERM GOALS: Target date: 01/21/22  Pt to meet stated Foto Goal 70 Baseline: 53 Goal status: ongoing  2.  Pt will be indep with final HEP's (land and aquatic as appropriate) for continued management of condition Baseline:  Goal status: ongoing  3.  Pt will improve hip abd strength  by 10 lbs to demonstrate improved overall physical function Baseline: See chart  Goal status: achieved  4.  Pt will report a decrease in normal pain to </=3/10 for improved functional ability/toleration Baseline: 5/10, never below a 5 Goal status: ongoing  5.  Pt will report return to daily (weekly) exercise regimen for improved overall health and well being  Baseline:  Goal status: INITIAL    PLAN:  PT FREQUENCY: 1-2x/week  PT DURATION: POC date  PLANNED INTERVENTIONS: 97164- PT Re-evaluation, 97110-Therapeutic exercises, 97530- Therapeutic activity, [id number]- Neuromuscular re-education, [phone]- Self Care, [id number]- Manual therapy, [phone]- Gait training, [phone]- Orthotic Fit/training, [phone]- Aquatic Therapy, [phone]- Electrical stimulation (unattended), Patient/Family education, Balance training, Stair training, Taping, Dry Needling, Joint mobilization, Spinal manipulation, DME instructions, Cryotherapy, and Moist heat.  PLAN FOR NEXT SESSION: Land and aquatic: core and le strengthening, Pt instruction/edu on position and posture, management of condition.   Jessica C. Hightower PT, DPT 01/15/23 10:41 AM  [facility] [address] [place], [place], [address] Phone: [phone]   Fax:  [phone]

## 2023-01-18 ENCOUNTER — Encounter (HOSPITAL_BASED_OUTPATIENT_CLINIC_OR_DEPARTMENT_OTHER): Payer: Self-pay | Admitting: Physical Therapy

## 2023-01-18 ENCOUNTER — Ambulatory Visit (HOSPITAL_BASED_OUTPATIENT_CLINIC_OR_DEPARTMENT_OTHER): Payer: BC Managed Care – PPO | Admitting: Physical Therapy

## 2023-01-18 DIAGNOSIS — M5459 Other low back pain: Secondary | ICD-10-CM

## 2023-01-18 DIAGNOSIS — R293 Abnormal posture: Secondary | ICD-10-CM

## 2023-01-18 DIAGNOSIS — M6281 Muscle weakness (generalized): Secondary | ICD-10-CM | POA: Diagnosis not present

## 2023-01-18 DIAGNOSIS — M25552 Pain in left hip: Secondary | ICD-10-CM | POA: Diagnosis not present

## 2023-01-18 DIAGNOSIS — M5416 Radiculopathy, lumbar region: Secondary | ICD-10-CM | POA: Diagnosis not present

## 2023-01-18 NOTE — Patient Instructions (Signed)

## 2023-01-18 NOTE — Therapy (Signed)
 OUTPATIENT PHYSICAL THERAPY THORACOLUMBAR TREATMENT   Patient Name: Patricia Lloyd MRN: [id number] DOB:25-May-1960, 63 y.o., female Today's Date: 01/18/2023  END OF SESSION:  PT End of Session - 01/18/23 1526     Visit Number 10    Number of Visits 12    Date for PT Re-Evaluation 01/22/23    Authorization Type BCBS    Authorization Time Period 12/3-1/31    Authorization - Visit Number 4    Authorization - Number of Visits 5    PT Start Time 1525    PT Stop Time 1558    PT Time Calculation (min) 33 min    Activity Tolerance Patient tolerated treatment well    Behavior During Therapy WFL for tasks assessed/performed               Past Medical History:  Diagnosis Date   Fibromyalgia    H/O fatigue    Insomnia    PONV (postoperative nausea and vomiting)    severe   Past Surgical History:  Procedure Laterality Date   CESAREAN SECTION  1995   CESAREAN SECTION  2000   DILATATION & CURETTAGE/HYSTEROSCOPY WITH TRUECLEAR N/A 01/01/2013   Procedure: DILATATION & CURETTAGE/HYSTEROSCOPY WITH TRUCLEAR;  Surgeon: Charlie CHRISTELLA Croak, MD;  Location: [facility];  Service: Gynecology;  Laterality: N/A;   SHOULDER SURGERY  05/03/2010   Left shoulder surgery, Dr.Doldorf     TMJ ARTHROPLASTY  1990   Patient Active Problem List   Diagnosis Date Noted   Aortic root enlargement (HCC) 12/14/2022   Somatic dysfunction of spine, sacral 10/17/2022   Cervical disc disorder with radiculopathy of cervical region 04/27/2021   Lumbar radiculopathy 04/27/2021   Dyslipidemia 03/02/2021   Fracture of one rib of left side with delayed healing 02/19/2020   Lateral pain of left hip 07/15/2019   Precordial chest pain 01/31/2019   Palpitation 01/30/2019   Educated about COVID-19 virus infection 01/30/2019   Urinary incontinence 01/20/2019   Loss of transverse plantar arch of right foot 07/02/2018   Peroneal tendinitis, right 07/02/2018   Morton neuroma, right 07/02/2018   Sinusitis, acute  04/21/2015   Cervical radiculitis 09/03/2014   Left arm pain 09/02/2014   Strain of right tibialis anterior muscle 06/24/2014   Tendonitis of left rotator cuff 06/24/2014   Fibromyalgia 06/10/2014   Varicose veins of bilateral lower extremities with other complications 01/28/2013   Leg edema, right 01/28/2013   Dizziness and giddiness 12/25/2012    PCP:   Harvey Seltzer, MD    REFERRING PROVIDER: Claudene Arthea CHRISTELLA, DO /   Harvey Seltzer, MD    REFERRING DIAG:  Scoliosis of lumbar spine, unspecified scoliosis type  M51.369 (ICD-10-CM) - Degeneration of intervertebral disc of lumbar region without discogenic back pain or lower extremity pain    Lumbar radiculopathy  M25.552 (ICD-10-CM) - Lateral pain of left hip    Rationale for Evaluation and Treatment: Rehabilitation  THERAPY DIAG:  Other low back pain  Muscle weakness (generalized)  Abnormal posture  ONSET DATE: increasing over then last year  SUBJECTIVE:  SUBJECTIVE STATEMENT: Did an exercise with arms draped back over the noodle and lifted my legs but severely hurt my neck and thoracic region afterward. Denies numbness or tingling in UEs.   POOL ACCESS:  Pt plans to join [employer] at d/c.   PERTINENT HISTORY:  -Foot pain on the right and symptoms consistent with a -Morton's neuroma  -bilateral hip pain that hurts in her groin/ MRIs which showed some labral pathology in the left hip and some mild arthritis of both hip joints -significant scoliosis with a convex curve    PAIN:  Are you having pain? Yes: NPRS scale: Current 6/10- Rt hip/knee,  L hip 5/10  Pain location: see above Pain description: constant in left lumbar spine and hip Aggravating factors: walking; standing, pilates  Relieving factors: nothing  PRECAUTIONS:  None  RED FLAGS: None   WEIGHT BEARING RESTRICTIONS: No  FALLS:  Has patient fallen in last 6 months? 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  DIAGNOSTIC FINDINGS:  Mild levoscoliosis of lumbar spine is noted. Minimal grade 1  retrolisthesis of L1-2 is noted secondary to severe degenerative  disc disease at this level. Mild grade 1 anterolisthesis of L4-5 is  noted secondary to posterior facet joint hypertrophy. Moderate  degenerative disc disease is noted at L5-S1.   PATIENT SURVEYS:  FOTO Primary score: 68% risk adjusted 53 goal 70 FOTO 01/11/23: 56%    COGNITION: Overall cognitive status: Within functional limits for tasks assessed     SENSATION: WFL  MUSCLE LENGTH: Hamstrings: sitting wfl   POSTURE:  left higher than right Pt wearing right shoe orthotic due to pronation  PALPATION: Slight TTP bilat hip about bursa area  LUMBAR ROM:  WFL  LOWER EXTREMITY STRENGTH     MMT Right eval Left eval Rt/Lt  1/2  Hip flexion 48.1 45.0   Hip extension     Hip abduction 24.8 25.5 37.2/40.1  Hip adduction     Hip internal rotation     Hip external rotation     Knee flexion     Knee extension 45.7 46.6   Ankle dorsiflexion     Ankle plantarflexion     Ankle inversion     Ankle eversion      (Blank rows = not tested)     FUNCTIONAL TESTS:  5 times sit to stand: 13.72 Timed up and go (TUG): 9.77   4 stage balance: passed GAIT: Distance walked: 500 ft Assistive device utilized: None Level of assistance: Complete Independence Comments: wfl  TODAY'S TREATMENT:     Treatment                             01/18/23:  Trigger Point Dry Needling  Initial Treatment: Pt instructed on Dry Needling rational, procedures, and possible side effects. Pt instructed to expect mild to moderate muscle soreness later in the day and/or into the next day.  Pt instructed in methods to reduce muscle soreness. Pt instructed to continue prescribed HEP. Because Dry Needling was performed over or adjacent to a lung field, pt was educated on S/S of pneumothorax and to seek immediate medical attention should  they occur.  Patient was educated on signs and symptoms of infection and other risk factors and advised to seek medical attention should they occur.  Patient verbalized understanding of these instructions and education.   Patient Verbal Consent Given: Yes Education Handout Provided: Yes Muscles Treated: bilateral upper trap & levator Electrical Stimulation Performed: No Treatment Response/Outcome: significant twitch with decreased concordant pain  STM suboccipitals Cervical traction Supine chin tuck Supine T pec stretch Discussed driving & pain management   01/14/22                                                                                                                          Pt seen for aquatic therapy today.  Treatment took place in water 3.5-4.75 ft in depth at the [facility] pool. Temp of water was 91.  Pt entered/exited the pool via stairs independently with bilat rail.    * walking forward backward with reciprocal arm swing - multiple laps  * side stepping with rainbow arm addct/ abdct  * Single leg supermans x 10 each, not touching down between reps *Plank with arm raises x 8; leg lifts x 8 *suspended sup using blue HB holding for 10s x3 for isometric abd strengthening. A few trials required for proper execution with vc and demonstration from deck.  - suspended prone caused LBP so discontinued. * straddling noodle: cycling multiple laps with breast stroke arms, varying speed, cross  country ski, suspended jumping jacks * seated balance on yellow noodle (like swing) bilat ue elevated x 10 s - hands on knees x 20s.  - sitting balance supported above noodle x 3 holding x 10s.  Reverse plank x 1 trial with difficulty    Treatment                            1/2:  Hooklying progression: ab set, heel slides, bicycle from TT Qped bird dog with foot slide Qped hip hike- Lt knee on airex Tall kneel glut + ab set; + lean back; bridge; bridge + OH reach Sidelying: oblique engagement + clam, + hip abd, +hip abd with flexion/ext; side plank elbow/knee SLS- +alt pull fwd green tband post anchor   01/08/23: Pt seen for aquatic therapy today.  Treatment took place in water 3.5-4.75 ft in depth at the [facility] pool. Temp of water was 91.  Pt entered/exited the pool via stairs independently with bilat rail.   * walking forward backward with reciprocal arm swing - multiple laps  * side stepping with rainbow arm addct/ abdct  * straddling noodle: cycling multiple laps with breast stroke arms, varying speed, cross country ski, suspended jumping jacks * superman with UE on yellow noodle x 24s ;  to/from plank with UE on yellow noodle x 10 * seated balance on yellow noodle (like swing) with pelvic tilts L/R * attempted tall kneeling on noodle withUE on wall - unable to attain position *  side plank with hand on bench in water -> star fish x 8 each side, cues for alignment * Single leg supermans x 10 each, not touching down between reps * SLS with opp arm resisted circles and swings holding solid noodle x 30s x 2 each  * 3 way LE stretch with ankle on solid noodle -> quad stretch    12/28/22: Pt seen for aquatic therapy today.  Treatment took place in water 3.5-4.75 ft in depth at the [facility] pool. Temp of water was 91.  Pt entered/exited the pool via stairs independently with bilat rail.   * straddling noodle: cycling multiple laps with breast stroke arms,  varying speed, cross country ski, suspended jumping jacks * superman to/from plank with UE on yellow noodle x 10 * seated balance on yellow noodle (like swing) with hands out of water, cues to avoid rib flare * side plank with hand on bench in water -> star fish x 5 each side, cues for alignment * walking forward/ backward multiple laps with cues for reciprocal arm swing, with light resistance bells * side stepping with light resistance bells and arm addct/ abdct  * staggered stance with reciprocal arm swing (10 slow, 10 fast, each leg forward) -> with horiz abdct /addct (10 slow, 10 fast, each leg forward  * UE on yellow hand floats, pink fins donned- 3 way LE kicks x 10 each, then hip abdct/ addct crossing midline x 10 each LE * 3 way LE stretch with ankle on solid noodle -> quad stretch    Treatment                            12/16:  Hooklying ab set for rib flare reduction Progression to hundreds- UE only, +EL 90, +head lift Qped ab set Prone STM to mid-thoracic paraspinals Seated rib cage depression and breathing into post rib cage.    PATIENT EDUCATION:  Education details: Intro to aquatic therapy  Person educated: Patient Education method: Explanation Education comprehension: verbalized understanding  HOME EXERCISE PROGRAM: Rib flare reduction in pilates [id number]  ASSESSMENT:  CLINICAL IMPRESSION: Significant spasm across shoulders and cervical region were creating limitations in movement. Able to decrease concordant pain with TPDN that resulted in soreness as expected and encouraged regular movement after leaving today.    Previous Impression. Patient has made improvement in objective measurements regarding strength mobility and performance of form and home exercises.  She denies any changes in pain levels and feels that her function continues to be at the same level.  Discussion was held regarding lag time behind functional improvement versus objective measure strength.   She has 3 more visits allowed in her current authorization and will need to be resubmitted for insurance extension at that time.  Will continue to benefit from skilled physical therapy to continue gains and progress functional ability to meet long-term goals.   OBJECTIVE IMPAIRMENTS: decreased knowledge of condition, decreased strength, and pain.    PARTICIPATION LIMITATIONS: driving    REHAB POTENTIAL: Good  CLINICAL DECISION MAKING: Evolving/moderate complexity  EVALUATION COMPLEXITY: Moderate   GOALS: Goals reviewed with patient? Yes  SHORT TERM GOALS: Target date: 12/27/22  Pt will tolerate full aquatic sessions consistently without increase in pain and with improving function to demonstrate good toleration and effectiveness of intervention.  Baseline: Goal status: Met 12/14/22  2.  Pt will be indep with aquatic HEP and have decided upon value of intervention to gain  pool access. Baseline:  Goal status: In progress - 12/22/22  3.  Pt will report decrease of max pain to </= to 5/10 for improved daily function Baseline: 8/10 max,  daily=5/10 Goal status: In Progress -12/22/22    LONG TERM GOALS: Target date: 01/21/22  Pt to meet stated Foto Goal 70 Baseline: 53 Goal status: ongoing  2.  Pt will be indep with final HEP's (land and aquatic as appropriate) for continued management of condition Baseline:  Goal status: ongoing  3.  Pt will improve hip abd strength  by 10 lbs to demonstrate improved overall physical function Baseline: See chart  Goal status: achieved  4.  Pt will report a decrease in normal pain to </=3/10 for improved functional ability/toleration Baseline: 5/10, never below a 5 Goal status: ongoing  5.  Pt will report return to daily (weekly) exercise regimen for improved overall health and well being  Baseline:  Goal status: INITIAL    PLAN:  PT FREQUENCY: 1-2x/week  PT DURATION: POC date  PLANNED INTERVENTIONS: 97164- PT Re-evaluation,  97110-Therapeutic exercises, 97530- Therapeutic activity, 97112- Neuromuscular re-education, 97535- Self Care, 02859- Manual therapy, [phone]- Gait training, [phone]- Orthotic Fit/training, [phone]- Aquatic Therapy, [phone]- Electrical stimulation (unattended), Patient/Family education, Balance training, Stair training, Taping, Dry Needling, Joint mobilization, Spinal manipulation, DME instructions, Cryotherapy, and Moist heat.  PLAN FOR NEXT SESSION: ERO   Antoinette Haskett C. Jaiyanna Safran PT, DPT 01/18/23 4:54 PM  [facility] [address] [place], [place], [address] Phone: [phone]   Fax:  [phone]

## 2023-01-22 ENCOUNTER — Ambulatory Visit (HOSPITAL_BASED_OUTPATIENT_CLINIC_OR_DEPARTMENT_OTHER): Payer: BC Managed Care – PPO | Admitting: Physical Therapy

## 2023-01-22 NOTE — Progress Notes (Signed)
 [facility] [address] [place] [address] Phone: [phone] Subjective:   Patricia Lloyd, am serving as a scribe for Dr. Arthea Lloyd.  I'm seeing this patient by the request  of:  Patricia Clotilda SAUNDERS, MD  CC: Multiple joints follow-up  YEP:Dlagzrupcz  Patricia Lloyd is a 63 y.o. female coming in with complaint of back and neck pain. OMT 11/28/2022. Patient states enjoying aquatic PT. Doing well. No new concerns.  Medications patient has been prescribed: None  Taking:         Reviewed prior external information including notes and imaging from previsou exam, outside providers and external EMR if available.   As well as notes that were available from care everywhere and other healthcare systems.  Past medical history, social, surgical and family history all reviewed in electronic medical record.  No pertanent information unless stated regarding to the chief complaint.   Past Medical History:  Diagnosis Date   Fibromyalgia    H/O fatigue    Insomnia    PONV (postoperative nausea and vomiting)    severe    No Known Allergies   Review of Systems:  No headache, visual changes, nausea, vomiting, diarrhea, constipation, dizziness, abdominal pain, skin rash, fevers, chills, night sweats, weight loss, swollen lymph nodes, body aches, joint swelling, chest pain, shortness of breath, mood changes. POSITIVE muscle aches  Objective  Blood pressure 122/84, pulse 72, height 5' 4 (1.626 m), weight 145 lb (65.8 kg), last menstrual period 12/12/2012, SpO2 98%.   General: No apparent distress alert and oriented x3 mood and affect normal, dressed appropriately.  HEENT: Pupils equal, extraocular movements intact  Respiratory: Patient's speak in full sentences and does not appear short of breath  Cardiovascular: No lower extremity edema, non tender, no erythema  Gait MSK:  Back does have some loss lordosis noted.  Some degenerative scoliosis  noted.  Tightness with Deri right greater than left.  Negative straight leg test noted.  Neck exam some limited sidebending bilaterally.  Osteopathic findings  C3 flexed rotated and side bent right C6 flexed rotated and side bent left T3 extended rotated and side bent right inhaled rib T6 extended rotated and side bent left L2 flexed rotated and side bent right Sacrum right on right       Assessment and Plan:  Lumbar radiculopathy Discussed with patient icing regimen and home exercises.  Has done well with aquatic therapy.  Degenerative disc disease with multiple different other underlying conditions that could be potentially contributing.  Encouraged patient to continue to be active.  Follow-up again in 6 to 8 weeks otherwise.  Cervical disc disorder with radiculopathy of cervical region Chronic tightness and has had degenerative disc disease previously.  Had an acute exacerbation after 1 visit.  Discussed with patient about repetitive extension of the neck.  Increase activity slowly.  Follow-up again in 6 to 8 weeks.  Reordered aquatic therapy.    Nonallopathic problems  Decision today to treat with OMT was based on Physical Exam  After verbal consent patient was treated with HVLA, ME, FPR techniques in cervical, rib, thoracic, lumbar, and sacral  areas  Patient tolerated the procedure well with improvement in symptoms  Patient given exercises, stretches and lifestyle modifications  See medications in patient instructions if given  Patient will follow up in 4-8 weeks      The above documentation has been reviewed and is accurate and complete Patricia Pore M Abrey Bradway, DO  Note: This dictation was prepared with Dragon dictation along with smaller phrase technology. Any transcriptional errors that result from this process are unintentional.

## 2023-01-23 ENCOUNTER — Encounter: Payer: Self-pay | Admitting: Family Medicine

## 2023-01-23 ENCOUNTER — Ambulatory Visit: Payer: BC Managed Care – PPO | Admitting: Family Medicine

## 2023-01-23 VITALS — BP 122/84 | HR 72 | Ht 64.0 in | Wt 145.0 lb

## 2023-01-23 DIAGNOSIS — R600 Localized edema: Secondary | ICD-10-CM

## 2023-01-23 DIAGNOSIS — M25552 Pain in left hip: Secondary | ICD-10-CM

## 2023-01-23 DIAGNOSIS — M9903 Segmental and somatic dysfunction of lumbar region: Secondary | ICD-10-CM

## 2023-01-23 DIAGNOSIS — M9904 Segmental and somatic dysfunction of sacral region: Secondary | ICD-10-CM | POA: Diagnosis not present

## 2023-01-23 DIAGNOSIS — M5416 Radiculopathy, lumbar region: Secondary | ICD-10-CM | POA: Diagnosis not present

## 2023-01-23 DIAGNOSIS — M5010 Cervical disc disorder with radiculopathy, unspecified cervical region: Secondary | ICD-10-CM | POA: Diagnosis not present

## 2023-01-23 DIAGNOSIS — M9901 Segmental and somatic dysfunction of cervical region: Secondary | ICD-10-CM | POA: Diagnosis not present

## 2023-01-23 DIAGNOSIS — M9908 Segmental and somatic dysfunction of rib cage: Secondary | ICD-10-CM

## 2023-01-23 DIAGNOSIS — M9902 Segmental and somatic dysfunction of thoracic region: Secondary | ICD-10-CM

## 2023-01-23 NOTE — Assessment & Plan Note (Addendum)
 Chronic tightness and has had degenerative disc disease previously.  Had an acute exacerbation after 1 visit.  Discussed with patient about repetitive extension of the neck.  Increase activity slowly.  Follow-up again in 6 to 8 weeks.  Reordered aquatic therapy.

## 2023-01-23 NOTE — Assessment & Plan Note (Signed)
 Has had leg edema noted previously, do not see any findings of it.  We did discuss though that if worsening swelling, pain, redness or swelling to seek medical attention.  Does feel to have a potential Baker's cyst that we can monitor.  No pain at the moment and no recent travel history.

## 2023-01-23 NOTE — Patient Instructions (Addendum)
 PT referral We'll watch knee See you again in 6-8 weeks

## 2023-01-23 NOTE — Assessment & Plan Note (Signed)
 Discussed with patient icing regimen and home exercises.  Has done well with aquatic therapy.  Degenerative disc disease with multiple different other underlying conditions that could be potentially contributing.  Encouraged patient to continue to be active.  Follow-up again in 6 to 8 weeks otherwise.

## 2023-01-26 ENCOUNTER — Ambulatory Visit (HOSPITAL_BASED_OUTPATIENT_CLINIC_OR_DEPARTMENT_OTHER): Payer: BC Managed Care – PPO | Admitting: Physical Therapy

## 2023-01-30 ENCOUNTER — Encounter (HOSPITAL_BASED_OUTPATIENT_CLINIC_OR_DEPARTMENT_OTHER): Payer: Self-pay | Admitting: Physical Therapy

## 2023-01-30 ENCOUNTER — Ambulatory Visit (HOSPITAL_BASED_OUTPATIENT_CLINIC_OR_DEPARTMENT_OTHER): Payer: BC Managed Care – PPO | Admitting: Physical Therapy

## 2023-01-30 DIAGNOSIS — M6281 Muscle weakness (generalized): Secondary | ICD-10-CM | POA: Diagnosis not present

## 2023-01-30 DIAGNOSIS — M25552 Pain in left hip: Secondary | ICD-10-CM | POA: Diagnosis not present

## 2023-01-30 DIAGNOSIS — R293 Abnormal posture: Secondary | ICD-10-CM | POA: Diagnosis not present

## 2023-01-30 DIAGNOSIS — M5416 Radiculopathy, lumbar region: Secondary | ICD-10-CM | POA: Diagnosis not present

## 2023-01-30 DIAGNOSIS — M5459 Other low back pain: Secondary | ICD-10-CM | POA: Diagnosis not present

## 2023-01-30 NOTE — Therapy (Signed)
OUTPATIENT PHYSICAL THERAPY THORACOLUMBAR TREATMENT Progress Note Reporting Period 11/27/23 to 01/30/23  See note below for Objective Data and Assessment of Progress/Goals.       Patient Name: Patricia Lloyd MRN: [id number] DOB:1960/03/17, 63 y.o., female Today's Date: 01/30/2023  END OF SESSION:  PT End of Session - 01/30/23 1347     Visit Number 11    Number of Visits 19    Date for PT Re-Evaluation 04/06/23    Authorization Type BC[employer]    Authorization Time Period 12/3-1/31    Authorization - Number of Visits 5    PT Start Time 1032    PT Stop Time 1110    PT Time Calculation (min) 38 min    Activity Tolerance Patient tolerated treatment well    Behavior During Therapy WFL for tasks assessed/performed                Past Medical History:  Diagnosis Date   Fibromyalgia    H/O fatigue    Insomnia    PONV (postoperative nausea and vomiting)    severe   Past Surgical History:  Procedure Laterality Date   CESAREAN SECTION  1995   CESAREAN SECTION  2000   DILATATION & CURETTAGE/HYSTEROSCOPY WITH TRUECLEAR N/A 01/01/2013   Procedure: DILATATION & CURETTAGE/HYSTEROSCOPY WITH TRUCLEAR;  Surgeon: Meriel Pica, MD;  Location: [facility];  Service: Gynecology;  Laterality: N/A;   SHOULDER SURGERY  05/03/2010   Left shoulder surgery, Dr.Doldorf     TMJ ARTHROPLASTY  1990   Patient Active Problem List   Diagnosis Date Noted   Aortic root enlargement (HCC) 12/14/2022   Somatic dysfunction of spine, sacral 10/17/2022   Cervical disc disorder with radiculopathy of cervical region 04/27/2021   Lumbar radiculopathy 04/27/2021   Dyslipidemia 03/02/2021   Fracture of one rib of left side with delayed healing 02/19/2020   Lateral pain of left hip 07/15/2019   Precordial chest pain 01/31/2019   Palpitation 01/30/2019   Educated about COVID-19 virus infection 01/30/2019   Urinary incontinence 01/20/2019   Loss of transverse plantar arch of right foot 07/02/2018    Peroneal tendinitis, right 07/02/2018   Morton neuroma, right 07/02/2018   Sinusitis, acute 04/21/2015   Cervical radiculitis 09/03/2014   Left arm pain 09/02/2014   Strain of right tibialis anterior muscle 06/24/2014   Tendonitis of left rotator cuff 06/24/2014   Fibromyalgia 06/10/2014   Varicose veins of bilateral lower extremities with other complications 01/28/2013   Leg edema, right 01/28/2013   Dizziness and giddiness 12/25/2012    PCP:   Enid Baas, MD    REFERRING PROVIDER: Judi Saa, DO /   Enid Baas, MD    REFERRING DIAG:  Scoliosis of lumbar spine, unspecified scoliosis type  M51.369 (ICD-10-CM) - Degeneration of intervertebral disc of lumbar region without discogenic back pain or lower extremity pain    Lumbar radiculopathy  M25.552 (ICD-10-CM) - Lateral pain of left hip    Rationale for Evaluation and Treatment: Rehabilitation  THERAPY DIAG:  Other low back pain  Muscle weakness (generalized)  Abnormal posture  ONSET DATE: increasing over then last year  SUBJECTIVE:  SUBJECTIVE STATEMENT: my neck and back are better since DN still sore.  Too cold I don't want to get into pool.  POOL ACCESS:  Pt plans to join [employer] at d/c.   PERTINENT HISTORY:  -Foot pain on the right and symptoms consistent with a -Morton's neuroma  -bilateral hip pain that hurts in her groin/ MRIs which showed some labral pathology in the left hip and some mild arthritis of both hip joints -significant scoliosis with a convex curve    PAIN:  Are you having pain? Yes: NPRS scale: Current 6/10- Rt hip/knee,  L hip 7/10  Pain location: see above Pain description: constant in left lumbar spine and hip Aggravating factors: walking; standing, pilates  Relieving factors:  nothing  PRECAUTIONS: None  RED FLAGS: None   WEIGHT BEARING RESTRICTIONS: No  FALLS:  Has patient fallen in last 6 months? 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  DIAGNOSTIC FINDINGS:  Mild levoscoliosis of lumbar spine is noted. Minimal grade 1  retrolisthesis of L1-2 is noted secondary to severe degenerative  disc disease at this level. Mild grade 1 anterolisthesis of L4-5 is  noted secondary to posterior facet joint hypertrophy. Moderate  degenerative disc disease is noted at L5-S1.   PATIENT SURVEYS:  FOTO Primary score: 68% risk adjusted 53 goal 70 FOTO 01/11/23: 56%    COGNITION: Overall cognitive status: Within functional limits for tasks assessed     SENSATION: WFL  MUSCLE LENGTH: Hamstrings: sitting wfl   POSTURE:  left higher than right Pt wearing right shoe orthotic due to pronation  PALPATION: Slight TTP bilat hip about bursa area  LUMBAR ROM:  WFL  LOWER EXTREMITY STRENGTH     MMT Right eval Left eval Rt/Lt  1/2 R / L 1/21  Hip flexion 48.1 45.0    Hip extension      Hip abduction 24.8 25.5 37.2/40.1 34.2 / 36.2  Hip adduction      Hip internal rotation      Hip external rotation      Knee flexion      Knee extension 45.7 46.6    Ankle dorsiflexion      Ankle plantarflexion      Ankle inversion      Ankle eversion       (Blank rows = not tested)     FUNCTIONAL TESTS:  5 times sit to stand: 13.72 Timed up and go (TUG): 9.77   4 stage balance: passed    1/21: 5 XSTS 9.40    GAIT: Distance walked: 500 ft Assistive device utilized: None Level of assistance: Complete  Independence Comments: wfl  TODAY'S TREATMENT:      01/30/23 Re-assess pt declined pool submersion due to cold weather  Self care: progression of pool to land. Continuation of Pliates/Yoga classes. Aquatic HEP.  Future management of chronic condition.   Treatment                            01/18/23:  Trigger Point Dry Needling  Initial Treatment: Pt instructed on Dry Needling rational, procedures, and possible side effects. Pt instructed to expect mild to moderate muscle  soreness later in the day and/or into the next day.  Pt instructed in methods to reduce muscle soreness. Pt instructed to continue prescribed HEP. Because Dry Needling was performed over or adjacent to a lung field, pt was educated on S/S of pneumothorax and to seek immediate medical attention should they occur.  Patient was educated on signs and symptoms of infection and other risk factors and advised to seek medical attention should they occur.  Patient verbalized understanding of these instructions and education.   Patient Verbal Consent Given: Yes Education Handout Provided: Yes Muscles Treated: bilateral upper trap & levator Electrical Stimulation Performed: No Treatment Response/Outcome: significant twitch with decreased concordant pain  STM suboccipitals Cervical traction Supine chin tuck Supine T pec stretch Discussed driving & pain management   01/14/22                                                                                                                          Pt seen for aquatic therapy today.  Treatment took place in water 3.5-4.75 ft in depth at the [facility] pool. Temp of water was 91.  Pt entered/exited the pool via stairs independently with bilat rail.    * walking forward backward with reciprocal arm swing - multiple laps  * side stepping with rainbow arm addct/ abdct  * Single leg supermans x 10 each, not touching down between reps *Plank with arm raises x 8; leg lifts x  8 *suspended sup using blue HB holding for 10s x3 for isometric abd strengthening. A few trials required for proper execution with vc and demonstration from deck.  - suspended prone caused LBP so discontinued. * straddling noodle: cycling multiple laps with breast stroke arms, varying speed, cross country ski, suspended jumping jacks * seated balance on yellow noodle (like swing) bilat ue elevated x 10 s - hands on knees x 20s.  - sitting balance supported above noodle x 3 holding x 10s.  Reverse plank x 1 trial with difficulty    Treatment                            1/2:  Hooklying progression: ab set, heel slides, bicycle from TT Qped bird dog with foot slide Qped hip hike- Lt knee on airex Tall kneel glut + ab set; + lean back; bridge; bridge + OH reach Sidelying: oblique engagement + clam, + hip abd, +hip abd with flexion/ext; side plank elbow/knee SLS- +alt pull fwd green tband post anchor   Treatment                            12/16:  Hooklying ab set for rib flare reduction Progression to hundreds- UE only, +EL 90, +head lift Qped ab set Prone STM to mid-thoracic paraspinals Seated rib cage depression and breathing into post rib cage.    PATIENT EDUCATION:  Education details: Intro to  aquatic therapy  Person educated: Patient Education method: Explanation Education comprehension: verbalized understanding  HOME EXERCISE PROGRAM: Rib flare reduction in pilates [id number]  ASSESSMENT:  CLINICAL IMPRESSION: PN: Has made steady but slow improvement.  She has improved in muscle strength meeting goal. She has also improved in her functional testing/ 5 x STS test reaching MDC/demonstrating statistically significant improvement. She continues to have high levels of pain which is reduced with submersion during aquatic sessions but has not yet had a residual effect lasting or overall reduction.  She will  benefit from continued PT but will focus land based.  Plan to see her in pool  x 2 more visits to create and finalize aquatic HEP.       Previous Impression. Patient has made improvement in objective measurements regarding strength mobility and performance of form and home exercises.  She denies any changes in pain levels and feels that her function continues to be at the same level.  Discussion was held regarding lag time behind functional improvement versus objective measure strength.  She has 3 more visits allowed in her current authorization and will need to be resubmitted for insurance extension at that time.  Will continue to benefit from skilled physical therapy to continue gains and progress functional ability to meet long-term goals.   OBJECTIVE IMPAIRMENTS: decreased knowledge of condition, decreased strength, and pain.    PARTICIPATION LIMITATIONS: driving    REHAB POTENTIAL: Good  CLINICAL DECISION MAKING: Evolving/moderate complexity  EVALUATION COMPLEXITY: Moderate   GOALS: Goals reviewed with patient? Yes  SHORT TERM GOALS: Target date: 12/27/22  Pt will tolerate full aquatic sessions consistently without increase in pain and with improving function to demonstrate good toleration and effectiveness of intervention.  Baseline: Goal status: Met 12/14/22  2.  Pt will be indep with aquatic HEP and have decided upon value of intervention to gain pool access. Baseline:  Goal status: In progress - 12/22/22  3.  Pt will report decrease of max pain to </= to 5/10 for improved daily function Baseline: 8/10 max,  daily=5/10 Goal status: In Progress -12/22/22/ In progress 01/30/23    LONG TERM GOALS: Target date: 04/06/23  Pt to meet stated Foto Goal 70 Baseline: 53 Goal status: ongoing 01/30/23  2.  Pt will be indep with final HEP's (land and aquatic as appropriate) for continued management of condition Baseline:  Goal status: ongoing 01/30/23  3.  Pt will improve hip abd strength  by 10 lbs to demonstrate improved overall physical  function Baseline: See chart  Goal status: achieved 01/30/23  4.  Pt will report a decrease in normal pain to </=3/10 for improved functional ability/toleration Baseline: 5/10, never below a 5 Goal status: ongoing 01/30/23  5.  Pt will report return to daily (weekly) exercise regimen for improved overall health and well being  Baseline:  Goal status: in progress 01/30/23    PLAN:  PT FREQUENCY: 1xweek  PT DURATION:8 visits  PLANNED INTERVENTIONS: 97164- PT Re-evaluation, 97110-Therapeutic exercises, 97530- Therapeutic activity, 97112- Neuromuscular re-education, 97535- Self Care, 40981- Manual therapy, [phone]- Gait training, [phone]- Orthotic Fit/training, [phone]- Aquatic Therapy, [phone]- Electrical stimulation (unattended), Patient/Family education, Balance training, Stair training, Taping, Dry Needling, Joint mobilization, Spinal manipulation, DME instructions, Cryotherapy, and Moist heat.  PLAN FOR NEXT SESSION: ERO   Rushie Chestnut) Jonee Lamore MPT 01/30/23 1:51 PM [facility] [address] [place], [place], [address] Phone: [phone]   Fax:  [phone]

## 2023-02-05 ENCOUNTER — Encounter (HOSPITAL_BASED_OUTPATIENT_CLINIC_OR_DEPARTMENT_OTHER): Payer: Self-pay | Admitting: Physical Therapy

## 2023-02-05 ENCOUNTER — Ambulatory Visit (HOSPITAL_BASED_OUTPATIENT_CLINIC_OR_DEPARTMENT_OTHER): Payer: BC Managed Care – PPO | Admitting: Physical Therapy

## 2023-02-05 DIAGNOSIS — R293 Abnormal posture: Secondary | ICD-10-CM

## 2023-02-05 DIAGNOSIS — M5459 Other low back pain: Secondary | ICD-10-CM

## 2023-02-05 DIAGNOSIS — M6281 Muscle weakness (generalized): Secondary | ICD-10-CM | POA: Diagnosis not present

## 2023-02-05 DIAGNOSIS — M5416 Radiculopathy, lumbar region: Secondary | ICD-10-CM | POA: Diagnosis not present

## 2023-02-05 DIAGNOSIS — M25552 Pain in left hip: Secondary | ICD-10-CM | POA: Diagnosis not present

## 2023-02-05 NOTE — Therapy (Signed)
OUTPATIENT PHYSICAL THERAPY THORACOLUMBAR TREATMENT    Patient Name: Patricia Lloyd MRN: [id number] DOB:[birth year], 64 y.o., female Today's Date: 02/05/2023  END OF SESSION:  PT End of Session - 02/05/23 1036     Visit Number 12    Number of Visits 19    Date for PT Re-Evaluation 04/06/23    Authorization Type [employer]    Authorization Time Period 12/3-1/31    Authorization - Number of Visits 5    PT Start Time 1033    PT Stop Time 1115    PT Time Calculation (min) 42 min    Activity Tolerance Patient tolerated treatment well    Behavior During Therapy WFL for tasks assessed/performed                 Past Medical History:  Diagnosis Date   Fibromyalgia    H/O fatigue    Insomnia    PONV (postoperative nausea and vomiting)    severe   Past Surgical History:  Procedure Laterality Date   CESAREAN SECTION  1995   CESAREAN SECTION  2000   DILATATION & CURETTAGE/HYSTEROSCOPY WITH TRUECLEAR N/A 01/01/2013   Procedure: DILATATION & CURETTAGE/HYSTEROSCOPY WITH TRUCLEAR;  Surgeon: Meriel Pica, MD;  Location: [facility];  Service: Gynecology;  Laterality: N/A;   SHOULDER SURGERY  05/03/2010   Left shoulder surgery, Dr.Doldorf     TMJ ARTHROPLASTY  1990   Patient Active Problem List   Diagnosis Date Noted   Aortic root enlargement (HCC) 12/14/2022   Somatic dysfunction of spine, sacral 10/17/2022   Cervical disc disorder with radiculopathy of cervical region 04/27/2021   Lumbar radiculopathy 04/27/2021   Dyslipidemia 03/02/2021   Fracture of one rib of left side with delayed healing 02/19/2020   Lateral pain of left hip 07/15/2019   Precordial chest pain 01/31/2019   Palpitation 01/30/2019   Educated about COVID-19 virus infection 01/30/2019   Urinary incontinence 01/20/2019   Loss of transverse plantar arch of right foot 07/02/2018   Peroneal tendinitis, right 07/02/2018   Morton neuroma, right 07/02/2018   Sinusitis, acute 04/21/2015   Cervical radiculitis  09/03/2014   Left arm pain 09/02/2014   Strain of right tibialis anterior muscle 06/24/2014   Tendonitis of left rotator cuff 06/24/2014   Fibromyalgia 06/10/2014   Varicose veins of bilateral lower extremities with other complications 01/28/2013   Leg edema, right 01/28/2013   Dizziness and giddiness 12/25/2012    PCP:   Enid Baas, MD    REFERRING PROVIDER: Judi Saa, DO /   Enid Baas, MD    REFERRING DIAG:  Scoliosis of lumbar spine, unspecified scoliosis type  M51.369 (ICD-10-CM) - Degeneration of intervertebral disc of lumbar region without discogenic back pain or lower extremity pain    Lumbar radiculopathy  M25.552 (ICD-10-CM) - Lateral pain of left hip    Rationale for Evaluation and Treatment: Rehabilitation  THERAPY DIAG:  Other low back pain  Muscle weakness (generalized)  Abnormal posture  ONSET DATE: increasing over then last year  SUBJECTIVE:  SUBJECTIVE STATEMENT: Pain bad yesterday 10/10, tried to come of meloxicam for 1 week to give kidneys a break  POOL ACCESS:  Pt plans to join [facility] at d/c.   PERTINENT HISTORY:  -Foot pain on the right and symptoms consistent with a -Morton's neuroma  -bilateral hip pain that hurts in her groin/ MRIs which showed some labral pathology in the left hip and some mild arthritis of both hip joints -significant scoliosis with a convex curve    PAIN:  Are you having pain? Yes: NPRS scale: Current 6/10- Rt hip/knee,  L hip 6/10  Pain location: see above Pain description: constant in left lumbar spine and hip Aggravating factors: walking; standing, pilates  Relieving factors: nothing  PRECAUTIONS: None  RED FLAGS: None   WEIGHT BEARING RESTRICTIONS: No  FALLS:  Has patient fallen in last 6 months? 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  DIAGNOSTIC FINDINGS:  Mild levoscoliosis of lumbar spine is noted. Minimal grade 1  retrolisthesis of L1-2 is noted secondary to severe degenerative  disc disease at this level. Mild grade 1 anterolisthesis of L4-5 is  noted secondary to posterior facet joint hypertrophy. Moderate  degenerative disc disease is noted at L5-S1.   PATIENT SURVEYS:  FOTO Primary score: 68% risk adjusted 53 goal 70 FOTO 01/11/23: 56%    COGNITION: Overall cognitive status: Within functional limits for tasks assessed     SENSATION: WFL  MUSCLE LENGTH: Hamstrings: sitting wfl   POSTURE:  left higher than right Pt wearing right shoe orthotic due to pronation  PALPATION: Slight TTP bilat hip about bursa area  LUMBAR ROM:  WFL  LOWER EXTREMITY STRENGTH     MMT Right eval Left eval Rt/Lt  1/2 R / L 1/21  Hip flexion 48.1 45.0    Hip extension      Hip abduction 24.8 25.5 37.2/40.1 34.2 / 36.2  Hip adduction      Hip internal rotation      Hip external rotation      Knee flexion      Knee extension 45.7 46.6    Ankle dorsiflexion      Ankle plantarflexion      Ankle inversion      Ankle eversion       (Blank rows = not tested)     FUNCTIONAL TESTS:  5 times sit to stand: 13.72 Timed up and go (TUG): 9.77   4 stage balance: passed    1/21: 5 XSTS 9.40    GAIT: Distance walked: 500 ft Assistive device utilized: None Level of assistance: Complete Independence Comments: wfl  TODAY'S TREATMENT:     02/05/23  Exercises -HB carry - Noodle press  - Warrior III   - Plank - Plank on [employer]  with Arm Lifts   - Plank on [employer] with Leg Lift - Sitting Balance on [employer]   -cycling on noodle interval training with breast stroke arm  01/30/23 Re-assess pt declined pool submersion due to cold weather  Self care: progression of pool to land. Continuation of Pliates/Yoga classes. Aquatic HEP.  Future management of chronic condition.   Treatment  01/18/23:  Trigger Point Dry Needling  Initial Treatment: Pt instructed on Dry Needling rational, procedures, and possible side effects. Pt instructed to expect mild to moderate muscle soreness later in the day and/or into the next day.  Pt instructed in methods to reduce muscle soreness. Pt instructed to continue prescribed HEP. Because Dry Needling was performed over or adjacent to a lung field, pt was educated on S/S of pneumothorax and to seek immediate medical attention should they occur.  Patient was educated on signs and symptoms of infection and other risk factors and advised to seek medical attention should they occur.  Patient verbalized understanding of these instructions and education.   Patient Verbal Consent Given: Yes Education Handout Provided: Yes Muscles Treated: bilateral upper trap & levator Electrical Stimulation Performed: No Treatment Response/Outcome: significant twitch with decreased concordant pain  STM suboccipitals Cervical traction Supine chin tuck Supine T pec stretch Discussed driving & pain management   01/14/22                                                                                                                          Pt seen for aquatic therapy today.  Treatment took place in water 3.5-4.75 ft in depth at the [facility] pool. Temp of water was 91.  Pt entered/exited the pool via stairs independently with bilat rail.    * walking forward backward with reciprocal arm swing - multiple laps  * side stepping with rainbow arm addct/ abdct  *  Single leg supermans x 10 each, not touching down between reps *Plank with arm raises x 8; leg lifts x 8 *suspended sup using blue HB holding for 10s x3 for isometric abd strengthening. A few trials required for proper execution with vc and demonstration from deck.  - suspended prone caused LBP so discontinued. * straddling noodle: cycling multiple laps with breast stroke arms, varying speed, cross country ski, suspended jumping jacks * seated balance on yellow noodle (like swing) bilat ue elevated x 10 s - hands on knees x 20s.  - sitting balance supported above noodle x 3 holding x 10s.  Reverse plank x 1 trial with difficulty    Treatment                            1/2:  Hooklying progression: ab set, heel slides, bicycle from TT Qped bird dog with foot slide Qped hip hike- Lt knee on airex Tall kneel glut + ab set; + lean back; bridge; bridge + OH reach Sidelying: oblique engagement + clam, + hip abd, +hip abd with flexion/ext; side plank elbow/knee SLS- +alt pull fwd green tband post anchor   Treatment                            12/16:  Hooklying ab set for rib flare reduction Progression to hundreds- UE only, +EL 90, +head lift  Qped ab set Prone STM to mid-thoracic paraspinals Seated rib cage depression and breathing into post rib cage.    PATIENT EDUCATION:  Education details: Intro to aquatic therapy  Person educated: Patient Education method: Explanation Education comprehension: verbalized understanding  HOME EXERCISE PROGRAM: Rib flare reduction in pilates [id number]  Aquatic  This aquatic home exercise program from [employer] utilizes pictures from land based exercises, but has been adapted prior to lamination and issuance.   Access Code: [email or web address] URL: [email or web address] Date: 02/05/2023 Prepared by: Geni Bers  Exercises - Hand Buoy Carry  - 1 x daily - 1-3 x weekly - 1-2 sets - 10 reps - Side lunge with hand buoys  - 1 x daily - 1-3 x  weekly - 1-2 sets - 10 reps - Noodle press  - 1 x daily - 1-3 x weekly - 1-2 sets - 10 reps - Warrior III  - 1 x daily - 1-3 x weekly - 1-2 sets - 10 reps - Plank on [employer] with Arm Lifts  - 1 x daily - 1-2 x weekly - 1-2 sets - 10 reps - Plank on Long Hand Float  - 1 x daily - 1-3 x weekly - 1 sets - 3 reps - 20-30s hold - Plank on Long Hand Float with Leg Lift  - 1 x daily - 1-2 x weekly - 1-2 sets - 10 reps - Sitting Balance on Pool Noodle  - 1 x daily - 1-3 x weekly - Seated Straddle on Flotation Forward Breast Stroke Arms and Bicycle Legs  - 1 x daily - 1-3 x weekly NOT ISSUED - printed ASSESSMENT:  CLINICAL IMPRESSION: HEP creation initiated. Began instructing to ensure understanding. Pt to join here at [facility] after DC. Voices she would rather complete program indep than engage in water aerobics class.  She is given vc throughout.  Instructed on variations to increase/decrease intensity. Overall very good execution and toleration of exercises. Plan to issue HEP and complete final instruction.   PN: Has made steady but slow improvement.  She has improved in muscle strength meeting goal. She has also improved in her functional testing/ 5 x STS test reaching MDC/demonstrating statistically significant improvement. She continues to have high levels of pain which is reduced with submersion during aquatic sessions but has not yet had a residual effect lasting or overall reduction.  She will  benefit from continued PT but will focus land based.  Plan to see her in pool x 2 more visits to create and finalize aquatic HEP.      OBJECTIVE IMPAIRMENTS: decreased knowledge of condition, decreased strength, and pain.    PARTICIPATION LIMITATIONS: driving    REHAB POTENTIAL: Good  CLINICAL DECISION MAKING: Evolving/moderate complexity  EVALUATION COMPLEXITY: Moderate   GOALS: Goals reviewed with patient? Yes  SHORT TERM GOALS: Target date: 12/27/22  Pt will tolerate full  aquatic sessions consistently without increase in pain and with improving function to demonstrate good toleration and effectiveness of intervention.  Baseline: Goal status: Met 12/14/22  2.  Pt will be indep with aquatic HEP and have decided upon value of intervention to gain pool access. Baseline:  Goal status: In progress - 12/22/22  3.  Pt will report decrease of max pain to </= to 5/10 for improved daily function Baseline: 8/10 max,  daily=5/10 Goal status: In Progress -12/22/22/ In progress 01/30/23    LONG TERM GOALS: Target date: 04/06/23  Pt to meet stated Foto Goal 70 Baseline: 53 Goal status: ongoing  01/30/23  2.  Pt will be indep with final HEP's (land and aquatic as appropriate) for continued management of condition Baseline:  Goal status: ongoing 01/30/23  3.  Pt will improve hip abd strength  by 10 lbs to demonstrate improved overall physical function Baseline: See chart  Goal status: achieved 01/30/23  4.  Pt will report a decrease in normal pain to </=3/10 for improved functional ability/toleration Baseline: 5/10, never below a 5 Goal status: ongoing 01/30/23  5.  Pt will report return to daily (weekly) exercise regimen for improved overall health and well being  Baseline:  Goal status: in progress 01/30/23    PLAN:  PT FREQUENCY: 1xweek  PT DURATION:8 visits  PLANNED INTERVENTIONS: 97164- PT Re-evaluation, 97110-Therapeutic exercises, 97530- Therapeutic activity, 97112- Neuromuscular re-education, 97535- Self Care, 08657- Manual therapy, [phone]- Gait training, [phone]- Orthotic Fit/training, [phone]- Aquatic Therapy, [phone]- Electrical stimulation (unattended), Patient/Family education, Balance training, Stair training, Taping, Dry Needling, Joint mobilization, Spinal manipulation, DME instructions, Cryotherapy, and Moist heat.  PLAN FOR NEXT SESSION: ERO   Rushie Chestnut) Wilbon Obenchain MPT 02/05/23 10:37 AM [facility] [address] [place], [place], [address] Phone: [phone]   Fax:  [phone]

## 2023-02-06 DIAGNOSIS — E785 Hyperlipidemia, unspecified: Secondary | ICD-10-CM | POA: Diagnosis not present

## 2023-02-06 DIAGNOSIS — R5383 Other fatigue: Secondary | ICD-10-CM | POA: Diagnosis not present

## 2023-02-06 DIAGNOSIS — M2550 Pain in unspecified joint: Secondary | ICD-10-CM | POA: Diagnosis not present

## 2023-02-06 DIAGNOSIS — E559 Vitamin D deficiency, unspecified: Secondary | ICD-10-CM | POA: Diagnosis not present

## 2023-02-06 DIAGNOSIS — R7309 Other abnormal glucose: Secondary | ICD-10-CM | POA: Diagnosis not present

## 2023-02-12 ENCOUNTER — Ambulatory Visit (HOSPITAL_BASED_OUTPATIENT_CLINIC_OR_DEPARTMENT_OTHER): Payer: BC Managed Care – PPO | Attending: Sports Medicine | Admitting: Physical Therapy

## 2023-02-12 ENCOUNTER — Encounter (HOSPITAL_BASED_OUTPATIENT_CLINIC_OR_DEPARTMENT_OTHER): Payer: Self-pay | Admitting: Physical Therapy

## 2023-02-12 DIAGNOSIS — M5459 Other low back pain: Secondary | ICD-10-CM | POA: Insufficient documentation

## 2023-02-12 DIAGNOSIS — R293 Abnormal posture: Secondary | ICD-10-CM | POA: Insufficient documentation

## 2023-02-12 DIAGNOSIS — M6281 Muscle weakness (generalized): Secondary | ICD-10-CM | POA: Insufficient documentation

## 2023-02-12 NOTE — Therapy (Signed)
OUTPATIENT PHYSICAL THERAPY THORACOLUMBAR TREATMENT    Patient Name: Patricia Lloyd MRN: [id number] DOB:22-Mar-1960, 63 y.o., female Today's Date: 02/12/2023  END OF SESSION:  PT End of Session - 02/12/23 1017     Visit Number 13    Number of Visits 19    Date for PT Re-Evaluation 04/06/23    Authorization Type BCBS    Authorization Time Period 12/3-1/31    Authorization - Number of Visits 5    PT Start Time 0935    PT Stop Time 1015    PT Time Calculation (min) 40 min    Activity Tolerance Patient tolerated treatment well    Behavior During Therapy WFL for tasks assessed/performed                  Past Medical History:  Diagnosis Date   Fibromyalgia    H/O fatigue    Insomnia    PONV (postoperative nausea and vomiting)    severe   Past Surgical History:  Procedure Laterality Date   CESAREAN SECTION  1995   CESAREAN SECTION  2000   DILATATION & CURETTAGE/HYSTEROSCOPY WITH TRUECLEAR N/A 01/01/2013   Procedure: DILATATION & CURETTAGE/HYSTEROSCOPY WITH TRUCLEAR;  Surgeon: Meriel Pica, MD;  Location: [facility];  Service: Gynecology;  Laterality: N/A;   SHOULDER SURGERY  05/03/2010   Left shoulder surgery, Dr.Doldorf     TMJ ARTHROPLASTY  1990   Patient Active Problem List   Diagnosis Date Noted   Aortic root enlargement (HCC) 12/14/2022   Somatic dysfunction of spine, sacral 10/17/2022   Cervical disc disorder with radiculopathy of cervical region 04/27/2021   Lumbar radiculopathy 04/27/2021   Dyslipidemia 03/02/2021   Fracture of one rib of left side with delayed healing 02/19/2020   Lateral pain of left hip 07/15/2019   Precordial chest pain 01/31/2019   Palpitation 01/30/2019   Educated about COVID-19 virus infection 01/30/2019   Urinary incontinence 01/20/2019   Loss of transverse plantar arch of right foot 07/02/2018   Peroneal tendinitis, right 07/02/2018   Morton neuroma, right 07/02/2018   Sinusitis, acute 04/21/2015   Cervical radiculitis  09/03/2014   Left arm pain 09/02/2014   Strain of right tibialis anterior muscle 06/24/2014   Tendonitis of left rotator cuff 06/24/2014   Fibromyalgia 06/10/2014   Varicose veins of bilateral lower extremities with other complications 01/28/2013   Leg edema, right 01/28/2013   Dizziness and giddiness 12/25/2012    PCP:   Enid Baas, MD    REFERRING PROVIDER: Judi Saa, DO /   Enid Baas, MD    REFERRING DIAG:  Scoliosis of lumbar spine, unspecified scoliosis type  M51.369 (ICD-10-CM) - Degeneration of intervertebral disc of lumbar region without discogenic back pain or lower extremity pain    Lumbar radiculopathy  M25.552 (ICD-10-CM) - Lateral pain of left hip    Rationale for Evaluation and Treatment: Rehabilitation  THERAPY DIAG:  Other low back pain  Muscle weakness (generalized)  Abnormal posture  ONSET DATE: increasing over then last year  SUBJECTIVE:  SUBJECTIVE STATEMENT: Pain bad yesterday 10/10, tried to come of meloxicam for 1 week to give kidneys a break  POOL ACCESS:  Pt plans to join [employer] at d/c.   PERTINENT HISTORY:  -Foot pain on the right and symptoms consistent with a -Morton's neuroma  -bilateral hip pain that hurts in her groin/ MRIs which showed some labral pathology in the left hip and some mild arthritis of both hip joints -significant scoliosis with a convex curve    PAIN:  Are you having pain? Yes: NPRS scale: Current 6/10- Rt hip/knee,  L hip 6/10  Pain location: see above Pain description: constant in left lumbar spine and hip Aggravating factors: walking; standing, pilates  Relieving factors: nothing  PRECAUTIONS: None  RED FLAGS: None   WEIGHT BEARING RESTRICTIONS: No  FALLS:  Has patient fallen in last 6 months? 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  DIAGNOSTIC FINDINGS:  Mild levoscoliosis of lumbar spine is noted. Minimal grade 1  retrolisthesis of L1-2 is noted secondary to severe degenerative  disc disease at this level. Mild grade 1 anterolisthesis of L4-5 is  noted secondary to posterior facet joint hypertrophy. Moderate  degenerative disc disease is noted at L5-S1.   PATIENT SURVEYS:  FOTO Primary score: 68% risk adjusted 53 goal 70 FOTO 01/11/23: 56%    COGNITION: Overall cognitive status: Within functional limits for tasks assessed     SENSATION: WFL  MUSCLE LENGTH: Hamstrings: sitting wfl   POSTURE:  left higher than right Pt wearing right shoe orthotic due to pronation  PALPATION: Slight TTP bilat hip about bursa area  LUMBAR ROM:  WFL  LOWER EXTREMITY STRENGTH     MMT Right eval Left eval Rt/Lt  1/2 R / L 1/21  Hip flexion 48.1 45.0    Hip extension      Hip abduction 24.8 25.5 37.2/40.1 34.2 / 36.2  Hip adduction      Hip internal rotation      Hip external rotation      Knee flexion      Knee extension 45.7 46.6    Ankle dorsiflexion      Ankle plantarflexion      Ankle inversion      Ankle eversion       (Blank rows = not tested)     FUNCTIONAL TESTS:  5 times sit to stand: 13.72 Timed up and go (TUG): 9.77   4 stage balance: passed    1/21: 5 XSTS 9.40    GAIT: Distance walked: 500 ft Assistive device utilized: None Level of assistance: Complete Independence Comments: wfl  TODAY'S TREATMENT:     02/12/23 Pt seen for aquatic therapy today.  Treatment took place in water 3.5-4.75 ft in depth at the  [facility] pool. Temp of water was 91.  Pt entered/exited the pool via stairs independently with bilat rail.   Exercises -HB carry - Noodle press  - Warrior III   - Plank - Plank on [employer] with Arm Lifts   - Plank on [employer] with Leg Lift - Sitting Balance on Pool Noodle    Pt requires the buoyancy and hydrostatic pressure of water  for support, and to offload joints by unweighting joint load by at least 50 % in navel deep water and by at least 75-80% in chest to neck deep water.  Viscosity of the water is needed for resistance of strengthening. Water current perturbations provides challenge to standing balance requiring increased core activation.    02/05/23  Exercises -HB carry - Noodle press  - Warrior III   - Plank - Plank on [employer] with Arm Lifts   - Plank on [employer] with Leg Lift - Sitting Balance on [employer]   -cycling on noodle interval training with breast stroke arm  01/30/23 Re-assess pt declined pool submersion due to cold weather  Self care: progression of pool to land. Continuation of Pliates/Yoga classes. Aquatic HEP.  Future management of chronic condition.   Treatment                            01/18/23:  Trigger Point Dry Needling  Initial Treatment: Pt instructed on Dry Needling rational, procedures, and possible side effects. Pt instructed to expect mild to moderate muscle soreness later in the day and/or into the next day.  Pt instructed in methods to reduce muscle soreness. Pt instructed to continue prescribed HEP. Because Dry Needling was performed over or adjacent to a lung field, pt was educated on S/S of pneumothorax and to seek immediate medical attention should they occur.  Patient was educated on signs and symptoms of infection and other risk factors and advised to seek medical attention should they occur.  Patient verbalized understanding of these instructions and education.   Patient Verbal Consent  Given: Yes Education Handout Provided: Yes Muscles Treated: bilateral upper trap & levator Electrical Stimulation Performed: No Treatment Response/Outcome: significant twitch with decreased concordant pain  STM suboccipitals Cervical traction Supine chin tuck Supine T pec stretch Discussed driving & pain management   01/14/22                                                                                                                              * walking forward backward with reciprocal arm swing - multiple laps  * side stepping with rainbow arm addct/ abdct  * Single leg supermans x 10 each, not touching down between reps *Plank with arm raises x 8; leg lifts x 8 *suspended sup using blue HB holding for 10s x3 for isometric abd strengthening. A few trials required for proper execution with vc and demonstration from deck.  - suspended prone caused LBP so discontinued. * straddling noodle: cycling multiple laps with breast stroke arms, varying speed, cross country ski, suspended jumping jacks * seated balance on yellow noodle (like swing) bilat ue elevated x 10 s - hands on knees x 20s.  - sitting balance supported above noodle x 3 holding x 10s.  Reverse plank x 1 trial with difficulty    Treatment  1/2:  Hooklying progression: ab set, heel slides, bicycle from TT Qped bird dog with foot slide Qped hip hike- Lt knee on airex Tall kneel glut + ab set; + lean back; bridge; bridge + OH reach Sidelying: oblique engagement + clam, + hip abd, +hip abd with flexion/ext; side plank elbow/knee SLS- +alt pull fwd green tband post anchor   Treatment                            12/16:  Hooklying ab set for rib flare reduction Progression to hundreds- UE only, +EL 90, +head lift Qped ab set Prone STM to mid-thoracic paraspinals Seated rib cage depression and breathing into post rib cage.    PATIENT EDUCATION:  Education details: Intro to aquatic therapy   Person educated: Patient Education method: Explanation Education comprehension: verbalized understanding  HOME EXERCISE PROGRAM: Rib flare reduction in pilates [id number]  Aquatic  This aquatic home exercise program from [employer] utilizes pictures from land based exercises, but has been adapted prior to lamination and issuance.   Access Code: [email or web address] URL: [email or web address] Date: 02/05/2023 Prepared by: Geni Bers  Exercises - Hand Buoy Carry  - 1 x daily - 1-3 x weekly - 1-2 sets - 10 reps - Side lunge with hand buoys  - 1 x daily - 1-3 x weekly - 1-2 sets - 10 reps - Noodle press  - 1 x daily - 1-3 x weekly - 1-2 sets - 10 reps - Warrior III  - 1 x daily - 1-3 x weekly - 1-2 sets - 10 reps - Plank on [employer] with Arm Lifts  - 1 x daily - 1-2 x weekly - 1-2 sets - 10 reps - Plank on Long Hand Float  - 1 x daily - 1-3 x weekly - 1 sets - 3 reps - 20-30s hold - Plank on Long Hand Float with Leg Lift  - 1 x daily - 1-2 x weekly - 1-2 sets - 10 reps - Sitting Balance on Pool Noodle  - 1 x daily - 1-3 x weekly - Seated Straddle on Flotation Forward Breast Stroke Arms and Bicycle Legs  - 1 x daily - 1-3 x weekly ISSUED ASSESSMENT:  CLINICAL IMPRESSION: Pt instructed through and issued final aquatic HEP.  She demonstrates and verbalized indep with.  Instruction on adaptations to increase and decrease intensity of program as well as adding cardio component. She will continue with land based intervention to continued towards meeting all LTGs.  She has reached her max potential in setting.   PN: Has made steady but slow improvement.  She has improved in muscle strength meeting goal. She has also improved in her functional testing/ 5 x STS test reaching MDC/demonstrating statistically significant improvement. She continues to have high levels of pain which is reduced with submersion during aquatic sessions but has not yet had a residual effect lasting or overall  reduction.  She will  benefit from continued PT but will focus land based.  Plan to see her in pool x 2 more visits to create and finalize aquatic HEP.      OBJECTIVE IMPAIRMENTS: decreased knowledge of condition, decreased strength, and pain.    PARTICIPATION LIMITATIONS: driving    REHAB POTENTIAL: Good  CLINICAL DECISION MAKING: Evolving/moderate complexity  EVALUATION COMPLEXITY: Moderate   GOALS: Goals reviewed with patient? Yes  SHORT TERM GOALS: Target date: 12/27/22  Pt will tolerate full aquatic sessions consistently without increase  in pain and with improving function to demonstrate good toleration and effectiveness of intervention.  Baseline: Goal status: Met 12/14/22  2.  Pt will be indep with aquatic HEP and have decided upon value of intervention to gain pool access. Baseline:  Goal status: In progress - 12/22/22  3.  Pt will report decrease of max pain to </= to 5/10 for improved daily function Baseline: 8/10 max,  daily=5/10 Goal status: In Progress -12/22/22/ In progress 01/30/23    LONG TERM GOALS: Target date: 04/06/23  Pt to meet stated Foto Goal 70 Baseline: 53 Goal status: ongoing 01/30/23  2.  Pt will be indep with final HEP's (land and aquatic as appropriate) for continued management of condition Baseline:  Goal status: ongoing 01/30/23; Aquatic HEP issued 02/12/23  3.  Pt will improve hip abd strength  by 10 lbs to demonstrate improved overall physical function Baseline: See chart  Goal status: achieved 01/30/23  4.  Pt will report a decrease in normal pain to </=3/10 for improved functional ability/toleration Baseline: 5/10, never below a 5 Goal status: ongoing 01/30/23  5.  Pt will report return to daily (weekly) exercise regimen for improved overall health and well being  Baseline:  Goal status: in progress 01/30/23    PLAN:  PT FREQUENCY: 1xweek  PT DURATION:8 visits  PLANNED INTERVENTIONS: 97164- PT Re-evaluation, 97110-Therapeutic  exercises, 97530- Therapeutic activity, 97112- Neuromuscular re-education, 97535- Self Care, [address]- Manual therapy, [phone]- Gait training, [phone]- Orthotic Fit/training, [phone]- Aquatic Therapy, [phone]- Electrical stimulation (unattended), Patient/Family education, Balance training, Stair training, Taping, Dry Needling, Joint mobilization, Spinal manipulation, DME instructions, Cryotherapy, and Moist heat.  PLAN FOR NEXT SESSION: ERO   Rushie Chestnut) Caylea Foronda MPT 02/12/23 10:18 AM [facility] [address] [place], [place], [address] Phone: [phone]   Fax:  [phone]

## 2023-02-13 ENCOUNTER — Encounter: Payer: Self-pay | Admitting: *Deleted

## 2023-02-13 ENCOUNTER — Ambulatory Visit (HOSPITAL_BASED_OUTPATIENT_CLINIC_OR_DEPARTMENT_OTHER): Payer: BC Managed Care – PPO | Admitting: Physical Therapy

## 2023-03-02 DIAGNOSIS — N951 Menopausal and female climacteric states: Secondary | ICD-10-CM | POA: Diagnosis not present

## 2023-03-02 DIAGNOSIS — R5383 Other fatigue: Secondary | ICD-10-CM | POA: Diagnosis not present

## 2023-03-05 NOTE — Progress Notes (Deleted)
  [facility] [address] [place] [address] Phone: [phone] Subjective:    I'm seeing this patient by the request  of:  Deeann Saint, MD  CC: Back and neck pain follow-up  [id number]  Patricia Lloyd is a 63 y.o. female coming in with complaint of back and neck pain. OMT 01/23/2023. Patient states   Medications patient has been prescribed: None  Taking:         Reviewed prior external information including notes and imaging from previsou exam, outside providers and external EMR if available.   As well as notes that were available from care everywhere and other healthcare systems.  Past medical history, social, surgical and family history all reviewed in electronic medical record.  No pertanent information unless stated regarding to the chief complaint.   Past Medical History:  Diagnosis Date   Fibromyalgia    H/O fatigue    Insomnia    PONV (postoperative nausea and vomiting)    severe    No Known Allergies   Review of Systems:  No headache, visual changes, nausea, vomiting, diarrhea, constipation, dizziness, abdominal pain, skin rash, fevers, chills, night sweats, weight loss, swollen lymph nodes, body aches, joint swelling, chest pain, shortness of breath, mood changes. POSITIVE muscle aches  Objective  Last menstrual period 12/12/2012.   General: No apparent distress alert and oriented x3 mood and affect normal, dressed appropriately.  HEENT: Pupils equal, extraocular movements intact  Respiratory: Patient's speak in full sentences and does not appear short of breath  Cardiovascular: No lower extremity edema, non tender, no erythema  MSK:  Back does have some loss lordosis noted.  Osteopathic findings  C2 flexed rotated and side bent right C6 flexed rotated and side bent left T3 extended rotated and side bent right inhaled rib T9 extended rotated and side bent left L2 flexed rotated and side bent  right Sacrum right on right     Assessment and Plan:  No problem-specific Assessment & Plan notes found for this encounter.    Nonallopathic problems  Decision today to treat with OMT was based on Physical Exam  After verbal consent patient was treated with HVLA, ME, FPR techniques in cervical, rib, thoracic, lumbar, and sacral  areas  Patient tolerated the procedure well with improvement in symptoms  Patient given exercises, stretches and lifestyle modifications  See medications in patient instructions if given  Patient will follow up in 4-8 weeks     The above documentation has been reviewed and is accurate and complete Judi Saa, DO         Note: This dictation was prepared with Dragon dictation along with smaller phrase technology. Any transcriptional errors that result from this process are unintentional.

## 2023-03-06 ENCOUNTER — Ambulatory Visit: Payer: BC Managed Care – PPO | Admitting: Family Medicine

## 2023-03-06 DIAGNOSIS — Z0001 Encounter for general adult medical examination with abnormal findings: Secondary | ICD-10-CM | POA: Diagnosis not present

## 2023-03-07 NOTE — Progress Notes (Unsigned)
  [facility] [address] [place] [address] Phone: [phone] Subjective:   Patricia Lloyd, am serving as a scribe for Dr. Antoine Primas.  I'm seeing this patient by the request  of:  Deeann Saint, MD  CC: Back and neck pain follow-up  [id number]  Patricia Lloyd is a 63 y.o. female coming in with complaint of back and neck pain. OMT 01/23/2023. Patient states R knee may need to drain the cyst. Feels unsteady.  Patient feels like the knee is giving her more discomfort as well.  Medications patient has been prescribed: None  Taking:         Reviewed prior external information including notes and imaging from previsou exam, outside providers and external EMR if available.   As well as notes that were available from care everywhere and other healthcare systems.  Past medical history, social, surgical and family history all reviewed in electronic medical record.  No pertanent information unless stated regarding to the chief complaint.   Past Medical History:  Diagnosis Date   Fibromyalgia    H/O fatigue    Insomnia    PONV (postoperative nausea and vomiting)    severe    No Known Allergies   Review of Systems:  No headache, visual changes, nausea, vomiting, diarrhea, constipation, dizziness, abdominal pain, skin rash, fevers, chills, night sweats, weight loss, swollen lymph nodes, body aches, joint swelling, chest pain, shortness of breath, mood changes. POSITIVE muscle aches  Objective  Blood pressure (!) 122/90, pulse 70, height 5\' 4"  (1.626 m), weight 147 lb (66.7 kg), last menstrual period 12/12/2012, SpO2 97%.   General: No apparent distress alert and oriented x3 mood and affect normal, dressed appropriately.  HEENT: Pupils equal, extraocular movements intact  Respiratory: Patient's speak in full sentences and does not appear short of breath  Cardiovascular: No lower extremity edema, non tender, no erythema  Gait  mild antalgic favoring the right leg MSK:  Back does have some loss lordosis noted.  Some tenderness to palpation in the paraspinal musculature.  Osteopathic findings  C2 flexed rotated and side bent right C7 flexed rotated and side bent right T3 extended rotated and side bent right inhaled rib T9 extended rotated and side bent left L2 flexed rotated and side bent right Sacrum right on right  Limited muscular skeletal ultrasound was performed and interpreted by Antoine Primas, M   Limited ultrasound shows that patient does have some hypoechoic changes noted in the popliteal area but unfortunately it appears to be a ruptured Baker's cyst.  Underlying arthritic changes otherwise noted. Impression: Ruptured Baker's cyst     Assessment and Plan:  No problem-specific Assessment & Plan notes found for this encounter.    Nonallopathic problems  Decision today to treat with OMT was based on Physical Exam  After verbal consent patient was treated with HVLA, ME, FPR techniques in cervical, rib, thoracic, lumbar, and sacral  areas  Patient tolerated the procedure well with improvement in symptoms  Patient given exercises, stretches and lifestyle modifications  See medications in patient instructions if given  Patient will follow up in 4-8 weeks    The above documentation has been reviewed and is accurate and complete Judi Saa, DO          Note: This dictation was prepared with Dragon dictation along with smaller phrase technology. Any transcriptional errors that result from this process are unintentional.

## 2023-03-09 ENCOUNTER — Ambulatory Visit (INDEPENDENT_AMBULATORY_CARE_PROVIDER_SITE_OTHER): Payer: BC Managed Care – PPO | Admitting: Family Medicine

## 2023-03-09 ENCOUNTER — Other Ambulatory Visit: Payer: Self-pay

## 2023-03-09 ENCOUNTER — Encounter: Payer: Self-pay | Admitting: Family Medicine

## 2023-03-09 VITALS — BP 122/90 | HR 70 | Ht 64.0 in | Wt 147.0 lb

## 2023-03-09 DIAGNOSIS — M9903 Segmental and somatic dysfunction of lumbar region: Secondary | ICD-10-CM

## 2023-03-09 DIAGNOSIS — M9901 Segmental and somatic dysfunction of cervical region: Secondary | ICD-10-CM | POA: Diagnosis not present

## 2023-03-09 DIAGNOSIS — F4323 Adjustment disorder with mixed anxiety and depressed mood: Secondary | ICD-10-CM | POA: Diagnosis not present

## 2023-03-09 DIAGNOSIS — M25561 Pain in right knee: Secondary | ICD-10-CM | POA: Diagnosis not present

## 2023-03-09 DIAGNOSIS — M660 Rupture of popliteal cyst: Secondary | ICD-10-CM

## 2023-03-09 DIAGNOSIS — M9902 Segmental and somatic dysfunction of thoracic region: Secondary | ICD-10-CM

## 2023-03-09 DIAGNOSIS — M5416 Radiculopathy, lumbar region: Secondary | ICD-10-CM

## 2023-03-09 DIAGNOSIS — M9904 Segmental and somatic dysfunction of sacral region: Secondary | ICD-10-CM | POA: Diagnosis not present

## 2023-03-09 DIAGNOSIS — M25562 Pain in left knee: Secondary | ICD-10-CM

## 2023-03-09 DIAGNOSIS — M9908 Segmental and somatic dysfunction of rib cage: Secondary | ICD-10-CM

## 2023-03-09 NOTE — Assessment & Plan Note (Signed)
 Ruptured Baker's cyst noted.  Discussed which activities to do and which ones to avoid.  Increase activity slowly.  Discussed compression.  Will check again in 6 to 8 weeks

## 2023-03-09 NOTE — Assessment & Plan Note (Signed)
 Discussed with patient on icing regimen and home exercises.  Increase activity slowly.  Discussed icing regimen.  Has responded well to osteopathic manipulation.  Increase activity slowly.  Follow-up again in 6 to 8 weeks

## 2023-03-12 ENCOUNTER — Encounter (HOSPITAL_BASED_OUTPATIENT_CLINIC_OR_DEPARTMENT_OTHER): Payer: Self-pay | Admitting: Physical Therapy

## 2023-03-12 ENCOUNTER — Ambulatory Visit (HOSPITAL_BASED_OUTPATIENT_CLINIC_OR_DEPARTMENT_OTHER): Payer: BC Managed Care – PPO | Attending: Sports Medicine | Admitting: Physical Therapy

## 2023-03-12 DIAGNOSIS — M6281 Muscle weakness (generalized): Secondary | ICD-10-CM | POA: Diagnosis not present

## 2023-03-12 DIAGNOSIS — R293 Abnormal posture: Secondary | ICD-10-CM | POA: Diagnosis not present

## 2023-03-12 DIAGNOSIS — M5459 Other low back pain: Secondary | ICD-10-CM | POA: Insufficient documentation

## 2023-03-12 NOTE — Therapy (Signed)
 OUTPATIENT PHYSICAL THERAPY THORACOLUMBAR TREATMENT    Patient Name: Patricia Lloyd MRN: [id number] DOB:02-26-1960, 63 y.o., female Today's Date: 03/12/2023  END OF SESSION:  PT End of Session - 03/12/23 1142     Visit Number 14    Number of Visits 19    Date for PT Re-Evaluation 04/06/23    Authorization Type BCBS    Authorization Time Period 2/5-4/5    Authorization - Visit Number 1    Authorization - Number of Visits 6    PT Start Time 1143    PT Stop Time 1225    PT Time Calculation (min) 42 min    Activity Tolerance Patient tolerated treatment well    Behavior During Therapy WFL for tasks assessed/performed                  Past Medical History:  Diagnosis Date   Fibromyalgia    H/O fatigue    Insomnia    PONV (postoperative nausea and vomiting)    severe   Past Surgical History:  Procedure Laterality Date   CESAREAN SECTION  1995   CESAREAN SECTION  2000   DILATATION & CURETTAGE/HYSTEROSCOPY WITH TRUECLEAR N/A 01/01/2013   Procedure: DILATATION & CURETTAGE/HYSTEROSCOPY WITH TRUCLEAR;  Surgeon: Meriel Pica, MD;  Location: [facility];  Service: Gynecology;  Laterality: N/A;   SHOULDER SURGERY  05/03/2010   Left shoulder surgery, Dr.Doldorf     TMJ ARTHROPLASTY  1990   Patient Active Problem List   Diagnosis Date Noted   Ruptured Bakers cyst 03/09/2023   Aortic root enlargement (HCC) 12/14/2022   Somatic dysfunction of spine, sacral 10/17/2022   Cervical disc disorder with radiculopathy of cervical region 04/27/2021   Lumbar radiculopathy 04/27/2021   Dyslipidemia 03/02/2021   Fracture of one rib of left side with delayed healing 02/19/2020   Lateral pain of left hip 07/15/2019   Precordial chest pain 01/31/2019   Palpitation 01/30/2019   Educated about COVID-19 virus infection 01/30/2019   Urinary incontinence 01/20/2019   Loss of transverse plantar arch of right foot 07/02/2018   Peroneal tendinitis, right 07/02/2018   Morton neuroma,  right 07/02/2018   Sinusitis, acute 04/21/2015   Cervical radiculitis 09/03/2014   Left arm pain 09/02/2014   Strain of right tibialis anterior muscle 06/24/2014   Tendonitis of left rotator cuff 06/24/2014   Fibromyalgia 06/10/2014   Varicose veins of bilateral lower extremities with other complications 01/28/2013   Leg edema, right 01/28/2013   Dizziness and giddiness 12/25/2012    PCP:   Enid Baas, MD    REFERRING PROVIDER: Judi Saa, DO /   Enid Baas, MD    REFERRING DIAG:  Scoliosis of lumbar spine, unspecified scoliosis type  M51.369 (ICD-10-CM) - Degeneration of intervertebral disc of lumbar region without discogenic back pain or lower extremity pain    Lumbar radiculopathy  M25.552 (ICD-10-CM) - Lateral pain of left hip    Rationale for Evaluation and Treatment: Rehabilitation  THERAPY DIAG:  Other low back pain  Muscle weakness (generalized)  Abnormal posture  ONSET DATE: increasing over then last year  SUBJECTIVE:  SUBJECTIVE STATEMENT:  Enjoyed the pool, I think it is good for me. On prednisone for an adrenal thing, cannot tolerate it.   POOL ACCESS:  Pt plans to join [employer] at d/c.   PERTINENT HISTORY:  -Foot pain on the right and symptoms consistent with a -Morton's neuroma  -bilateral hip pain that hurts in her groin/ MRIs which showed some labral pathology in the left hip and some mild arthritis of both hip joints -significant scoliosis with a convex curve    PAIN:  Are you having pain? Yes: NPRS scale: Current 6/10- Rt hip/knee,  L hip 6/10  Pain location: see above Pain description: constant in left lumbar spine and hip Aggravating factors: walking; standing, pilates  Relieving factors: nothing  PRECAUTIONS: None  RED FLAGS: None   WEIGHT  BEARING RESTRICTIONS: No  FALLS:  Has patient fallen in last 6 months? 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  DIAGNOSTIC FINDINGS:  Mild levoscoliosis of lumbar spine is noted. Minimal grade 1  retrolisthesis of L1-2 is noted secondary to severe degenerative  disc disease at this level. Mild grade 1 anterolisthesis of L4-5 is  noted secondary to posterior facet joint hypertrophy. Moderate  degenerative disc disease is noted at L5-S1.   PATIENT SURVEYS:  FOTO Primary score: 68% risk adjusted 53 goal 70 FOTO 01/11/23: 56%    COGNITION: Overall cognitive status: Within functional limits for tasks assessed     SENSATION: WFL  MUSCLE LENGTH: Hamstrings: sitting wfl   POSTURE:  left higher than right Pt wearing right shoe orthotic due to pronation  PALPATION: Slight TTP bilat hip about bursa area  LUMBAR ROM:  WFL  LOWER EXTREMITY STRENGTH     MMT Right eval Left eval Rt/Lt  1/2 R / L 1/21  Hip flexion 48.1 45.0    Hip extension      Hip abduction 24.8 25.5 37.2/40.1 34.2 / 36.2  Hip adduction      Hip internal rotation      Hip external rotation      Knee flexion      Knee extension 45.7 46.6    Ankle dorsiflexion      Ankle plantarflexion      Ankle inversion      Ankle eversion       (Blank rows = not tested)     FUNCTIONAL TESTS:  5 times sit to stand: 13.72 Timed up and go (TUG): 9.77   4 stage balance: passed    1/21: 5 XSTS 9.40    GAIT: Distance walked: 500 ft Assistive device utilized: None Level of assistance: Complete Independence Comments: wfl  TODAY'S TREATMENT:     Treatment                             3/3: Blank lines following charge title = not provided on this treatment date.   Manual:  TPDN No  There-ex: Quick review of HEP Tall kneeling shoulder flexion, Y Tall kneeling forward hip hinge Stanidng hip hinge Mod single leg RDL 10lb There-Act:  Self Care:  Nuro-Re-ed:  Gait Training:    02/12/23 Pt seen for aquatic  therapy today.  Treatment took place in water 3.5-4.75 ft in depth at the [facility] pool. Temp of water was 91.  Pt entered/exited the pool via stairs independently with bilat rail.   Exercises -HB carry - Noodle press  - Warrior III   - Plank - Plank on Long Psychologist, sport and exercise with Arm Lifts   - Plank on [employer] with Leg Lift - Sitting Balance on Pool Noodle    Pt requires the buoyancy and hydrostatic pressure of water for support, and to offload joints by unweighting joint load by at least 50 % in navel deep water and by at least 75-80% in chest to neck deep water.  Viscosity of the water is needed for resistance of strengthening. Water current perturbations provides challenge to standing balance requiring increased core activation.    PATIENT EDUCATION:  Education details: Intro to aquatic therapy  Person educated: Patient Education method: Explanation Education comprehension: verbalized understanding  HOME EXERCISE PROGRAM: Rib flare reduction in pilates [id number]  Aquatic  This aquatic home exercise program from [employer] utilizes pictures from land based exercises, but has been adapted prior to lamination and issuance.  Access Code: [email or web address] URL: [email or web address] Date: 02/05/2023 Prepared by: Geni Bers   ASSESSMENT:  CLINICAL IMPRESSION: Focus today on progressing strength challenges for core awareness and midline lumbopelvic stability. Good tolerance with fatigue as expected.    PN: Has made steady but slow improvement.  She has improved in muscle strength meeting goal. She has  also improved in her functional testing/ 5 x STS test reaching MDC/demonstrating statistically significant improvement. She continues to have high levels of pain which is reduced with submersion during aquatic sessions but has not yet had a residual effect lasting or overall reduction.  She will  benefit from continued PT but will focus land based.  Plan to see her in pool x 2 more visits to create and finalize aquatic HEP.      OBJECTIVE IMPAIRMENTS: decreased knowledge of condition, decreased strength, and pain.    PARTICIPATION LIMITATIONS: driving    REHAB POTENTIAL: Good  CLINICAL DECISION MAKING: Evolving/moderate complexity  EVALUATION COMPLEXITY: Moderate   GOALS: Goals reviewed with patient? Yes  SHORT TERM GOALS: Target date: 12/27/22  Pt will tolerate full aquatic sessions consistently without increase in pain and with improving function to demonstrate good toleration and effectiveness of intervention.  Baseline: Goal status: Met 12/14/22  2.  Pt will be indep with aquatic HEP and have decided upon value of intervention to gain pool access. Baseline:  Goal status: In progress - 12/22/22  3.  Pt will report decrease of max pain to </= to 5/10 for improved daily function Baseline: 8/10 max,  daily=5/10 Goal status: In Progress -12/22/22/ In progress 01/30/23    LONG TERM GOALS: Target date: 04/06/23- not discussed on 3/3 due to being on prednisone  Pt to meet stated Foto Goal 70 Baseline: 53 Goal status: ongoing 01/30/23  2.  Pt will be indep with final HEP's (land and aquatic as appropriate) for continued management of condition Baseline:  Goal status: ongoing 01/30/23; Aquatic HEP issued 02/12/23  3.  Pt will improve hip abd strength  by 10 lbs to demonstrate improved overall physical function Baseline: See chart  Goal status: achieved 01/30/23  4.  Pt will report a decrease in normal pain to </=3/10 for improved functional ability/toleration Baseline: 5/10,  never below a 5 Goal status: ongoing 01/30/23  5.  Pt will report return to daily (weekly) exercise  regimen for improved overall health and well being  Baseline:  Goal status: in progress 01/30/23    PLAN:  PT FREQUENCY: 1xweek  PT DURATION:8 visits  PLANNED INTERVENTIONS: 97164- PT Re-evaluation, 97110-Therapeutic exercises, 97530- Therapeutic activity, 97112- Neuromuscular re-education, 97535- Self Care, 16109- Manual therapy, [phone]- Gait training, [phone]- Orthotic Fit/training, [phone]- Aquatic Therapy, [phone]- Electrical stimulation (unattended), Patient/Family education, Balance training, Stair training, Taping, Dry Needling, Joint mobilization, Spinal manipulation, DME instructions, Cryotherapy, and Moist heat.  PLAN FOR NEXT SESSION: continue gross strength. Test LE strength   Indira Sorenson C. Londen Lorge PT, DPT 03/12/23 12:29 PM  [facility] [address] [place], [place], [address] Phone: [phone]   Fax:  [phone]

## 2023-03-16 ENCOUNTER — Ambulatory Visit: Payer: BC Managed Care – PPO | Admitting: Cardiology

## 2023-03-19 ENCOUNTER — Ambulatory Visit (HOSPITAL_BASED_OUTPATIENT_CLINIC_OR_DEPARTMENT_OTHER): Payer: BC Managed Care – PPO | Admitting: Physical Therapy

## 2023-03-19 ENCOUNTER — Encounter (HOSPITAL_BASED_OUTPATIENT_CLINIC_OR_DEPARTMENT_OTHER): Payer: Self-pay | Admitting: Physical Therapy

## 2023-03-19 DIAGNOSIS — M5459 Other low back pain: Secondary | ICD-10-CM | POA: Diagnosis not present

## 2023-03-19 DIAGNOSIS — R293 Abnormal posture: Secondary | ICD-10-CM | POA: Diagnosis not present

## 2023-03-19 DIAGNOSIS — M6281 Muscle weakness (generalized): Secondary | ICD-10-CM | POA: Diagnosis not present

## 2023-03-19 NOTE — Therapy (Signed)
 OUTPATIENT PHYSICAL THERAPY THORACOLUMBAR TREATMENT    Patient Name: Patricia Lloyd MRN: [id number] DOB:August 05, 1960, 63 y.o., female Today's Date: 03/19/2023  END OF SESSION:  PT End of Session - 03/19/23 1148     Visit Number 15    Number of Visits 19    Date for PT Re-Evaluation 04/06/23    Authorization Type BCBS    Authorization Time Period 2/5-4/5    Authorization - Visit Number 2    Authorization - Number of Visits 6    PT Start Time 1148    PT Stop Time 1228    PT Time Calculation (min) 40 min    Activity Tolerance Patient tolerated treatment well    Behavior During Therapy WFL for tasks assessed/performed                  Past Medical History:  Diagnosis Date   Fibromyalgia    H/O fatigue    Insomnia    PONV (postoperative nausea and vomiting)    severe   Past Surgical History:  Procedure Laterality Date   CESAREAN SECTION  1995   CESAREAN SECTION  2000   DILATATION & CURETTAGE/HYSTEROSCOPY WITH TRUECLEAR N/A 01/01/2013   Procedure: DILATATION & CURETTAGE/HYSTEROSCOPY WITH TRUCLEAR;  Surgeon: Meriel Pica, MD;  Location: [facility];  Service: Gynecology;  Laterality: N/A;   SHOULDER SURGERY  05/03/2010   Left shoulder surgery, Dr.Doldorf     TMJ ARTHROPLASTY  1990   Patient Active Problem List   Diagnosis Date Noted   Ruptured Bakers cyst 03/09/2023   Aortic root enlargement (HCC) 12/14/2022   Somatic dysfunction of spine, sacral 10/17/2022   Cervical disc disorder with radiculopathy of cervical region 04/27/2021   Lumbar radiculopathy 04/27/2021   Dyslipidemia 03/02/2021   Fracture of one rib of left side with delayed healing 02/19/2020   Lateral pain of left hip 07/15/2019   Precordial chest pain 01/31/2019   Palpitation 01/30/2019   Educated about COVID-19 virus infection 01/30/2019   Urinary incontinence 01/20/2019   Loss of transverse plantar arch of right foot 07/02/2018   Peroneal tendinitis, right 07/02/2018   Morton neuroma,  right 07/02/2018   Sinusitis, acute 04/21/2015   Cervical radiculitis 09/03/2014   Left arm pain 09/02/2014   Strain of right tibialis anterior muscle 06/24/2014   Tendonitis of left rotator cuff 06/24/2014   Fibromyalgia 06/10/2014   Varicose veins of bilateral lower extremities with other complications 01/28/2013   Leg edema, right 01/28/2013   Dizziness and giddiness 12/25/2012    PCP:   Enid Baas, MD    REFERRING PROVIDER: Judi Saa, DO /   Enid Baas, MD    REFERRING DIAG:  Scoliosis of lumbar spine, unspecified scoliosis type  M51.369 (ICD-10-CM) - Degeneration of intervertebral disc of lumbar region without discogenic back pain or lower extremity pain    Lumbar radiculopathy  M25.552 (ICD-10-CM) - Lateral pain of left hip    Rationale for Evaluation and Treatment: Rehabilitation  THERAPY DIAG:  Other low back pain  Muscle weakness (generalized)  Abnormal posture  ONSET DATE: increasing over then last year  SUBJECTIVE:  SUBJECTIVE STATEMENT:  Bird dog exercise does not work for me. Did not finish the prednisone.   POOL ACCESS:  Pt plans to join [employer] at d/c.   PERTINENT HISTORY:  -Foot pain on the right and symptoms consistent with a -Morton's neuroma  -bilateral hip pain that hurts in her groin/ MRIs which showed some labral pathology in the left hip and some mild arthritis of both hip joints -significant scoliosis with a convex curve    PAIN:  Are you having pain? Yes: NPRS scale: Current 6/10- Rt hip/knee,  L hip 6/10  Pain location: see above Pain description: constant in left lumbar spine and hip Aggravating factors: walking; standing, pilates  Relieving factors: nothing  PRECAUTIONS: None  RED FLAGS: None   WEIGHT BEARING RESTRICTIONS:  No  FALLS:  Has patient fallen in last 6 months? 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  DIAGNOSTIC FINDINGS:  Mild levoscoliosis of lumbar spine is noted. Minimal grade 1  retrolisthesis of L1-2 is noted secondary to severe degenerative  disc disease at this level. Mild grade 1 anterolisthesis of L4-5 is  noted secondary to posterior facet joint hypertrophy. Moderate  degenerative disc disease is noted at L5-S1.   PATIENT SURVEYS:  FOTO Primary score: 68% risk adjusted 53 goal 70 FOTO 01/11/23: 56%    COGNITION: Overall cognitive status: Within functional limits for tasks assessed     SENSATION: WFL  MUSCLE LENGTH: Hamstrings: sitting wfl   POSTURE:  left higher than right Pt wearing right shoe orthotic due to pronation  PALPATION: Slight TTP bilat hip about bursa area  LUMBAR ROM:  WFL  LOWER EXTREMITY STRENGTH     MMT Right eval Left eval Rt/Lt  1/2 R / L 1/21 Rt/Lt 3/10  Hip flexion 48.1 45.0     Hip extension       Hip abduction 24.8 25.5 37.2/40.1 34.2 / 36.2 36.8/35.1  Hip adduction       Hip internal rotation       Hip external rotation       Knee flexion       Knee extension 45.7 46.6     Ankle dorsiflexion       Ankle plantarflexion       Ankle inversion       Ankle eversion        (Blank rows = not tested)     FUNCTIONAL TESTS:  5 times sit to stand: 13.72 Timed up and go (TUG): 9.77   4 stage balance: passed    1/21: 5 XSTS 9.40    GAIT: Distance walked: 500 ft Assistive device utilized: None Level of assistance: Complete Independence Comments: wfl  TODAY'S TREATMENT:      Treatment                            3/10: Blank lines following charge title = not provided on this treatment date.   Manual:  TPDN YES Trigger Point Dry Needling  Subsequent Treatment: Instructions provided previously at initial dry needling treatment.   Patient Verbal Consent Given: Yes Education Handout Provided: Previously Provided Muscles Treated: Lt glut med, piriformis,  TFL Electrical Stimulation Performed: No Treatment Response/Outcome: twitch with decreased spasm  There-ex: MET for pelvic rotation (Lt flexors/Rt extensors) followed by isometric adduction with ab set Supine firgure 4 with towel roll for gapping Supine butterfly with core engagement Bridge with ball bw knees, on heels There-Act:  Self Care:  Nuro-Re-ed:  Gait Training: Gait analysis following DN & MET   Treatment                            3/3: Blank lines following charge title = not provided on this treatment date.   Manual:  TPDN No  There-ex: Quick review of HEP Tall kneeling shoulder flexion, Y Tall kneeling forward hip hinge Stanidng hip hinge Mod single leg RDL 10lb There-Act:  Self Care:  Nuro-Re-ed:  Gait Training:    02/12/23 Pt seen for aquatic therapy today.  Treatment took place in water 3.5-4.75 ft in depth at the [facility] pool. Temp of water was 91.  Pt entered/exited the pool via stairs independently with bilat rail.   Exercises -HB carry - Noodle press  - Warrior III   - Plank - Plank on Long Psychologist, sport and exercise with Arm Lifts   - Plank on [employer] with Leg Lift - Sitting Balance on Pool Noodle    Pt requires the buoyancy and hydrostatic pressure of water for support, and to offload joints by unweighting joint load by at least 50 % in navel deep water and by at least 75-80% in chest to neck deep water.  Viscosity of the water is needed for resistance of strengthening. Water current perturbations provides challenge to standing balance requiring  increased core activation.    PATIENT EDUCATION:  Education details: Intro to aquatic therapy  Person educated: Patient Education method: Explanation Education comprehension: verbalized understanding  HOME EXERCISE PROGRAM: Rib flare reduction in pilates [id number]  Aquatic  This aquatic home exercise program from [employer] utilizes pictures from land based exercises, but has been adapted prior to lamination and issuance.  Access Code: [email or web address] URL: [email or web address] Date: 02/05/2023 Prepared by: Geni Bers   ASSESSMENT:  CLINICAL IMPRESSION: S/s consistent with innominate rotation resulting in lateral left hip spasm. Able to decrease concordant pain with DN that resulted in soreness as expected. Strength is holding well in hip abd.    PN: Has made steady but slow improvement.  She has improved in muscle strength meeting goal. She has also improved in her functional testing/ 5 x STS test reaching MDC/demonstrating statistically significant improvement. She continues to have high levels of pain which is reduced with submersion during aquatic sessions but has not yet had a residual effect lasting or overall reduction.  She will  benefit from continued PT but will focus land based.  Plan to see her in pool x 2 more visits to create and finalize aquatic HEP.      OBJECTIVE IMPAIRMENTS: decreased knowledge of condition, decreased strength, and pain.    PARTICIPATION LIMITATIONS: driving    REHAB POTENTIAL: Good  CLINICAL DECISION MAKING: Evolving/moderate complexity  EVALUATION COMPLEXITY: Moderate   GOALS: Goals reviewed with patient? Yes  SHORT TERM GOALS: Target date: 12/27/22  Pt will tolerate full aquatic sessions consistently without increase in pain and with improving function to demonstrate good toleration and effectiveness of intervention.  Baseline: Goal status: Met 12/14/22  2.  Pt will be indep with aquatic HEP and have decided upon value  of intervention to gain pool access. Baseline:  Goal status: In progress - 12/22/22  3.  Pt will report decrease of max pain to </= to 5/10 for improved daily function Baseline: 8/10 max,  daily=5/10 Goal status: In Progress -12/22/22/ In progress 01/30/23    LONG TERM GOALS: Target date: 04/06/23- not discussed on 3/3 due to being on prednisone  Pt to meet stated Foto Goal 70 Baseline: 53 Goal status: ongoing 01/30/23  2.  Pt will be indep with final HEP's (land and aquatic as appropriate) for continued management of condition Baseline:  Goal status: ongoing 01/30/23; Aquatic HEP issued 02/12/23  3.  Pt will improve hip abd strength  by 10 lbs to demonstrate improved overall physical function Baseline: See chart  Goal status: achieved 01/30/23  4.  Pt will report a decrease in normal pain to </=3/10 for improved functional ability/toleration Baseline: 5/10, never below a 5 Goal status: ongoing 01/30/23  5.  Pt will report return to daily (weekly) exercise regimen for improved overall health and well being  Baseline:  Goal status: in progress 01/30/23    PLAN:  PT FREQUENCY: 1xweek  PT DURATION:8 visits  PLANNED INTERVENTIONS: 97164- PT Re-evaluation, 97110-Therapeutic exercises, 97530- Therapeutic activity, 97112- Neuromuscular re-education, 97535- Self Care, 78938- Manual therapy, [phone]- Gait training, [phone]- Orthotic Fit/training, [phone]- Aquatic Therapy, [phone]- Electrical stimulation (unattended), Patient/Family education, Balance training, Stair training, Taping, Dry Needling, Joint mobilization, Spinal manipulation, DME instructions, Cryotherapy, and Moist heat.  PLAN FOR NEXT SESSION: continue gross strength.    Smaran Gaus C. Gursimran Litaker PT, DPT 03/19/23 12:43 PM  [facility] [address] [place], [place], [address] Phone: [phone]   Fax:  [phone]

## 2023-03-26 ENCOUNTER — Ambulatory Visit (HOSPITAL_BASED_OUTPATIENT_CLINIC_OR_DEPARTMENT_OTHER): Payer: BC Managed Care – PPO | Admitting: Physical Therapy

## 2023-03-29 DIAGNOSIS — M4206 Juvenile osteochondrosis of spine, lumbar region: Secondary | ICD-10-CM | POA: Diagnosis not present

## 2023-03-29 DIAGNOSIS — M19031 Primary osteoarthritis, right wrist: Secondary | ICD-10-CM | POA: Diagnosis not present

## 2023-03-29 DIAGNOSIS — M1732 Unilateral post-traumatic osteoarthritis, left knee: Secondary | ICD-10-CM | POA: Diagnosis not present

## 2023-03-29 DIAGNOSIS — M19032 Primary osteoarthritis, left wrist: Secondary | ICD-10-CM | POA: Diagnosis not present

## 2023-03-29 DIAGNOSIS — M1731 Unilateral post-traumatic osteoarthritis, right knee: Secondary | ICD-10-CM | POA: Diagnosis not present

## 2023-04-03 ENCOUNTER — Ambulatory Visit (HOSPITAL_BASED_OUTPATIENT_CLINIC_OR_DEPARTMENT_OTHER): Payer: BC Managed Care – PPO | Admitting: Physical Therapy

## 2023-04-06 ENCOUNTER — Encounter (HOSPITAL_BASED_OUTPATIENT_CLINIC_OR_DEPARTMENT_OTHER): Payer: Self-pay | Admitting: Physical Therapy

## 2023-04-09 ENCOUNTER — Ambulatory Visit (HOSPITAL_BASED_OUTPATIENT_CLINIC_OR_DEPARTMENT_OTHER): Payer: BC Managed Care – PPO | Admitting: Physical Therapy

## 2023-04-10 ENCOUNTER — Ambulatory Visit (HOSPITAL_BASED_OUTPATIENT_CLINIC_OR_DEPARTMENT_OTHER): Attending: Sports Medicine | Admitting: Physical Therapy

## 2023-04-10 ENCOUNTER — Encounter (HOSPITAL_BASED_OUTPATIENT_CLINIC_OR_DEPARTMENT_OTHER): Payer: Self-pay | Admitting: Physical Therapy

## 2023-04-10 DIAGNOSIS — M6281 Muscle weakness (generalized): Secondary | ICD-10-CM | POA: Diagnosis not present

## 2023-04-10 DIAGNOSIS — M5459 Other low back pain: Secondary | ICD-10-CM | POA: Diagnosis not present

## 2023-04-10 DIAGNOSIS — R293 Abnormal posture: Secondary | ICD-10-CM | POA: Insufficient documentation

## 2023-04-10 NOTE — Therapy (Signed)
 OUTPATIENT PHYSICAL THERAPY THORACOLUMBAR TREATMENT    Patient Name: Patricia Lloyd MRN: [id number] DOB:13-Apr-1960, 63 y.o., female Today's Date: 04/10/2023  END OF SESSION:  PT End of Session - 04/10/23 1016     Visit Number 16    Number of Visits 20    Date for PT Re-Evaluation 06/09/23    Authorization Type BC[employer]    Authorization Time Period 2/5-4/5    Authorization - Visit Number 3    Authorization - Number of Visits 6    PT Start Time 0935    PT Stop Time 1013    PT Time Calculation (min) 38 min    Activity Tolerance Patient tolerated treatment well    Behavior During Therapy WFL for tasks assessed/performed                   Past Medical History:  Diagnosis Date   Fibromyalgia    H/O fatigue    Insomnia    PONV (postoperative nausea and vomiting)    severe   Past Surgical History:  Procedure Laterality Date   CESAREAN SECTION  1995   CESAREAN SECTION  2000   DILATATION & CURETTAGE/HYSTEROSCOPY WITH TRUECLEAR N/A 01/01/2013   Procedure: DILATATION & CURETTAGE/HYSTEROSCOPY WITH TRUCLEAR;  Surgeon: Meriel Pica, MD;  Location: [facility];  Service: Gynecology;  Laterality: N/A;   SHOULDER SURGERY  05/03/2010   Left shoulder surgery, Dr.Doldorf     TMJ ARTHROPLASTY  1990   Patient Active Problem List   Diagnosis Date Noted   Ruptured Bakers cyst 03/09/2023   Aortic root enlargement (HCC) 12/14/2022   Somatic dysfunction of spine, sacral 10/17/2022   Cervical disc disorder with radiculopathy of cervical region 04/27/2021   Lumbar radiculopathy 04/27/2021   Dyslipidemia 03/02/2021   Fracture of one rib of left side with delayed healing 02/19/2020   Lateral pain of left hip 07/15/2019   Precordial chest pain 01/31/2019   Palpitation 01/30/2019   Educated about COVID-19 virus infection 01/30/2019   Urinary incontinence 01/20/2019   Loss of transverse plantar arch of right foot 07/02/2018   Peroneal tendinitis, right 07/02/2018   Morton neuroma,  right 07/02/2018   Sinusitis, acute 04/21/2015   Cervical radiculitis 09/03/2014   Left arm pain 09/02/2014   Strain of right tibialis anterior muscle 06/24/2014   Tendonitis of left rotator cuff 06/24/2014   Fibromyalgia 06/10/2014   Varicose veins of bilateral lower extremities with other complications 01/28/2013   Leg edema, right 01/28/2013   Dizziness and giddiness 12/25/2012    PCP:   Enid Baas, MD    REFERRING PROVIDER: Judi Saa, DO /   Enid Baas, MD    REFERRING DIAG:  Scoliosis of lumbar spine, unspecified scoliosis type  M51.369 (ICD-10-CM) - Degeneration of intervertebral disc of lumbar region without discogenic back pain or lower extremity pain    Lumbar radiculopathy  M25.552 (ICD-10-CM) - Lateral pain of left hip    Rationale for Evaluation and Treatment: Rehabilitation  THERAPY DIAG:  Other low back pain  Muscle weakness (generalized)  Abnormal posture  ONSET DATE: increasing over then last year  SUBJECTIVE:  SUBJECTIVE STATEMENT: Left shoulder feels dropped, bil iliac crests very tender. Lateral right lower leg hurts.   POOL ACCESS:  Pt plans to join [employer] at d/c.   PERTINENT HISTORY:  -Foot pain on the right and symptoms consistent with a -Morton's neuroma  -bilateral hip pain that hurts in her groin/ MRIs which showed some labral pathology in the left hip and some mild arthritis of both hip joints -significant scoliosis with a convex curve    PAIN:  Are you having pain? Yes: NPRS scale: Current 6/10- Rt hip/knee,  L hip 6/10  Pain location: see above Pain description: constant in left lumbar spine and hip Aggravating factors: walking; standing, pilates  Relieving factors: nothing  PRECAUTIONS: None  RED FLAGS: None   WEIGHT BEARING  RESTRICTIONS: No  FALLS:  Has patient fallen in last 6 months? 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  DIAGNOSTIC FINDINGS:  Mild levoscoliosis of lumbar spine is noted. Minimal grade 1  retrolisthesis of L1-2 is noted secondary to severe degenerative  disc disease at this level. Mild grade 1 anterolisthesis of L4-5 is  noted secondary to posterior facet joint hypertrophy. Moderate  degenerative disc disease is noted at L5-S1.   PATIENT SURVEYS:  FOTO Primary score: 68% risk adjusted 53 goal 70 FOTO 01/11/23: 56%    COGNITION: Overall cognitive status: Within functional limits for tasks assessed     SENSATION: WFL  MUSCLE LENGTH: Hamstrings: sitting wfl   POSTURE:  left higher than right Pt wearing right shoe orthotic due to pronation  PALPATION: Slight TTP bilat hip about bursa area  LUMBAR ROM:  WFL  LOWER EXTREMITY STRENGTH     MMT Right eval Left eval Rt/Lt  1/2 R / L 1/21 Rt/Lt 3/10  Hip flexion 48.1 45.0     Hip extension       Hip abduction 24.8 25.5 37.2/40.1 34.2 / 36.2 36.8/35.1  Hip adduction       Hip internal rotation       Hip external rotation       Knee flexion       Knee extension 45.7 46.6     Ankle dorsiflexion       Ankle plantarflexion       Ankle inversion       Ankle eversion        (Blank rows = not tested)     FUNCTIONAL TESTS:  5 times sit to stand: 13.72 Timed up and go (TUG): 9.77   4 stage balance: passed    1/21: 5 XSTS 9.40    GAIT: Distance walked: 500 ft Assistive device utilized: None Level of assistance: Complete Independence Comments: wfl  TODAY'S  TREATMENT:     Treatment                            04/10/23: Blank lines following charge title = not provided on this treatment date.   Manual:  TPDN No  There-ex: Bridge with ball bw knees- tactile cues for rib cage flare reduction Hesch self correction for Rt ant innom- will do daily 2 min for 1 week Hooklying iso press into  swiss ball + crunch Standing shoulder flexion holding  Squat- pause-tap table-pause- glut set to stand, ball bw knees Deep squat with ball bw knees, using bar Standing peroneal stretch There-Act: MET- Lt flexors/Rt extensors, iso add, iso abd  x2 rounds Self Care:  Nuro-Re-ed:  Gait Training:   Treatment                            3/10: Blank lines following charge title = not provided on this treatment date.   Manual:  TPDN YES Trigger Point Dry Needling  Subsequent Treatment: Instructions provided previously at initial dry needling treatment.   Patient Verbal Consent Given: Yes Education Handout Provided: Previously Provided Muscles Treated: Lt glut med, piriformis, TFL Electrical Stimulation Performed: No Treatment Response/Outcome: twitch with decreased spasm  There-ex: MET for pelvic rotation (Lt flexors/Rt extensors) followed by isometric adduction with ab set Supine firgure 4 with towel roll for gapping Supine butterfly with core engagement Bridge with ball bw knees, on heels There-Act:  Self Care:  Nuro-Re-ed:  Gait Training: Gait analysis following DN & MET   Treatment                            3/3: Blank lines following charge title = not provided on this treatment date.   Manual:  TPDN No  There-ex: Quick review of HEP Tall kneeling shoulder flexion, Y Tall kneeling forward hip hinge Stanidng hip hinge Mod single leg RDL 10lb There-Act:  Self Care:  Nuro-Re-ed:  Gait Training:     PATIENT EDUCATION:  Education details: Intro to aquatic therapy  Person educated: Patient Education method:  Explanation Education comprehension: verbalized understanding  HOME EXERCISE PROGRAM: Rib flare reduction in pilates [id number]  Aquatic  This aquatic home exercise program from [employer] utilizes pictures from land based exercises, but has been adapted prior to lamination and issuance.  Access Code: [email or web address] URL: [email or web address] Date: 02/05/2023 Prepared by: Geni Bers   ASSESSMENT:  CLINICAL IMPRESSION: S/s consistent with pelvic rotation and was able to decrease concordant pain with correction. Notable that she is able to deep squat with ball bw knees and use of bar to ensure straight line movement vs compensatory pattern. Will extend her POC and f/u every 2 weeks for progressed strengthening and functional movement with curve and rotation in mind.    PN: Has made steady but slow improvement.  She has improved in muscle strength meeting goal. She has also improved in her functional testing/ 5 x STS test reaching MDC/demonstrating statistically significant improvement. She continues to have high levels of pain which is reduced with submersion during aquatic sessions but has not yet had a residual effect lasting or overall reduction.  She will  benefit from continued PT but will focus land based.  Plan to see her in pool x 2 more visits to create and finalize aquatic HEP.      OBJECTIVE IMPAIRMENTS: decreased knowledge of condition, decreased strength, and pain.    PARTICIPATION LIMITATIONS: driving    REHAB POTENTIAL: Good  CLINICAL DECISION MAKING: Evolving/moderate complexity  EVALUATION COMPLEXITY: Moderate   GOALS: Goals reviewed with patient? Yes  SHORT TERM GOALS: Target date: 12/27/22  Pt will tolerate full aquatic sessions consistently without increase in pain and with improving function to demonstrate good toleration and effectiveness of intervention.  Baseline: Goal status: Met 12/14/22  2.  Pt will be indep with aquatic HEP and have  decided upon value of intervention to gain pool access. Baseline:  Goal status: In progress - 12/22/22  3.  Pt will report decrease of max pain to </= to 5/10 for improved daily function Baseline: 8/10 max,  daily=5/10 Goal status: In Progress -12/22/22/ In progress 01/30/23    LONG TERM GOALS: Target date: 04/06/23- not discussed on 3/3 due to being on prednisone  Pt to meet stated Foto Goal 70 Baseline: 53 Goal status: Deferred- FOTO ended  2.  Pt will be indep with final HEP's (land and aquatic as appropriate) for continued management of condition Baseline:  Goal status: ongoing 01/30/23; Aquatic HEP issued 02/12/23  3.  Pt will improve hip abd strength  by 10 lbs to demonstrate improved overall physical function Baseline: See chart  Goal status: achieved 01/30/23  4.  Pt will report a decrease in normal pain to </=3/10 for improved functional ability/toleration Baseline: 5/10, never below a 5 Goal status: ongoing 4/1  5.  Pt will report return to daily (weekly) exercise regimen for improved overall health and well being  Baseline:  Goal status: in progress 4/1    PLAN:  PT FREQUENCY: 1xweek  PT DURATION:8 visits  PLANNED INTERVENTIONS: 97164- PT Re-evaluation, 97110-Therapeutic exercises, 97530- Therapeutic activity, 97112- Neuromuscular re-education, 97535- Self Care, 40981- Manual therapy, [phone]- Gait training, [phone]- Orthotic Fit/training, [phone]- Aquatic Therapy, [phone]- Electrical stimulation (unattended), Patient/Family education, Balance training, Stair training, Taping, Dry Needling, Joint mobilization, Spinal manipulation, DME instructions, Cryotherapy, and Moist heat.  PLAN FOR NEXT SESSION: continue gross strength.    Arleene Settle C. Delaynie Stetzer PT, DPT 04/10/23 10:19 AM  [facility] [address] [place], [place], [address] Phone: [phone]   Fax:  [phone]

## 2023-04-17 ENCOUNTER — Ambulatory Visit: Admitting: Sports Medicine

## 2023-04-17 VITALS — BP 110/82 | HR 84 | Ht 64.0 in | Wt 147.0 lb

## 2023-04-17 DIAGNOSIS — M79671 Pain in right foot: Secondary | ICD-10-CM

## 2023-04-17 DIAGNOSIS — M7661 Achilles tendinitis, right leg: Secondary | ICD-10-CM | POA: Diagnosis not present

## 2023-04-17 NOTE — Progress Notes (Signed)
 Patricia Lloyd D.[facility] [address] [place] [address] Phone: [phone]   Assessment and Plan:     1. Pain of right heel 2. Right Achilles tendinitis  -Acute, initial visit left foot -most consistent with right Achilles strain versus right Achilles tendinitis.  Suspect this is due to overuse from driving versus compensation with ongoing treatment for left hip pain and right knee pain - May continue meloxicam 15 mg daily - Start topical Voltaren gel over areas of pain - Start HEP for Achilles tendinitis focusing on gastrocnemius and Achilles - Discussed prednisone Dosepak, however patient has had negative reaction and hypersensitivity to prednisone in the past, so ultimately decided to not proceed with prednisone  15 additional minutes spent for educating Therapeutic Home Exercise Program.  This included exercises focusing on stretching, strengthening, with focus on eccentric aspects.   Long term goals include an improvement in range of motion, strength, endurance as well as avoiding reinjury. Patient's frequency would include in 1-2 times a day, 3-5 times a week for a duration of 6-12 weeks. Proper technique shown and discussed handout in great detail with ATC.  All questions were discussed and answered.    Pertinent previous records reviewed include none  Follow Up: As needed if no improvement or worsening of symptoms in 2 to 3 weeks.  Could consider ultrasound versus physical therapy   Subjective:   I, Patricia Lloyd, am serving as a Neurosurgeon for Doctor Richardean Sale  Chief Complaint: right heel pain   HPI:   04/17/23 Patient is a 63 year old female with right heel pain. Patient states pain started yesterday. Pain on the sides of her heel. She used cream and rolling ball. Pain radiates to her achilles area. Meloxicam has not helped at all. Decreased ROM due to pain. States some swelling.   Relevant Historical Information:  Fibromyalgia  Additional pertinent review of systems negative.   Current Outpatient Medications:    B Complex Vitamins (VITAMIN B-COMPLEX) TABS, Take by mouth., Disp: , Rfl:    Biotin 1000 MCG tablet, Take by mouth., Disp: , Rfl:    cetirizine-pseudoephedrine (ZYRTEC-D) 5-120 MG tablet, One twice daily for congestion (Patient taking differently: 1 tablet daily. One twice daily for congestion), Disp: 20 tablet, Rfl: 1   ESTERIFIED ESTROGENS PO, Take by mouth., Disp: , Rfl:    estradiol (VIVELLE-DOT) 0.05 MG/24HR patch, Place 1 patch onto the skin 2 (two) times a week., Disp: , Rfl:    fluconazole (DIFLUCAN) 150 MG tablet, 1 po stat, repeat x 1 in 48 hours, Disp: , Rfl:    magnesium gluconate (MAGONATE) 500 MG tablet, Take 500 mg by mouth 2 (two) times daily., Disp: , Rfl:    meloxicam (MOBIC) 15 MG tablet, Take 1 tablet (15 mg total) by mouth daily as needed for pain., Disp: 90 tablet, Rfl: 1   Multiple Vitamin (MULTI VITAMIN DAILY PO), , Disp: , Rfl:    Multiple Vitamins-Minerals (MULTI COMPLETE PO), Take by mouth., Disp: , Rfl:    Progesterone Micronized (PROGESTERONE PO), Take by mouth., Disp: , Rfl:    valACYclovir (VALTREX) 500 MG tablet, Take 1 tab twice daily as needed for 5 days at first signs of symptoms., Disp: 30 tablet, Rfl: 1  Current Facility-Administered Medications:    methylPREDNISolone acetate (DEPO-MEDROL) injection 80 mg, 80 mg, Intramuscular, Once, Meccariello, Solmon Ice, MD   Objective:     Vitals:   04/17/23 1611  BP: 110/82  Pulse: 84  SpO2: 99%  Weight: 147 lb (66.7 kg)  Height: 5\' 4"  (1.626 m)      Body mass index is 25.23 kg/m.    Physical Exam:    Gen: Appears well, nad, nontoxic and pleasant Psych: Alert and oriented, appropriate mood and affect Neuro: sensation intact, strength is 5/5 with df/pf/inv/ev, muscle tone wnl Skin: no susupicious lesions or rashes  Right foot/ankle:  No deformity, no swelling or effusion TTP posterior calcaneus,  Achilles tendon, distal gastrocnemius musculature NTTP over fibular head, lat mal, medial mal, achilles, navicular, base of 5th, ATFL, CFL, deltoid,   or midfoot ROM DF 30, PF 45, inv/ev intact Negative ant drawer, talar tilt, rotation test, squeeze test. Neg thompson No pain with resisted inversion or eversion    Electronically signed by:  Patricia Lloyd [facility] 4:34 PM 04/17/23

## 2023-04-17 NOTE — Patient Instructions (Signed)
 Achilles HEP  Continue meloxicam and start Voltaren gel over areas of pain As needed follow up, if no improvement we will see you in 3-4 weeks

## 2023-04-24 ENCOUNTER — Ambulatory Visit (HOSPITAL_BASED_OUTPATIENT_CLINIC_OR_DEPARTMENT_OTHER): Admitting: Physical Therapy

## 2023-04-26 NOTE — Progress Notes (Signed)
 Patricia Lloyd [address] [place] [address] Phone: [phone] Subjective:   Patricia Lloyd, am serving as a scribe for Patricia Lloyd.  I'm seeing this patient by the request  of:  Patricia Greulich, MD  CC: back and neck pain follow up, heel and knee pain as well   VOZ:[id number]  Patricia Lloyd is a 63 y.o. female coming in with complaint of back and neck pain. OMT 03/09/2023. Also f/u for B knee pain and R heel pain. Saw Patricia Lloyd since last visit with us  for heel pain. Patient states that she got better but now feels worse again. Heel pain has subsided.   C/o pain in L shoulder. Hx of RTC repair in 2010. Pain in back of shoulder and feels like she has limited ROM. Patient tried shockwave therapy but unsure if this helped.   Medications patient has been prescribed: None          Reviewed prior external information including notes and imaging from previsou exam, outside providers and external EMR if available.   As well as notes that were available from care everywhere and other healthcare systems.  Past medical history, social, surgical and family history all reviewed in electronic medical record.  No pertanent information unless stated regarding to the chief complaint.   Past Medical History:  Diagnosis Date   Fibromyalgia    H/O fatigue    Insomnia    PONV (postoperative nausea and vomiting)    severe    No Known Allergies   Review of Systems:  No headache, visual changes, nausea, vomiting, diarrhea, constipation, dizziness, abdominal pain, skin rash, fevers, chills, night sweats, weight loss, swollen lymph nodes, body aches, joint swelling, chest pain, shortness of breath, mood changes. POSITIVE muscle aches  Objective  Blood pressure 112/86, pulse 75, height 5\' 4"  (1.626 m), weight 144 lb (65.3 kg), last menstrual period 12/12/2012.   General: No apparent distress alert and oriented x3 mood and affect normal,  dressed appropriately.  HEENT: Pupils equal, extraocular movements intact  Respiratory: Patient's speak in full sentences and does not appear short of breath  Cardiovascular: No lower extremity edema, non tender, no erythema  Left shoulder exam shows the patient does have positive impingement noted.  Crepitus noted.  Limited range of motion in internal and external range of motion.  Limited muscular skeletal ultrasound was performed and interpreted by Ronnell Lloyd, M  Limited ultrasound of patient's left shoulder shows hypoechoic changes noted fairly significantly in the posterior glenohumeral area.  Some labral tearing likely noted.  Narrowing noted that is consistent with arthritic changes.  Postsurgical changes noted of the bicep and supraspinatus tendon. Impression: Arthritis of the shoulder. Procedure: Real-time Ultrasound Guided Injection of left glenohumeral joint Device: GE Logiq E  Ultrasound guided injection is preferred based studies that show increased duration, increased effect, greater accuracy, decreased procedural pain, increased response rate with ultrasound guided versus blind injection.  Verbal informed consent obtained.  Time-out conducted.  Noted no overlying erythema, induration, or other signs of local infection.  Skin prepped in a sterile fashion.  Local anesthesia: Topical Ethyl chloride.  With sterile technique and under real time ultrasound guidance:  Joint visualized.  21g 2 inch needle inserted posterior approach. Pictures taken for needle placement. Patient did have injection of 2 cc of 0.5% Marcaine , and 1cc of Kenalog  40 mg/dL. Completed without difficulty  Pain immediately resolved suggesting accurate placement of the medication.  Advised to call  if fevers/chills, erythema, induration, drainage, or persistent bleeding.  Images permanently stored  Impression: Technically successful ultrasound guided injection.        Assessment and Plan:  Tendonitis of  left rotator cuff Chronic problem, I do believe the patient has more of a rotator cuff arthropathy at this point.  Discussed with patient at great length.  We discussed that there was some effusion noted of the shoulder.  Given an injection and hopeful that this will make significant improvement for some time.  Encourage patient to continue to stay active including no swimming that she knows has been more beneficial.  Will continue to monitor the knee pain.  Follow-up again in 6 to 8 weeks      The above documentation has been reviewed and is accurate and complete Patricia Lloyd M Patricia Valenza, DO          Note: This dictation was prepared with Dragon dictation along with smaller phrase technology. Any transcriptional errors that result from this process are unintentional.

## 2023-04-30 ENCOUNTER — Other Ambulatory Visit: Payer: Self-pay | Admitting: Sports Medicine

## 2023-05-01 ENCOUNTER — Other Ambulatory Visit: Payer: Self-pay

## 2023-05-01 ENCOUNTER — Encounter: Payer: Self-pay | Admitting: Family Medicine

## 2023-05-01 ENCOUNTER — Ambulatory Visit (INDEPENDENT_AMBULATORY_CARE_PROVIDER_SITE_OTHER): Payer: BC Managed Care – PPO | Admitting: Family Medicine

## 2023-05-01 VITALS — BP 112/86 | HR 75 | Ht 64.0 in | Wt 144.0 lb

## 2023-05-01 DIAGNOSIS — M7582 Other shoulder lesions, left shoulder: Secondary | ICD-10-CM | POA: Diagnosis not present

## 2023-05-01 DIAGNOSIS — M79671 Pain in right foot: Secondary | ICD-10-CM | POA: Diagnosis not present

## 2023-05-01 MED ORDER — MELOXICAM 15 MG PO TABS: 15.0000 mg | ORAL_TABLET | Freq: Every day | ORAL | 0 refills | Status: AC

## 2023-05-01 NOTE — Assessment & Plan Note (Addendum)
 Chronic problem, I do believe the patient has more of a rotator cuff arthropathy at this point.  Discussed with patient at great length.  We discussed that there was some effusion noted of the shoulder.  Given an injection and hopeful that this will make significant improvement for some time.  Encourage patient to continue to stay active including no swimming that she knows has been more beneficial.  Will continue to monitor the knee pain.  Follow-up again in 6 to 8 weeks meloxicam  15 mg daily we prescribed

## 2023-05-01 NOTE — Patient Instructions (Signed)
 Injected shoulder See rheumatology See me in 2 months

## 2023-05-29 ENCOUNTER — Other Ambulatory Visit: Payer: Self-pay | Admitting: Family

## 2023-05-29 DIAGNOSIS — Z9189 Other specified personal risk factors, not elsewhere classified: Secondary | ICD-10-CM

## 2023-06-20 ENCOUNTER — Other Ambulatory Visit: Payer: Self-pay | Admitting: Rheumatology

## 2023-06-20 DIAGNOSIS — Z1589 Genetic susceptibility to other disease: Secondary | ICD-10-CM

## 2023-06-20 DIAGNOSIS — M549 Dorsalgia, unspecified: Secondary | ICD-10-CM

## 2023-06-27 NOTE — Progress Notes (Signed)
 [facility] [address] [place] [address] Phone: [phone] Subjective:   Delwyn Filippo, am serving as a scribe for Dr. Ronnell Coins.  I'm seeing this patient by the request  of:  Viola Greulich, MD  CC: right heel and left shoulder pain   [id number]  05/01/2023 Chronic problem, I do believe the patient has more of a rotator cuff arthropathy at this point.  Discussed with patient at great length.  We discussed that there was some effusion noted of the shoulder.  Given an injection and hopeful that this will make significant improvement for some time.  Encourage patient to continue to stay active including no swimming that she knows has been more beneficial.  Will continue to monitor the knee pain.  Follow-up again in 6 to 8 weeks meloxicam  15 mg daily we prescribed     Update 06/28/2023 Keidy Thurgood Mcphillips is a 63 y.o. female coming in with complaint of R heel and L shoulder pain.  Left shoulder given injection at last follow-up 8 weeks ago.  Patient states that her shoulder is doing well today.   R knee and L hip continue to bother her. Just moved and feels that her pain increased. Knee is slightly swollen. Painful to perform deep knee bending. Tightness over lateral aspect.   Today her hip pain is in the superior glute.       Past Medical History:  Diagnosis Date   Fibromyalgia    H/O fatigue    Insomnia    PONV (postoperative nausea and vomiting)    severe   Past Surgical History:  Procedure Laterality Date   CESAREAN SECTION  1995   CESAREAN SECTION  2000   DILATATION & CURETTAGE/HYSTEROSCOPY WITH TRUECLEAR N/A 01/01/2013   Procedure: DILATATION & CURETTAGE/HYSTEROSCOPY WITH TRUCLEAR;  Surgeon: Piedad Brewer, MD;  Location: [facility];  Service: Gynecology;  Laterality: N/A;   SHOULDER SURGERY  05/03/2010   Left shoulder surgery, Dr.Doldorf     TMJ ARTHROPLASTY  1990   Social History   Socioeconomic History   Marital  status: Single    Spouse name: Not on file   Number of children: 2   Years of education: COLLEGE   Highest education level: Not on file  Occupational History   Occupation: Dress Code    Employer: [employer]  Tobacco Use   Smoking status: Former   Smokeless tobacco: Never   Tobacco comments:    Social smoker quit at age 33  Substance and Sexual Activity   Alcohol use: Yes    Alcohol/week: 0.0 standard drinks of alcohol    Comment: 2 GLASSES WINE DAILY    Drug use: No   Sexual activity: Not on file  Other Topics Concern   Not on file  Social History Narrative   Lives at home w/ her fiance    Right-handed   Caffeine: 1 cup of coffee per day   Social Drivers of Corporate investment banker Strain: Not on file  Food Insecurity: Not on file  Transportation Needs: Not on file  Physical Activity: Not on file  Stress: Not on file  Social Connections: Not on file   No Known Allergies Family History  Problem Relation Age of Onset   Cancer Mother        Pancreatic    Hypertension Father    Cancer Maternal Aunt     Current Outpatient Medications (Endocrine & Metabolic):    ESTERIFIED ESTROGENS  PO, Take by mouth.   estradiol  (VIVELLE -DOT) 0.05 MG/24HR patch, Place 1 patch onto the skin 2 (two) times a week.   Progesterone  Micronized (PROGESTERONE  PO), Take by mouth.  Current Facility-Administered Medications (Endocrine & Metabolic):    methylPREDNISolone  acetate (DEPO-MEDROL ) injection 80 mg    Current Outpatient Medications (Respiratory):    cetirizine -pseudoephedrine  (ZYRTEC -D) 5-120 MG tablet, One twice daily for congestion (Patient taking differently: 1 tablet daily. One twice daily for congestion)   Current Outpatient Medications (Analgesics):    meloxicam  (MOBIC ) 15 MG tablet, Take 1 tablet (15 mg total) by mouth daily as needed for pain.   meloxicam  (MOBIC ) 15 MG tablet, Take 1 tablet (15 mg total) by mouth daily.     Current Outpatient Medications  (Other):    B Complex Vitamins (VITAMIN B-COMPLEX) TABS, Take by mouth.   Biotin 1000 MCG tablet, Take by mouth.   fluconazole (DIFLUCAN) 150 MG tablet, 1 po stat, repeat x 1 in 48 hours   magnesium gluconate (MAGONATE) 500 MG tablet, Take 500 mg by mouth 2 (two) times daily.   Multiple Vitamin (MULTI VITAMIN DAILY PO),    Multiple Vitamins-Minerals (MULTI COMPLETE PO), Take by mouth.   valACYclovir  (VALTREX ) 500 MG tablet, Take 1 tab twice daily as needed for 5 days at first signs of symptoms.    Reviewed prior external information including notes and imaging from  primary care provider As well as notes that were available from care everywhere and other healthcare systems.  Past medical history, social, surgical and family history all reviewed in electronic medical record.  No pertanent information unless stated regarding to the chief complaint.   Review of Systems:  No headache, visual changes, nausea, vomiting, diarrhea, constipation, dizziness, abdominal pain, skin rash, fevers, chills, night sweats, weight loss, swollen lymph nodes, body aches, joint swelling, chest pain, shortness of breath, mood changes. POSITIVE muscle aches  Objective  Blood pressure 112/80, pulse 68, height 5' 4 (1.626 m), weight 144 lb (65.3 kg), last menstrual period 12/12/2012, SpO2 98%.   General: No apparent distress alert and oriented x3 mood and affect normal, dressed appropriately.  HEENT: Pupils equal, extraocular movements intact  Respiratory: Patient's speak in full sentences and does not appear short of breath  Cardiovascular: No lower extremity edema, non tender, no erythema  Arthritic changes noted of the back.  Patient's right knee does have some crepitus noted with lateral tracking of the patella noted.  Tenderness to palpation more in the lumbar area and more on the right sacroiliac joint noted today.  Osteopathic findings T3 extended rotated and side bent right inhaled third rib T9  extended rotated and side bent left L2 flexed rotated and side bent right L5 flexed rotated and side bent right Sacrum right on right  Limited muscular skeletal ultrasound was performed and interpreted by Ronnell Coins, M  Limited ultrasound shows some very mild hypoechoic changes noted of the patellofemoral joint.  Narrowing of the lateral and medial aspect of the patellofemoral joint.  Mild reaccumulation of a right sided Baker's cyst noted.    Impression and Recommendations:  Lumbar radiculopathy More low back pain, discussed again about different treatment options but patient has decided that she responds relatively well to osteopathic manipulation.  Tried that again as a treatment option.  Would like to see patient back in 6 to 8 weeks after conservative therapy including home exercises to see how patient is responding in 6 to 8 weeks.  Patellofemoral arthritis of right knee Tru pull lite  brace given, discussed worsening symptoms for injections.  Discussed icing regimen otherwise.      Decision today to treat with OMT was based on Physical Exam  After verbal consent patient was treated with HVLA, ME, FPR techniques in  thoracic, rib, lumbar and sacral areas, all areas are chronic   Patient tolerated the procedure well with improvement in symptoms  Patient given exercises, stretches and lifestyle modifications  See medications in patient instructions if given  Patient will follow up in 4-8 weeks  The above documentation has been reviewed and is accurate and complete Rozalia Dino M Travante Knee, DO

## 2023-06-28 ENCOUNTER — Other Ambulatory Visit: Payer: Self-pay

## 2023-06-28 ENCOUNTER — Ambulatory Visit: Admitting: Family Medicine

## 2023-06-28 VITALS — BP 112/80 | HR 68 | Ht 64.0 in | Wt 144.0 lb

## 2023-06-28 DIAGNOSIS — M9903 Segmental and somatic dysfunction of lumbar region: Secondary | ICD-10-CM

## 2023-06-28 DIAGNOSIS — M25512 Pain in left shoulder: Secondary | ICD-10-CM | POA: Diagnosis not present

## 2023-06-28 DIAGNOSIS — M1711 Unilateral primary osteoarthritis, right knee: Secondary | ICD-10-CM | POA: Diagnosis not present

## 2023-06-28 DIAGNOSIS — M9902 Segmental and somatic dysfunction of thoracic region: Secondary | ICD-10-CM | POA: Diagnosis not present

## 2023-06-28 DIAGNOSIS — M9904 Segmental and somatic dysfunction of sacral region: Secondary | ICD-10-CM

## 2023-06-28 DIAGNOSIS — M5416 Radiculopathy, lumbar region: Secondary | ICD-10-CM

## 2023-06-28 DIAGNOSIS — G8929 Other chronic pain: Secondary | ICD-10-CM

## 2023-06-28 NOTE — Patient Instructions (Signed)
 R tru pull lite  PF exercises See me in 2-3 months

## 2023-06-28 NOTE — Assessment & Plan Note (Signed)
 Tru pull lite brace given, discussed worsening symptoms for injections.  Discussed icing regimen otherwise.

## 2023-06-28 NOTE — Assessment & Plan Note (Signed)
 More low back pain, discussed again about different treatment options but patient has decided that she responds relatively well to osteopathic manipulation.  Tried that again as a treatment option.  Would like to see patient back in 6 to 8 weeks after conservative therapy including home exercises to see how patient is responding in 6 to 8 weeks.

## 2023-07-19 ENCOUNTER — Other Ambulatory Visit: Payer: Self-pay | Admitting: Family Medicine

## 2023-07-19 DIAGNOSIS — B009 Herpesviral infection, unspecified: Secondary | ICD-10-CM

## 2023-09-06 NOTE — Progress Notes (Unsigned)
 [facility] [address] [place] [address] Phone: [phone] Subjective:   Patricia [facility], am serving as a scribe for Dr. Arthea Claudene.  I'm seeing this patient by the request  of:  Patricia Clotilda SAUNDERS, MD  CC: back and neck pain   YEP:Dlagzrupcz  Patricia Lloyd is a 63 y.o. female coming in with complaint of back and neck pain. OMT 06/28/2023. Patient states talk to you about R knee foot and L shoulder. Knee brace? Questions about R foot numbness.  Shoulder is still bothering her, constantly. Last injection didn't help much.  Medications patient has been prescribed: None  Taking:         Reviewed prior external information including notes and imaging from previsou exam, outside providers and external EMR if available.   As well as notes that were available from care everywhere and other healthcare systems.  Past medical history, social, surgical and family history all reviewed in electronic medical record.  No pertanent information unless stated regarding to the chief complaint.   Past Medical History:  Diagnosis Date   Fibromyalgia    H/O fatigue    Insomnia    PONV (postoperative nausea and vomiting)    severe    No Known Allergies   Review of Systems:  No headache, visual changes, nausea, vomiting, diarrhea, constipation, dizziness, abdominal pain, skin rash, fevers, chills, night sweats, weight loss, swollen lymph nodes, body aches, joint swelling, chest pain, shortness of breath, mood changes. POSITIVE muscle aches  Objective  Blood pressure 124/82, pulse 71, height 5' 4 (1.626 m), last menstrual period 12/12/2012, SpO2 97%.   General: No apparent distress alert and oriented x3 mood and affect normal, dressed appropriately.  HEENT: Pupils equal, extraocular movements intact  Respiratory: Patient's speak in full sentences and does not appear short of breath  Cardiovascular: No lower extremity edema, non tender, no  erythema  Arthritic changes of multiple joints.  Patient's right foot does have breakdown of the transverse arch noted.  Positive squeeze test noted.  Fairly severe on the right side. Left shoulder still has positive impingement noted.  Rotator cuff strength though does appear to be intact.  Does have some limited range of motion in all planes and is consistent with some of the underlying arthritis that patient does have. Low back is stable sitting very comfortably today. Right knee does have lateral tracking of the patella noted.  Some mild crepitus skis even audible with patient going through active range of motion.     Assessment and Plan:  Patellofemoral arthritis of right knee Continues to be an area of concern.  Discussed potential injections if needed.  Feels like she is doing relatively well though.  No other significant changes.  Will continue to monitor.  Follow-up again in 2 to 3 months did discuss the possibility of a Tru pull lite brace which I think will be beneficial to help her with the stability of the kneecap.  Morton neuroma, right Patient does have a positive squeeze test today.  We discussed that the right for the orthotics.  Worsening pain will need to consider injections.  Last talk about it about this time a year ago.  Patient is in agreement with the plan.  Lumbar radiculopathy Stable at the moment.  No other changes.  Held on any manipulation today.  Tendonitis of left rotator cuff Discussed with patient at great length and discussed the next up would be potential MRI.  Patient does  not want any surgical intervention.  Would like to hold on anything at this moment.  Has had surgery on it previously.  Last injection note was quite sometime ago but we will continue to monitor.  Follow-up again in 2 to 3 months       The above documentation has been reviewed and is accurate and complete Quinn Quam M Devaunte Gasparini, DO          Note: This dictation was prepared with Dragon  dictation along with smaller phrase technology. Any transcriptional errors that result from this process are unintentional.

## 2023-09-11 ENCOUNTER — Encounter: Payer: Self-pay | Admitting: Family Medicine

## 2023-09-11 ENCOUNTER — Ambulatory Visit (INDEPENDENT_AMBULATORY_CARE_PROVIDER_SITE_OTHER): Admitting: Family Medicine

## 2023-09-11 VITALS — BP 124/82 | HR 71 | Ht 64.0 in

## 2023-09-11 DIAGNOSIS — M25512 Pain in left shoulder: Secondary | ICD-10-CM | POA: Diagnosis not present

## 2023-09-11 DIAGNOSIS — M7582 Other shoulder lesions, left shoulder: Secondary | ICD-10-CM | POA: Diagnosis not present

## 2023-09-11 DIAGNOSIS — G5761 Lesion of plantar nerve, right lower limb: Secondary | ICD-10-CM | POA: Diagnosis not present

## 2023-09-11 DIAGNOSIS — M1711 Unilateral primary osteoarthritis, right knee: Secondary | ICD-10-CM

## 2023-09-11 DIAGNOSIS — M5416 Radiculopathy, lumbar region: Secondary | ICD-10-CM

## 2023-09-11 DIAGNOSIS — G8929 Other chronic pain: Secondary | ICD-10-CM

## 2023-09-11 NOTE — Assessment & Plan Note (Signed)
 Stable at the moment.  No other changes.  Held on any manipulation today.

## 2023-09-11 NOTE — Assessment & Plan Note (Addendum)
 Continues to be an area of concern.  Discussed potential injections if needed.  Feels like she is doing relatively well though.  No other significant changes.  Will continue to monitor.  Follow-up again in 2 to 3 months did discuss the possibility of a Tru pull lite brace which I think will be beneficial to help her with the stability of the kneecap.

## 2023-09-11 NOTE — Patient Instructions (Addendum)
 Xray today Keep wearing decent shoes You have 14 days to return or exchange your brace Call [phone], then return the brace to our office See you again in 2-3 months

## 2023-09-11 NOTE — Assessment & Plan Note (Signed)
 Discussed with patient at great length and discussed the next up would be potential MRI.  Patient does not want any surgical intervention.  Would like to hold on anything at this moment.  Has had surgery on it previously.  Last injection note was quite sometime ago but we will continue to monitor.  Follow-up again in 2 to 3 months

## 2023-09-11 NOTE — Assessment & Plan Note (Signed)
 Patient does have a positive squeeze test today.  We discussed that the right for the orthotics.  Worsening pain will need to consider injections.  Last talk about it about this time a year ago.  Patient is in agreement with the plan.

## 2023-09-27 ENCOUNTER — Ambulatory Visit (INDEPENDENT_AMBULATORY_CARE_PROVIDER_SITE_OTHER): Admitting: Sports Medicine

## 2023-09-27 VITALS — HR 75 | Ht 64.0 in | Wt 144.0 lb

## 2023-09-27 DIAGNOSIS — M25561 Pain in right knee: Secondary | ICD-10-CM

## 2023-09-27 DIAGNOSIS — M79661 Pain in right lower leg: Secondary | ICD-10-CM

## 2023-09-27 DIAGNOSIS — M7661 Achilles tendinitis, right leg: Secondary | ICD-10-CM | POA: Diagnosis not present

## 2023-09-27 DIAGNOSIS — G8929 Other chronic pain: Secondary | ICD-10-CM

## 2023-09-27 MED ORDER — CELECOXIB 200 MG PO CAPS: 200.0000 mg | ORAL_CAPSULE | Freq: Two times a day (BID) | ORAL | 0 refills | Status: AC

## 2023-09-27 NOTE — Patient Instructions (Addendum)
 Use Celebrex  200 mg  2x daily as needed for pain.  Recommend limiting chronic NSAIDs to 1-2 doses per week to prevent long-term side effects.  Knee HEP   For long term pain management Tylenol [phone] mg 2-3 times a day for pain relief   - Use meloxicam ,or Celebrex  as needed for pain.  Recommend limiting chronic NSAIDs to 1-2 doses per week to prevent long-term side effects.  MRI tib fib, and knee   Follow up 5 days after MRI

## 2023-09-27 NOTE — Progress Notes (Signed)
 [facility] Zylka [facility] [address] [place] [address] Phone: [phone]   Assessment and Plan:     1. Chronic pain of right knee (Primary) 2. Right Achilles tendinitis 3. Right calf pain -Chronic with exacerbation, subsequent visit - Patient complains of continued and worsening right posterior knee pain, calf pain, distal leg pain that is been ongoing for months.  Currently unclear etiology of ongoing chronic pain.  Based on patient's areas of pain including posterior knee, calf, most likely etiology would be intra-articular/posterior pathology such as meniscal tear versus proximal gastroc strain.  Patient has had patellofemoral syndrome type pain and Achilles tendon type pain in the past, though these areas are not actively inflamed on physical exam. - Recommend further evaluation with MRI of knee and tib-fib to fully evaluate calf due to failure to improve despite >6 weeks of conservative therapy, pain with day-to-day activities, pain >6/10 - Start Celebrex  200 mg twice daily x2 weeks.  If still having pain after 2 weeks, complete 3rd-week of NSAID. May use remaining NSAID as needed once daily for pain control.  Do not to use additional over-the-counter NSAIDs (ibuprofen , naproxen, Advil , Aleve, etc.) while taking prescription NSAIDs.  May use Tylenol [phone] mg 2 to 3 times a day for breakthrough pain.  -Start HEP for knee and calf - Patient has been using meloxicam  daily for years.  Discussed long-term side effects of daily NSAID use.  After completing new course of Celebrex , would recommend only using NSAIDs 1-2 times per week to limit long-term side effects.  Pertinent previous records reviewed include right knee x-ray 11/23/2021, lumbar x-ray 05/05/2021   Follow Up: 5 days after MRIs to review results and discuss treatment plan   Subjective:   I, [name], am serving as a Neurosurgeon for Doctor Morene Mace  Chief Complaint:  right knee pain   HPI:   09/27/23 Patient is a 63 year old female with right  knee pain. Patient states pain started 09/24/2023. No MOI. Has pain at rest. Does have braces. Tylenol helped a tiny bit. Lateral knee pain that radiates to her calf. She notes swelling down to her ankle. She has tightness in the calf. She did a lot of walking in [place]. She was wearing sneakers but no inserts.   Relevant Historical Information: Fibromyalgia  Additional pertinent review of systems negative.   Current Outpatient Medications:    celecoxib  (CELEBREX ) 200 MG capsule, Take 1 capsule (200 mg total) by mouth 2 (two) times daily., Disp: 60 capsule, Rfl: 0   B Complex Vitamins (VITAMIN B-COMPLEX) TABS, Take by mouth., Disp: , Rfl:    Biotin 1000 MCG tablet, Take by mouth., Disp: , Rfl:    cetirizine -pseudoephedrine  (ZYRTEC -D) 5-120 MG tablet, One twice daily for congestion (Patient taking differently: 1 tablet daily. One twice daily for congestion), Disp: 20 tablet, Rfl: 1   ESTERIFIED ESTROGENS PO, Take by mouth., Disp: , Rfl:    estradiol  (VIVELLE -DOT) 0.05 MG/24HR patch, Place 1 patch onto the skin 2 (two) times a week., Disp: , Rfl:    fluconazole (DIFLUCAN) 150 MG tablet, 1 po stat, repeat x 1 in 48 hours, Disp: , Rfl:    magnesium gluconate (MAGONATE) 500 MG tablet, Take 500 mg by mouth 2 (two) times daily., Disp: , Rfl:    meloxicam  (MOBIC ) 15 MG tablet, Take 1 tablet (15 mg total) by mouth daily as needed for pain., Disp: 90 tablet, Rfl: 1   meloxicam  (MOBIC ) 15 MG  tablet, Take 1 tablet (15 mg total) by mouth daily., Disp: 30 tablet, Rfl: 0   Multiple Vitamin (MULTI VITAMIN DAILY PO), , Disp: , Rfl:    Multiple Vitamins-Minerals (MULTI COMPLETE PO), Take by mouth., Disp: , Rfl:    Progesterone  Micronized (PROGESTERONE  PO), Take by mouth., Disp: , Rfl:    valACYclovir  (VALTREX ) 500 MG tablet, TAKE 1 TABLET BY MOUTH TWICE DAILY AS NEEDED FOR 5 DAYS AT FIRST SIGNS OF SYMPTOMS, Disp: 30 tablet, Rfl:  1  Current Facility-Administered Medications:    methylPREDNISolone  acetate (DEPO-MEDROL ) injection 80 mg, 80 mg, Intramuscular, Once, Meccariello, Con PARAS, MD   Objective:     Vitals:   09/27/23 1101  Pulse: 75  SpO2: 98%  Weight: 144 lb (65.3 kg)  Height: 5' 4 (1.626 m)      Body mass index is 24.72 kg/m.    Physical Exam:    General:  awake, alert oriented, no acute distress nontoxic Skin: no suspicious lesions or rashes Neuro:sensation intact and strength 5/5 with no deficits, no atrophy, normal muscle tone Psych: No signs of anxiety, depression or other mood disorder  Right knee: No swelling No deformity Neg fluid wave, joint milking ROM Flex 110, Ext 0 TTP posterior fossa, medial and lateral gastroc, medial tibial shaft, anterior shin musculature NTTP over the quad tendon, medial fem condyle, lat fem condyle, patella, plica, patella tendon, tibial tuberostiy, fibular head,  , pes anserine bursa, gerdy's tubercle, medial jt line, lateral jt line Neg anterior and posterior drawer Neg lachman Neg sag sign Negative varus stress Negative valgus stress Negative McMurray Negative Thessaly No pain with resisted knee flexion or extension or ankle dorsiflexion.  Mild discomfort through calf with resisted foot plantarflexion Gait normal    Electronically signed by:  Odis Mace [facility] 11:52 AM 09/27/23

## 2023-10-08 ENCOUNTER — Other Ambulatory Visit

## 2023-10-08 ENCOUNTER — Telehealth: Payer: Self-pay | Admitting: Family Medicine

## 2023-10-08 NOTE — Telephone Encounter (Signed)
 Called patient and she stated that she did call and the [place] location was the soonest she could get in but their machine has broke and the soonest appt she has is oct 9.

## 2023-10-08 NOTE — Telephone Encounter (Addendum)
 Patient had her MRI appt cancelled and she would like to see if she can go to the location on [address]. She is asking if it is because of her insurance she was sent to the [place] location. Please advise.

## 2023-10-18 ENCOUNTER — Ambulatory Visit
Admission: RE | Admit: 2023-10-18 | Discharge: 2023-10-18 | Disposition: A | Discharge status: Home / Self Care | Source: Ambulatory Visit | Attending: Sports Medicine | Admitting: Sports Medicine

## 2023-10-18 ENCOUNTER — Ambulatory Visit
Admission: RE | Admit: 2023-10-18 | Discharge: 2023-10-18 | Disposition: A | Discharge status: Home / Self Care | Source: Ambulatory Visit | Attending: Rheumatology | Admitting: Rheumatology

## 2023-10-18 ENCOUNTER — Ambulatory Visit: Payer: Self-pay | Admitting: Sports Medicine

## 2023-10-18 DIAGNOSIS — M79661 Pain in right lower leg: Secondary | ICD-10-CM

## 2023-10-18 DIAGNOSIS — Z1589 Genetic susceptibility to other disease: Secondary | ICD-10-CM

## 2023-10-18 DIAGNOSIS — M549 Dorsalgia, unspecified: Secondary | ICD-10-CM

## 2023-10-18 DIAGNOSIS — G8929 Other chronic pain: Secondary | ICD-10-CM

## 2023-10-18 DIAGNOSIS — M7661 Achilles tendinitis, right leg: Secondary | ICD-10-CM

## 2023-10-24 NOTE — Progress Notes (Unsigned)
 Ben Jackson D.[facility] [address] [place] [address] Phone: [phone]   Assessment and Plan:     1. Chronic pain of right knee (Primary) 2. Right calf pain 3. Tear of meniscus of right knee as current injury, unspecified meniscus, unspecified tear type, initial encounter -Chronic with exacerbation, subsequent visit - Reviewed MRI right knee and tib-fib from 10/18/2023 which showed mild degenerative changes, mild undersurface horizontal tear at junction of body and anterior horn of lateral meniscus, mild gastroc strain, ACL cyst measuring 1.0 x 2.8 x 0.7 cm - Patient continues to experience daily pain that worsens with physical activity primarily in intra-articular and posterior knee.  Suspect patient's pain is multifactorial including ACL cyst, small meniscal tear, mild osteoarthritis - Patient has had mild relief with HEP and NSAID course - Patient is not interested in intra-articular CSI since this does not provide a permanent fix. - Patient is interested in discussing possible arthroscopic options with orthopedic surgery.  Will refer to orthopedic surgery - Use Celebrex  200 mg daily as needed for breakthrough pain.  Recommend limiting chronic NSAIDs to 1-2 doses per week to prevent long-term side effects. Use Tylenol 500 to 1000 mg tablets 2-3 times a day as needed for day-to-day pain relief.    -Continue physical activity and HEP as tolerated  Pertinent previous records reviewed include right knee and tib-fib MRI 10/18/2023   Follow Up: As needed   Subjective:   I, Chestine Reeves, am serving as a Neurosurgeon for Doctor Morene Mace   Chief Complaint: right knee pain    HPI:    09/27/23 Patient is a 63 year old female with right  knee pain. Patient states pain started 09/24/2023. No MOI. Has pain at rest. Does have braces. Tylenol helped a tiny bit. Lateral knee pain that radiates to her calf. She notes swelling down to her ankle.  She has tightness in the calf. She did a lot of walking in [place]. She was wearing sneakers but no inserts.   10/25/2023 Patient states pain improved but, pain is intermittent. She feels tentative    Relevant Historical Information: Fibromyalgia    Additional pertinent review of systems negative.   Current Outpatient Medications:    B Complex Vitamins (VITAMIN B-COMPLEX) TABS, Take by mouth., Disp: , Rfl:    Biotin 1000 MCG tablet, Take by mouth., Disp: , Rfl:    celecoxib  (CELEBREX ) 200 MG capsule, Take 1 capsule (200 mg total) by mouth 2 (two) times daily., Disp: 60 capsule, Rfl: 0   cetirizine -pseudoephedrine  (ZYRTEC -D) 5-120 MG tablet, One twice daily for congestion (Patient taking differently: 1 tablet daily. One twice daily for congestion), Disp: 20 tablet, Rfl: 1   ESTERIFIED ESTROGENS PO, Take by mouth., Disp: , Rfl:    estradiol  (VIVELLE -DOT) 0.05 MG/24HR patch, Place 1 patch onto the skin 2 (two) times a week., Disp: , Rfl:    fluconazole (DIFLUCAN) 150 MG tablet, 1 po stat, repeat x 1 in 48 hours, Disp: , Rfl:    magnesium gluconate (MAGONATE) 500 MG tablet, Take 500 mg by mouth 2 (two) times daily., Disp: , Rfl:    meloxicam  (MOBIC ) 15 MG tablet, Take 1 tablet (15 mg total) by mouth daily as needed for pain., Disp: 90 tablet, Rfl: 1   meloxicam  (MOBIC ) 15 MG tablet, Take 1 tablet (15 mg total) by mouth daily., Disp: 30 tablet, Rfl: 0   Multiple Vitamin (MULTI VITAMIN DAILY PO), , Disp: , Rfl:  Multiple Vitamins-Minerals (MULTI COMPLETE PO), Take by mouth., Disp: , Rfl:    Progesterone  Micronized (PROGESTERONE  PO), Take by mouth., Disp: , Rfl:    valACYclovir  (VALTREX ) 500 MG tablet, TAKE 1 TABLET BY MOUTH TWICE DAILY AS NEEDED FOR 5 DAYS AT FIRST SIGNS OF SYMPTOMS, Disp: 30 tablet, Rfl: 1  Current Facility-Administered Medications:    methylPREDNISolone  acetate (DEPO-MEDROL ) injection 80 mg, 80 mg, Intramuscular, Once, Meccariello, Con PARAS, MD   Objective:     Vitals:    10/25/23 1117  Pulse: 75  SpO2: 97%  Weight: 140 lb (63.5 kg)  Height: 5' 4 (1.626 m)      Body mass index is 24.03 kg/m.    Physical Exam:    General:  awake, alert oriented, no acute distress nontoxic Skin: no suspicious lesions or rashes Neuro:sensation intact and strength 5/5 with no deficits, no atrophy, normal muscle tone Psych: No signs of anxiety, depression or other mood disorder   Right knee: No swelling No deformity Neg fluid wave, joint milking ROM Flex 110, Ext 0 TTP posterior fossa, medial and lateral gastroc, medial tibial shaft, anterior shin musculature NTTP over the quad tendon, medial fem condyle, lat fem condyle, patella, plica, patella tendon, tibial tuberostiy, fibular head,  , pes anserine bursa, gerdy's tubercle, medial jt line, lateral jt line Neg anterior and posterior drawer Neg lachman Neg sag sign Negative varus stress Negative valgus stress Negative McMurray Negative Thessaly No pain with resisted knee flexion or extension or ankle dorsiflexion.  Mild discomfort through calf with resisted foot plantarflexion Gait normal     Electronically signed by:  Odis Mace D[facility] 12:00 PM 10/25/23

## 2023-10-25 ENCOUNTER — Ambulatory Visit: Admitting: Sports Medicine

## 2023-10-25 VITALS — HR 75 | Ht 64.0 in | Wt 140.0 lb

## 2023-10-25 DIAGNOSIS — M25561 Pain in right knee: Secondary | ICD-10-CM | POA: Diagnosis not present

## 2023-10-25 DIAGNOSIS — M79661 Pain in right lower leg: Secondary | ICD-10-CM | POA: Diagnosis not present

## 2023-10-25 DIAGNOSIS — S83206A Unspecified tear of unspecified meniscus, current injury, right knee, initial encounter: Secondary | ICD-10-CM | POA: Diagnosis not present

## 2023-10-25 DIAGNOSIS — G8929 Other chronic pain: Secondary | ICD-10-CM | POA: Diagnosis not present

## 2023-10-25 NOTE — Patient Instructions (Signed)
 Ortho referral   Activity as tolerated   As needed follow up

## 2023-11-13 ENCOUNTER — Ambulatory Visit: Admitting: Family Medicine

## 2023-11-28 NOTE — Progress Notes (Unsigned)
 [facility] [address] [place] [address] Phone: [phone] Subjective:   Patricia Lloyd, am serving as a scribe for Dr. Arthea Claudene.  I'm seeing this patient by the request  of:  Patricia Clotilda SAUNDERS, MD  CC: Shoulder and knee pain  YEP:Dlagzrupcz  09/11/2023 Discussed with patient at great length and discussed the next up would be potential MRI. Patient does not want any surgical intervention. Would like to hold on anything at this moment. Has had surgery on it previously. Last injection note was quite sometime ago but we will continue to monitor. Follow-up again in 2 to 3 months  Continues to be an area of concern.  Discussed potential injections if needed.  Feels like she is doing relatively well though.  No other significant changes.  Will continue to monitor.  Follow-up again in 2 to 3 months did discuss the possibility of a Tru pull lite brace which I think will be beneficial to help her with the stability of the kneecap.     Stable at the moment.  No other changes.  Held on any manipulation today.     Patient does have a positive squeeze test today.  We discussed that the right for the orthotics.  Worsening pain will need to consider injections.  Last talk about it about this time a year ago.  Patient is in agreement with the plan.     Updated 11/29/2023 Patricia Lloyd is a 63 y.o. female coming in with complaint of shoulder and knee pain. Has seen Dr. Leonce and referred to orthopedic surgery to discuss arthroscopic surgery. Knee pain still here.       Past Medical History:  Diagnosis Date   Fibromyalgia    H/O fatigue    Insomnia    PONV (postoperative nausea and vomiting)    severe   Past Surgical History:  Procedure Laterality Date   CESAREAN SECTION  1995   CESAREAN SECTION  2000   DILATATION & CURETTAGE/HYSTEROSCOPY WITH TRUECLEAR N/A 01/01/2013   Procedure: DILATATION & CURETTAGE/HYSTEROSCOPY WITH TRUCLEAR;  Surgeon:  Charlie CHRISTELLA Croak, MD;  Location: [facility];  Service: Gynecology;  Laterality: N/A;   SHOULDER SURGERY  05/03/2010   Left shoulder surgery, Dr.Doldorf     TMJ ARTHROPLASTY  1990   Social History   Socioeconomic History   Marital status: Single    Spouse name: Not on file   Number of children: 2   Years of education: COLLEGE   Highest education level: Not on file  Occupational History   Occupation: Dress [employer]    Employer: [employer]  Tobacco Use   Smoking status: Former   Smokeless tobacco: Never   Tobacco comments:    Social smoker quit at age 63  Substance and Sexual Activity   Alcohol use: Yes    Alcohol/week: 0.0 standard drinks of alcohol    Comment: 2 GLASSES WINE DAILY    Drug use: No   Sexual activity: Not on file  Other Topics Concern   Not on file  Social History Narrative   Lives at home w/ her fiance    Right-handed   Caffeine: 1 cup of coffee per day   Social Drivers of Corporate Investment Banker Strain: Not on file  Food Insecurity: Not on file  Transportation Needs: Not on file  Physical Activity: Not on file  Stress: Not on file  Social Connections: Not on file   No Known Allergies Family  History  Problem Relation Age of Onset   Cancer Mother        Pancreatic    Hypertension Father    Cancer Maternal Aunt     Current Outpatient Medications (Endocrine & Metabolic):    ESTERIFIED ESTROGENS PO, Take by mouth.   estradiol  (VIVELLE -DOT) 0.05 MG/24HR patch, Place 1 patch onto the skin 2 (two) times a week.   Progesterone  Micronized (PROGESTERONE  PO), Take by mouth.  Current Facility-Administered Medications (Endocrine & Metabolic):    methylPREDNISolone  acetate (DEPO-MEDROL ) injection 80 mg    Current Outpatient Medications (Respiratory):    cetirizine -pseudoephedrine  (ZYRTEC -D) 5-120 MG tablet, One twice daily for congestion (Patient taking differently: 1 tablet daily. One twice daily for congestion)   Current Outpatient Medications  (Analgesics):    celecoxib  (CELEBREX ) 200 MG capsule, Take 1 capsule (200 mg total) by mouth 2 (two) times daily.   meloxicam  (MOBIC ) 15 MG tablet, Take 1 tablet (15 mg total) by mouth daily as needed for pain.   meloxicam  (MOBIC ) 15 MG tablet, Take 1 tablet (15 mg total) by mouth daily.     Current Outpatient Medications (Other):    B Complex Vitamins (VITAMIN B-COMPLEX) TABS, Take by mouth.   Biotin 1000 MCG tablet, Take by mouth.   fluconazole (DIFLUCAN) 150 MG tablet, 1 po stat, repeat x 1 in 48 hours   magnesium gluconate (MAGONATE) 500 MG tablet, Take 500 mg by mouth 2 (two) times daily.   Multiple Vitamin (MULTI VITAMIN DAILY PO),    Multiple Vitamins-Minerals (MULTI COMPLETE PO), Take by mouth.   valACYclovir  (VALTREX ) 500 MG tablet, TAKE 1 TABLET BY MOUTH TWICE DAILY AS NEEDED FOR 5 DAYS AT FIRST SIGNS OF SYMPTOMS    Reviewed prior external information including notes and imaging from  primary care provider As well as notes that were available from care everywhere and other healthcare systems.  Past medical history, social, surgical and family history all reviewed in electronic medical record.  No pertanent information unless stated regarding to the chief complaint.   Review of Systems:  No headache, visual changes, nausea, vomiting, diarrhea, constipation, dizziness, abdominal pain, skin rash, fevers, chills, night sweats, weight loss, swollen lymph nodes, body aches, joint swelling, chest pain, shortness of breath, mood changes. POSITIVE muscle aches  Objective  Blood pressure 118/80, pulse 74, height 5' 4 (1.626 m), last menstrual period 12/12/2012, SpO2 99%.   General: No apparent distress alert and oriented x3 mood and affect normal, dressed appropriately.  HEENT: Pupils equal, extraocular movements intact  Respiratory: Patient's speak in full sentences and does not appear short of breath  Cardiovascular: No lower extremity edema, non tender, no erythema  Knee exam  shows patient does have tenderness to palpation over the medial joint line of the right knee.  Patient does still have some crepitus noted. Neck exam does have some mild loss of lordosis and some degenerative scoliosis.  Patient does have some tightness noted in the lower back.  Osteopathic findings C2 flexed rotated and side bent right C4 flexed rotated and side bent left C6 flexed rotated and side bent left T3 extended rotated and side bent right inhaled third rib T9 extended rotated and side bent left L2 flexed rotated and side bent right Sacrum right on right    Impression and Recommendations:  Lumbar radiculopathy Stable at the moment, continue with the core strengthening, discussed icing regimen, increase activity slowly.  Follow-up again in 6 to 8 weeks  Patellofemoral arthritis of right knee Known arthritis and  meniscal tear.  Patient is going to follow-up with a orthopedic surgeon to discuss her options for the possibility of arthroscopic procedure.    Decision today to treat with OMT was based on Physical Exam  After verbal consent patient was treated with HVLA, ME, FPR techniques in cervical, thoracic, rib, lumbar and sacral areas, all areas are chronic   Patient tolerated the procedure well with improvement in symptoms  Patient given exercises, stretches and lifestyle modifications  See medications in patient instructions if given  Patient will follow up in 4-8 weeks   The above documentation has been reviewed and is accurate and complete Velicia Dejager M Daniell Paradise, DO

## 2023-11-29 ENCOUNTER — Ambulatory Visit

## 2023-11-29 ENCOUNTER — Encounter: Payer: Self-pay | Admitting: Family Medicine

## 2023-11-29 ENCOUNTER — Ambulatory Visit: Admitting: Family Medicine

## 2023-11-29 VITALS — BP 118/80 | HR 74 | Ht 64.0 in

## 2023-11-29 DIAGNOSIS — G8929 Other chronic pain: Secondary | ICD-10-CM | POA: Diagnosis not present

## 2023-11-29 DIAGNOSIS — M1711 Unilateral primary osteoarthritis, right knee: Secondary | ICD-10-CM

## 2023-11-29 DIAGNOSIS — M9908 Segmental and somatic dysfunction of rib cage: Secondary | ICD-10-CM | POA: Diagnosis not present

## 2023-11-29 DIAGNOSIS — M5412 Radiculopathy, cervical region: Secondary | ICD-10-CM | POA: Diagnosis not present

## 2023-11-29 DIAGNOSIS — M9901 Segmental and somatic dysfunction of cervical region: Secondary | ICD-10-CM | POA: Diagnosis not present

## 2023-11-29 DIAGNOSIS — M25512 Pain in left shoulder: Secondary | ICD-10-CM | POA: Diagnosis not present

## 2023-11-29 DIAGNOSIS — M9902 Segmental and somatic dysfunction of thoracic region: Secondary | ICD-10-CM | POA: Diagnosis not present

## 2023-11-29 DIAGNOSIS — M9904 Segmental and somatic dysfunction of sacral region: Secondary | ICD-10-CM

## 2023-11-29 DIAGNOSIS — M5416 Radiculopathy, lumbar region: Secondary | ICD-10-CM

## 2023-11-29 DIAGNOSIS — M9903 Segmental and somatic dysfunction of lumbar region: Secondary | ICD-10-CM

## 2023-11-29 NOTE — Assessment & Plan Note (Signed)
 Stable at the moment, continue with the core strengthening, discussed icing regimen, increase activity slowly.  Follow-up again in 6 to 8 weeks

## 2023-11-29 NOTE — Assessment & Plan Note (Signed)
 Known arthritis and meniscal tear.  Patient is going to follow-up with a orthopedic surgeon to discuss her options for the possibility of arthroscopic procedure.

## 2023-11-29 NOTE — Assessment & Plan Note (Signed)
 Likely no radicular symptoms, continue to be active.  Discussed icing regimen and home exercises, increase activity slowly.  Patient should do well with conservative therapy.  Follow-up again in 6 to 8 weeks

## 2023-12-05 ENCOUNTER — Ambulatory Visit: Payer: Self-pay | Admitting: Family Medicine

## 2024-02-01 ENCOUNTER — Ambulatory Visit (INDEPENDENT_AMBULATORY_CARE_PROVIDER_SITE_OTHER): Admitting: Family Medicine

## 2024-02-01 DIAGNOSIS — J40 Bronchitis, not specified as acute or chronic: Secondary | ICD-10-CM | POA: Diagnosis not present

## 2024-02-01 DIAGNOSIS — R051 Acute cough: Secondary | ICD-10-CM | POA: Diagnosis not present

## 2024-02-01 DIAGNOSIS — R42 Dizziness and giddiness: Secondary | ICD-10-CM | POA: Diagnosis not present

## 2024-02-01 MED ORDER — BENZONATATE 200 MG PO CAPS: 200.0000 mg | ORAL_CAPSULE | Freq: Two times a day (BID) | ORAL | 0 refills | Status: AC | PRN

## 2024-02-01 MED ORDER — GUAIFENESIN-DM 100-10 MG/5ML PO SYRP: 5.0000 mL | ORAL_SOLUTION | ORAL | 0 refills | Status: AC | PRN

## 2024-02-01 MED ORDER — ALBUTEROL SULFATE HFA 108 (90 BASE) MCG/ACT IN AERS: 2.0000 | INHALATION_SPRAY | Freq: Four times a day (QID) | RESPIRATORY_TRACT | 2 refills | Status: AC | PRN

## 2024-02-01 NOTE — Progress Notes (Signed)
 "  Established Patient Office Visit   Subjective  Patient ID: Patricia Lloyd, female    DOB: Jan 09, 1961  Age: 64 y.o. MRN: [id number]  Chief Complaint  Patient presents with   Acute Visit    Patient came in today for a Cough, started 2 weeks ago, clear Mucus, patient is unable to sleep at night because of the cough     Pt is a 64 yo female seen for acute concern.  Patient Dors cough x 2 weeks.  Seen at [facility].  Given Tessalon  100 mg which works okay.  Coughing up clear mucus.  Cough worse at night.  Endorses some postnasal drainage.  Denies fever, chills, headaches, nausea, vomiting.  Pt notes random dizzy spells that last a few sec. 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  Inquires what may be causing.  Does not recall any contributing factors. Not like episodes of vertigo in the past.  Pt notes she may not be eating or drinking enough during the day.  Mouth dry.    Patient Active Problem List   Diagnosis Date Noted   Patellofemoral arthritis of right knee 06/28/2023   Ruptured Bakers cyst 03/09/2023   Aortic root enlargement 12/14/2022   Somatic dysfunction of spine, sacral 10/17/2022   Cervical disc disorder with radiculopathy of cervical region 04/27/2021   Lumbar radiculopathy 04/27/2021   Dyslipidemia 03/02/2021   Fracture of one rib of left side with delayed healing 02/19/2020   Lateral pain of left hip 07/15/2019   Precordial chest pain 01/31/2019   Palpitation 01/30/2019   Educated about COVID-19 virus infection 01/30/2019   Urinary incontinence 01/20/2019   Loss of transverse plantar arch of right foot 07/02/2018   Peroneal tendinitis, right 07/02/2018   Morton neuroma, right 07/02/2018   Sinusitis, acute 04/21/2015   Cervical radiculitis 09/03/2014   Left arm pain 09/02/2014   Strain of right tibialis anterior muscle 06/24/2014   Tendonitis of left rotator cuff 06/24/2014   Fibromyalgia 06/10/2014   Varicose veins of bilateral lower extremities  with other complications 01/28/2013   Leg edema, right 01/28/2013   Dizziness and giddiness 12/25/2012   Past Medical History:  Diagnosis Date   Allergy    Arthritis    Fibromyalgia    H/O fatigue    Insomnia    PONV (postoperative nausea and vomiting)    severe   Past Surgical History:  Procedure Laterality Date   CESAREAN SECTION  01/09/1993   CESAREAN SECTION  01/09/1998   COSMETIC SURGERY  2018   DILATATION & CURETTAGE/HYSTEROSCOPY WITH TRUECLEAR N/A 01/01/2013   Procedure: DILATATION & CURETTAGE/HYSTEROSCOPY WITH TRUCLEAR;  Surgeon: Charlie CHRISTELLA Croak, MD;  Location: [facility];  Service: Gynecology;  Laterality: N/A;   SHOULDER SURGERY  05/03/2010   Left shoulder surgery, Dr.Doldorf     TMJ ARTHROPLASTY  01/10/1988   Social History[1] Family History  Problem Relation Age of Onset   Cancer Mother        Pancreatic    Hypertension Father    Anxiety disorder Father    Depression Father    Cancer Maternal Aunt    ADD / ADHD Sister    Arthritis Sister    Allergies[2]  ROS Negative unless stated above    Objective:     LMP 12/12/2012  BP Readings from Last 3 Encounters:  11/29/23 118/80  09/11/23 124/82  06/28/23 112/80   Wt Readings from Last 3 Encounters:  10/25/23 140 lb (63.5 kg)  09/27/23 144 lb (  65.3 kg)  06/28/23 144 lb (65.3 kg)      Physical Exam Constitutional:      General: She is not in acute distress.    Appearance: Normal appearance.  HENT:     Head: Normocephalic and atraumatic.     Nose: Nose normal.     Mouth/Throat:     Mouth: Mucous membranes are moist.  Cardiovascular:     Rate and Rhythm: Normal rate and regular rhythm.     Heart sounds: Normal heart sounds. No murmur heard.    No gallop.  Pulmonary:     Effort: Pulmonary effort is normal. No respiratory distress.     Breath sounds: Examination of the right-lower field reveals wheezing. Wheezing present. No rhonchi or rales.     Comments: Occasional cough, dry.  Faint wheezes  in RLL. Skin:    General: Skin is warm and dry.  Neurological:     Mental Status: She is alert and oriented to person, place, and time.        09/18/2022   10:27 AM 12/21/2021    3:25 PM 09/17/2020    9:48 AM  Depression screen PHQ 2/9  Decreased Interest 2 2 0  Down, Depressed, Hopeless 1 0 0  PHQ - 2 Score 3 2 0  Altered sleeping 0 2 3  Tired, decreased energy 3 3 3   Change in appetite 0 0 1  Feeling bad or failure about yourself  1 0 0  Trouble concentrating 2 2 0  Moving slowly or fidgety/restless 0 0 0  Suicidal thoughts 0 0 0  PHQ-9 Score 9  9  7    Difficult doing work/chores Somewhat difficult Somewhat difficult Somewhat difficult     Data saved with a previous flowsheet row definition      09/18/2022   10:28 AM 09/17/2020    9:49 AM  GAD 7 : Generalized Anxiety Score  Nervous, Anxious, on Edge 1  1   Control/stop worrying 1  0   Worry too much - different things 1  0   Trouble relaxing 2  1   Restless 0  0   Easily annoyed or irritable 1  1   Afraid - awful might happen 0  0   Total GAD 7 Score 6 3  Anxiety Difficulty  Not difficult at all     Data saved with a previous flowsheet row definition     No results found for any visits on 02/01/24.    Assessment & Plan:   Bronchitis -     Benzonatate ; Take 1 capsule (200 mg total) by mouth 2 (two) times daily as needed for cough.  Dispense: 20 capsule; Refill: 0 -     Albuterol Sulfate HFA; Inhale 2 puffs into the lungs every 6 (six) hours as needed for wheezing or shortness of breath.  Dispense: 8 g; Refill: 2 -     guaiFENesin-DM; Take 5 mLs by mouth every 4 (four) hours as needed for cough.  Dispense: 118 mL; Refill: 0  Acute cough -     Benzonatate ; Take 1 capsule (200 mg total) by mouth 2 (two) times daily as needed for cough.  Dispense: 20 capsule; Refill: 0 -     guaiFENesin-DM; Take 5 mLs by mouth every 4 (four) hours as needed for cough.  Dispense: 118 mL; Refill: 0  Episodic lightheadedness   Acute  bronchitis, likely viral.  Supportive care.  Flonase  for postnasal drainage.  Tessalon  refilled.  Guaifenesin-DM at bedtime prn.  Albuterol inhaler prn for wheezing, SOB.  Given strict precautions for worsened symptoms.  Pt to increase hydration as may be contributing to episodic lightheadedness.  Keep a diary of sx.  Less likely vertigo due to quick resolution and not noted with position change.  Return if symptoms worsen or fail to improve.   Clotilda JONELLE Single, MD     [1]  Social History Tobacco Use   Smoking status: Former   Smokeless tobacco: Never   Tobacco comments:    Social smoker quit at age 70  Substance Use Topics   Alcohol use: Yes    Alcohol/week: 0.0 standard drinks of alcohol    Comment: 2 GLASSES WINE DAILY    Drug use: No  [2] No Known Allergies  "
